# Patient Record
Sex: Female | Born: 1935 | Race: White | Hispanic: No | Smoking: Former smoker
Health system: Southern US, Community
[De-identification: ages and names within clinical notes are randomized; demographics above are authoritative.]

## PROBLEM LIST (undated history)

## (undated) DIAGNOSIS — H9191 Unspecified hearing loss, right ear: Secondary | ICD-10-CM

## (undated) DIAGNOSIS — M5030 Other cervical disc degeneration, unspecified cervical region: Secondary | ICD-10-CM

## (undated) DIAGNOSIS — R112 Nausea with vomiting, unspecified: Secondary | ICD-10-CM

## (undated) DIAGNOSIS — Z8719 Personal history of other diseases of the digestive system: Secondary | ICD-10-CM

## (undated) DIAGNOSIS — C801 Malignant (primary) neoplasm, unspecified: Secondary | ICD-10-CM

## (undated) DIAGNOSIS — E119 Type 2 diabetes mellitus without complications: Secondary | ICD-10-CM

## (undated) DIAGNOSIS — R42 Dizziness and giddiness: Secondary | ICD-10-CM

## (undated) DIAGNOSIS — F419 Anxiety disorder, unspecified: Secondary | ICD-10-CM

## (undated) DIAGNOSIS — K219 Gastro-esophageal reflux disease without esophagitis: Secondary | ICD-10-CM

## (undated) DIAGNOSIS — J449 Chronic obstructive pulmonary disease, unspecified: Secondary | ICD-10-CM

## (undated) DIAGNOSIS — M1990 Unspecified osteoarthritis, unspecified site: Secondary | ICD-10-CM

## (undated) DIAGNOSIS — Z9889 Other specified postprocedural states: Secondary | ICD-10-CM

## (undated) DIAGNOSIS — F329 Major depressive disorder, single episode, unspecified: Secondary | ICD-10-CM

## (undated) DIAGNOSIS — Z972 Presence of dental prosthetic device (complete) (partial): Secondary | ICD-10-CM

## (undated) DIAGNOSIS — F32A Depression, unspecified: Secondary | ICD-10-CM

## (undated) DIAGNOSIS — I10 Essential (primary) hypertension: Secondary | ICD-10-CM

## (undated) DIAGNOSIS — G709 Myoneural disorder, unspecified: Secondary | ICD-10-CM

## (undated) DIAGNOSIS — I509 Heart failure, unspecified: Secondary | ICD-10-CM

## (undated) DIAGNOSIS — E7800 Pure hypercholesterolemia, unspecified: Secondary | ICD-10-CM

## (undated) DIAGNOSIS — IMO0001 Reserved for inherently not codable concepts without codable children: Secondary | ICD-10-CM

## (undated) HISTORY — PX: SHOULDER SURGERY: SHX246

## (undated) HISTORY — PX: BACK SURGERY: SHX140

## (undated) HISTORY — PX: CHOLECYSTECTOMY: SHX55

## (undated) HISTORY — PX: APPENDECTOMY: SHX54

## (undated) HISTORY — PX: JOINT REPLACEMENT: SHX530

---

## 1961-06-14 HISTORY — PX: ABDOMINAL HYSTERECTOMY: SHX81

## 2000-01-21 ENCOUNTER — Encounter: Payer: Self-pay | Admitting: Neurosurgery

## 2000-01-26 ENCOUNTER — Inpatient Hospital Stay (HOSPITAL_COMMUNITY): Admission: RE | Admit: 2000-01-26 | Discharge: 2000-01-27 | Payer: Self-pay | Admitting: Neurosurgery

## 2000-01-26 ENCOUNTER — Encounter: Payer: Self-pay | Admitting: Neurosurgery

## 2000-03-21 ENCOUNTER — Encounter: Payer: Self-pay | Admitting: Neurosurgery

## 2000-03-21 ENCOUNTER — Encounter: Admission: RE | Admit: 2000-03-21 | Discharge: 2000-03-21 | Payer: Self-pay | Admitting: Neurosurgery

## 2000-07-05 ENCOUNTER — Inpatient Hospital Stay (HOSPITAL_COMMUNITY): Admission: RE | Admit: 2000-07-05 | Discharge: 2000-07-07 | Payer: Self-pay | Admitting: Neurosurgery

## 2000-07-05 ENCOUNTER — Encounter: Payer: Self-pay | Admitting: Neurosurgery

## 2000-08-08 ENCOUNTER — Encounter: Admission: RE | Admit: 2000-08-08 | Discharge: 2000-08-08 | Payer: Self-pay | Admitting: Neurosurgery

## 2000-08-08 ENCOUNTER — Encounter: Payer: Self-pay | Admitting: Neurosurgery

## 2001-02-08 ENCOUNTER — Other Ambulatory Visit: Admission: RE | Admit: 2001-02-08 | Discharge: 2001-02-08 | Payer: Self-pay | Admitting: Family Medicine

## 2004-07-09 ENCOUNTER — Ambulatory Visit: Payer: Self-pay | Admitting: Unknown Physician Specialty

## 2004-08-05 ENCOUNTER — Ambulatory Visit: Payer: Self-pay | Admitting: Orthopedic Surgery

## 2004-10-09 ENCOUNTER — Ambulatory Visit: Payer: Self-pay | Admitting: Unknown Physician Specialty

## 2004-12-28 ENCOUNTER — Ambulatory Visit: Payer: Self-pay | Admitting: Gastroenterology

## 2005-01-01 ENCOUNTER — Ambulatory Visit: Payer: Self-pay | Admitting: Gastroenterology

## 2005-02-11 ENCOUNTER — Ambulatory Visit: Payer: Self-pay | Admitting: Orthopedic Surgery

## 2005-02-11 ENCOUNTER — Other Ambulatory Visit: Payer: Self-pay

## 2005-02-17 ENCOUNTER — Ambulatory Visit: Payer: Self-pay | Admitting: Orthopedic Surgery

## 2005-06-02 ENCOUNTER — Ambulatory Visit: Payer: Self-pay | Admitting: Family Medicine

## 2005-07-24 ENCOUNTER — Emergency Department: Payer: Self-pay | Admitting: Emergency Medicine

## 2005-07-24 ENCOUNTER — Other Ambulatory Visit: Payer: Self-pay

## 2006-02-08 ENCOUNTER — Ambulatory Visit: Payer: Self-pay | Admitting: Gastroenterology

## 2006-03-04 ENCOUNTER — Ambulatory Visit: Payer: Self-pay | Admitting: Surgery

## 2006-03-11 ENCOUNTER — Inpatient Hospital Stay: Payer: Self-pay | Admitting: Surgery

## 2006-06-03 ENCOUNTER — Ambulatory Visit: Payer: Self-pay | Admitting: Unknown Physician Specialty

## 2006-09-12 ENCOUNTER — Ambulatory Visit: Payer: Self-pay | Admitting: Orthopedic Surgery

## 2006-09-30 ENCOUNTER — Ambulatory Visit: Payer: Self-pay | Admitting: Gastroenterology

## 2006-10-04 ENCOUNTER — Emergency Department: Payer: Self-pay | Admitting: General Practice

## 2006-10-24 ENCOUNTER — Ambulatory Visit: Payer: Self-pay | Admitting: Orthopedic Surgery

## 2006-10-24 ENCOUNTER — Other Ambulatory Visit: Payer: Self-pay

## 2006-11-04 ENCOUNTER — Ambulatory Visit (HOSPITAL_COMMUNITY): Admission: RE | Admit: 2006-11-04 | Discharge: 2006-11-04 | Payer: Self-pay | Admitting: *Deleted

## 2006-11-09 ENCOUNTER — Inpatient Hospital Stay: Payer: Self-pay | Admitting: Orthopedic Surgery

## 2006-11-30 ENCOUNTER — Ambulatory Visit: Payer: Self-pay | Admitting: Orthopedic Surgery

## 2007-06-20 ENCOUNTER — Ambulatory Visit: Payer: Self-pay | Admitting: Family Medicine

## 2008-06-20 ENCOUNTER — Ambulatory Visit: Payer: Self-pay | Admitting: Otolaryngology

## 2009-05-21 ENCOUNTER — Inpatient Hospital Stay (HOSPITAL_COMMUNITY): Admission: RE | Admit: 2009-05-21 | Discharge: 2009-05-24 | Payer: Self-pay | Admitting: Orthopedic Surgery

## 2009-09-03 ENCOUNTER — Encounter: Admission: RE | Admit: 2009-09-03 | Discharge: 2009-09-03 | Payer: Self-pay | Admitting: Orthopedic Surgery

## 2010-03-31 ENCOUNTER — Ambulatory Visit: Payer: Self-pay

## 2010-04-24 ENCOUNTER — Ambulatory Visit: Payer: Self-pay | Admitting: Unknown Physician Specialty

## 2010-05-08 ENCOUNTER — Ambulatory Visit: Payer: Self-pay | Admitting: Specialist

## 2010-05-18 ENCOUNTER — Ambulatory Visit: Payer: Self-pay | Admitting: Specialist

## 2010-05-20 ENCOUNTER — Ambulatory Visit: Payer: Self-pay | Admitting: Specialist

## 2010-07-03 ENCOUNTER — Emergency Department: Payer: Self-pay | Admitting: Emergency Medicine

## 2010-09-15 LAB — BASIC METABOLIC PANEL
BUN: 9 mg/dL (ref 6–23)
Calcium: 8 mg/dL — ABNORMAL LOW (ref 8.4–10.5)
Calcium: 8.4 mg/dL (ref 8.4–10.5)
Calcium: 8.6 mg/dL (ref 8.4–10.5)
Chloride: 101 mEq/L (ref 96–112)
Creatinine, Ser: 0.78 mg/dL (ref 0.4–1.2)
Creatinine, Ser: 0.89 mg/dL (ref 0.4–1.2)
GFR calc Af Amer: >60 mL/min (ref >60)
GFR calc Af Amer: >60 mL/min (ref >60)
GFR calc non Af Amer: >60 mL/min (ref >60)
GFR calc non Af Amer: >60 mL/min (ref >60)
GFR calc non Af Amer: >60 mL/min (ref >60)
Glucose, Bld: 135 mg/dL — ABNORMAL HIGH (ref 70–99)
Potassium: 3.9 mEq/L (ref 3.5–5.1)
Sodium: 137 mEq/L (ref 135–145)
Sodium: 138 mEq/L (ref 135–145)

## 2010-09-15 LAB — CBC
HCT: 32.3 % — ABNORMAL LOW (ref 36.0–46.0)
HCT: 43.3 % (ref 36.0–46.0)
Hemoglobin: 10.6 g/dL — ABNORMAL LOW (ref 12.0–15.0)
Hemoglobin: 10.7 g/dL — ABNORMAL LOW (ref 12.0–15.0)
Hemoglobin: 13.8 g/dL (ref 12.0–15.0)
Platelets: 146 10*3/uL — ABNORMAL LOW (ref 150–400)
Platelets: 169 10*3/uL (ref 150–400)
Platelets: 178 10*3/uL (ref 150–400)
RBC: 3.69 MIL/uL — ABNORMAL LOW (ref 3.87–5.11)
RDW: 14.2 % (ref 11.5–15.5)
WBC: 10.8 10*3/uL — ABNORMAL HIGH (ref 4.0–10.5)
WBC: 12.3 10*3/uL — ABNORMAL HIGH (ref 4.0–10.5)
WBC: 9.1 10*3/uL (ref 4.0–10.5)

## 2010-09-15 LAB — PROTIME-INR
INR: 1.11 (ref 0.00–1.49)
INR: 1.21 (ref 0.00–1.49)
INR: 1.45 (ref 0.00–1.49)
INR: 2.01 — ABNORMAL HIGH (ref 0.00–1.49)
Prothrombin Time: 14.2 seconds (ref 11.6–15.2)
Prothrombin Time: 15.2 seconds (ref 11.6–15.2)
Prothrombin Time: 17.5 seconds — ABNORMAL HIGH (ref 11.6–15.2)

## 2010-09-15 LAB — COMPREHENSIVE METABOLIC PANEL
Alkaline Phosphatase: 91 U/L (ref 39–117)
BUN: 15 mg/dL (ref 6–23)
Chloride: 100 mEq/L (ref 96–112)
Glucose, Bld: 103 mg/dL — ABNORMAL HIGH (ref 70–99)
Potassium: 4.7 mEq/L (ref 3.5–5.1)
Total Bilirubin: 0.8 mg/dL (ref 0.3–1.2)

## 2010-09-15 LAB — TYPE AND SCREEN

## 2010-09-15 LAB — URINALYSIS, ROUTINE W REFLEX MICROSCOPIC
Glucose, UA: NEGATIVE mg/dL
Hgb urine dipstick: NEGATIVE
Specific Gravity, Urine: 1.014 (ref 1.005–1.030)
Urobilinogen, UA: 0.2 mg/dL (ref 0.0–1.0)

## 2010-09-15 LAB — URINE MICROSCOPIC-ADD ON

## 2010-10-27 NOTE — Cardiovascular Report (Signed)
[name], [name]               ACCOUNT NO.:  [id number]   MEDICAL RECORD NO.:  [id number]          PATIENT TYPE:  OIB   LOCATION:  2857                         FACILITY:  [facility]   PHYSICIAN:  Darlin Priestly, MD  DATE OF BIRTH:  1933-01-30   DATE OF PROCEDURE:  11/04/2006  DATE OF DISCHARGE:                            CARDIAC CATHETERIZATION   PROCEDURE:  1. Left heart catheterization.  2. Coronary arteriogram.  3. Left ventriculogram.  4. Abdominal aortogram.   COMPLICATIONS:  None.   INDICATIONS:  [name] is a 77 year old female patient of Dr. Sandford Craze and Dr. Kennith Center, with a history of left leg pain, now in need of  left hip replacement.  She did undergo a Cardiolite scan for pre-  operative clearance, which suggested mild anterior ischemia.  She is now  referred for cardiac catheterization to rule out CAD, prior to  proceeding with her hip surgery.   DESCRIPTION OF OPERATION:  After gaining informed written consent, the  patient was brought to the cardiac cath lab.  Right groin was shaved,  prepped and draped in sterile fashion.  ECG monitoring was established.  Using modified Seldinger technique, a #6-French arterial sheath  __________  femoral artery.  A 6-French diagnostic catheters were  performed, diagnostic angiography __________ .   The left main is a large vessel with no significant disease.   The LAD is a large vessel that courses __________  to one diagonal  branch.  The LAD is a tortuous vessel.  There is mild 20% ostial  narrowing.  The remainder of the LAD has no significant disease.   The first diagonal is a small to medium-size vessel with no significant  disease.   The left circumflex is a medium-size vessel which is also a tortuous  vessel that gives off two obtuse marginal branches.  The AV circumflex  has no disease.   First and second OMs are small to medium-size vessels with no  significant  disease.    The right coronary is a  large vessel, was dominant __________  PDA, as  well as posterolateral branch.  There was no significant disease in the  RCA, PD or posterolateral branch.   Left ventriculogram reveals a normal EF at 70%.   Abdominal aortogram reveals no significant renal artery stenosis.   HEMODYNAMIC RESULTS:  Systemic arterial pressure 193/94, LV systemic  pressure 197/18, LVEDP of 28.   CONCLUSION:  1. No significant CAD.  2. Normal LV systolic function.  3. No evidence of renal artery stenosis.  4. Systemic hypertension.  5. Elevated LVEDP.      Darlin Priestly, MD  Electronically Signed     RHM/MEDQ  D:  11/04/2006  T:  11/04/2006  Job:  [id number]   cc:   Karsten Fells

## 2010-10-30 NOTE — Op Note (Signed)
[place]. [facility]  Patient:    [name], [name]                        MRN: [id number] Proc. Date: 07/05/00 Attending:  Clydene Fake, M.D.                           Operative Report  PREOPERATIVE DIAGNOSES: 1. Cervical stenosis. 2. Myelopathy. 3. Left C6 and C7 radiculopathy.  POSTOPERATIVE DIAGNOSIS: 1. Cervical stenosis. 2. Myelopathy. 3. Left C6 and C7 radiculopathy.  OPERATION: 1. Inferior C3 through superior C7 decompressive laminectomy (four levels). 2. C5-C6 and C6-C7 left foraminotomies. 3. Microdissection with microscope. 4. Posterior fusion C4 through C7 (three levels). 5. Segmented posterior instrumentation C4 through C7 (lateral mass screws and    rods). 6. Autograft same incision.  SURGEON:  Clydene Fake, M.D.  ASSISTANT:  Izell [place]ing[place]. Elesa Hacker, M.D.  ANESTHESIA:  General endotracheal anesthesia.  ESTIMATED BLOOD LOSS:  250 cc.  Blood given: None.  DRAINS:  None.  COMPLICATIONS:  None.  REASON FOR PROCEDURE:  The patient is five months status post two-level ACF for myelopathy and cord compression at 4-5 and 5-6, who, a few months after surgery had recurrent left arm pain and was stable in her myelopathy.  MRI was done showing continued cord compression C4 through C6 to 7 with spondylosis at 6-7 and foraminal narrowing still at left 5-6 and at left 6-7.  The patient is brought in for decompression fusion posteriorly.  DESCRIPTION OF PROCEDURE:  The patient is brought to the operating room. General anesthesia was induced.  The patient was placed in a [place] head rest and placed in a prone position  with all pressure points padded.  The patient was prepped and draped in a sterile fashion.  The site of the incision was injected with 10 cc of 1% lidocaine with epinephrine. Incision was then made in the midline of the cervical spine.  Incision was taken from the bottom of C2 through the bottom of C7.  Incision was taken down to  the fascia with care to stay in the midline.  Hemostasis was obtained with Bovie cauterization.  The fascia was then incised, and subperiosteal dissection was done over the C3, 4, 5, 6, and 7 disk space and lamina, and then dissection taken to the facets through these same levels bilaterally. Self-retaining retractor system then placed.  Fluoroscopic images were used to confirm levels.  Microscope was brought onto the field at this point for microdissection foraminotomies.  High-speed drill was used to drill the lamina and the facets of 5-6 and 6-7; 1 and 2 mm Kerrison punches were used to then complete this small laminotomy and medial facetectomy, performing a foraminotomy over the left C6 and left C7 roots.  The vascular membrane over the roots was coagulated with bipolar cauterization and then cut with microscissors, and then we were easily able to pass a nerve hook out the foramen with the nerve root at both levels after this decompression.  After this, attention was then taken to full lamina, and laminectomy was performed with the bottom of C3 removed, all of C4, 5, 6, and the top of C7 removed with Leksell rongeurs and Kerrison punches.  We carefully went down the lateral edges on both sides, decompressing the thecal sac.  Hemostasis obtained with Gelfoam and thrombin in the bone edges and epidural space.  This was then removed.  After the decompression, the microscope was removed from the field.  The facets at 4-5, 5-6, and 6-7 were then prepared by drilling out the facets, removing the cartilage, and using curets to do this also.  Then, the holes for the lateral mass screws were then made by first using an awl to get through the cortex, and then using a drill and drill guide and drilling into the lateral mass at C4, 5, 6, and 7 on the right side and C4, 6, and 7 on the left side.  Due to the foraminotomy, there was not going to be good purchase for a C5 screw on the left.  After  drilling each of the holes, each of the holes were tapped, and then the segmented systems was used and lateral mass screws placed; 14 mm screws were placed to C4 and 12 mm screws placed in the rest.  After the screws were placed, a 60 mm rod was used on both sides.  A slight bend was placed in the left one.  The rods were placed into some screw heads, and nuts were placed over the screws, tightening the rod to the screws.  The tightener was then used again in torque to tighten down the nuts on the rod system.  Prior to placement of the rods in the screw heads, autograft bone was removed from the laminectomy.  It was cleaned and chopped into very small pieces and then packed into each of the facets bilaterally at 4-5, 5-6, and 6-7.  After the rods were placed and nuts tightened, the rest of the bone was placed over the lateral masses lateral to the rods.  Retractors then removed, and hemostasis obtained with bipolar cauterization.  Gelfoam was placed on the dural edges.  The paraspinous muscles and fascia were closed with 0 Vicryl interrupted suture.  The subcutaneous tissue was closed with 0, 2-0 and 3-0 Vicryl interrupted suture, and the skin closed with Steri-Strips.  Dressing was placed.  The patient was placed back into a supine position, had the head band removed, had a soft cervical collar placed, and then was awakened from anesthesia and transferred to the recovery room. DD:  07/05/00 TD:  07/05/00 Job: [id number]

## 2010-10-30 NOTE — Discharge Summary (Signed)
[place]. [facility]  Patient:    [name], [name]                        MRN: [id number] Adm. Date:  07/05/00 Disc. Date: 07/07/00 Attending:  Clydene Fake, M.D.                           Discharge Summary  ADMISSION DIAGNOSIS:  Cervical stenosis causing myelopathy and left C6-7 radiculopathy.  DISCHARGE DIAGNOSIS:  Cervical stenosis causing myelopathy and left C6-7 radiculopathy.  PROCEDURES:  C3-7 decompressive laminectomy with left C5-6 and C6-7 foraminotomies, microdissection with microscope, posterior fusion of C4-7, segmental posterior instrumentation (lateral ______ screws and rods) C4-7, autograft from same incision.  REASON FOR ADMISSION:  The patient is a 77 year old woman who five months ago underwent LACF for cord compression.  She had done well with stable myelopathy and improved radicular symptoms until a couple of months ago when she had left arm pain and came back.  Work-up showed good fusion anteriorly and good anterior decompression of the C4-5 and C5-6 levels, but still cord compression there along with spondylosis at C6-7 and left-sided foraminal narrowing at C5-6 and C6-7.  The patient was brought in for decompression and fusion surgery.  HOSPITAL COURSE:  The patient was admitted on the day of surgery and underwent the procedure named above without complications.  Postoperatively the patient was transferred to the recovery room and then to the floor.  There she was wearing her cervical collar and had some drainage on the dressing.  The incisions otherwise looked good.  She has been up ambulating, but the first day every time she was up, she would get nauseous and actually vomiting.  The morphine PCA was discontinued and these symptoms resolved.  She was ambulating and starting to each better by the second postoperative day.  She did notice minimal pain when lying down.  She had some increased pain if she was ambulating.  There  was also some increased spasm which also caused some achiness into her arms, but not the same type of pain she had down her left arm.  The preoperative back pain has resolved and the numbness she had down her left arm is mildly improved.  DISPOSITION:  The patient will be discharged home in stable condition on July 07, 2000.  DISCHARGE MEDICATIONS:  Same as prehospitalization plus Darvocet-N 100 one to two p.o. q.4-6h. (51 given) and Valium 5 mg p.o. q.6h. p.r.n. spasms.  She also has some Flexeril at home that she can take on a p.r.n. basis.  No NSAIDs for three weeks.  DIET:  As tolerated.  ACTIVITY:  C-collar on at all times.  WOUND CARE:  Keep the incision dry for five days and then may shower.  FOLLOW-UP:  Two weeks in my office. DD:  07/07/00 TD:  07/07/00 Job: [id number]

## 2010-10-30 NOTE — H&P (Signed)
[place]. [facility]  Patient:    [name], [name]                        MRN: [id number] Adm. Date:  01/26/00 Attending:  Clydene Fake, M.D.                         History and Physical  CHIEF COMPLAINT:  Left arm numbness.  HISTORY OF PRESENT ILLNESS:  The patient is a 77 year old woman who for about two months had pain radiating down her left arm toward her middle fingers that has been worsening despite chiropractic care.  She says there is some pain in the interscapular area.  She can bring the numbness on with extension of her neck and bending of her head to the left side.  There are no right-sided symptoms.  She has noticed over the last few months that movement of her hands is getting slower and sloppier, trouble putting jewelry on, but no real change in handwriting, no problems with gait.  MRI of the cervical spine done showing cervical cord stenosis at 4-5 with spondylitic change causing most of the problems there, and at 5-6 some mild stenosis but also left-sided disk herniation with foraminal narrowing.  The patient is being admitted for two-level anterior cervical diskectomy and fusion.  PAST MEDICAL HISTORY:  Significant for increased cholesterol but not on medications for this.  PAST SURGICAL HISTORY:  Hysterectomy.  MEDICATIONS:  Glucosamine, Tylenol.  She has also been taking Voltaren p.r.n.  ALLERGIES:  No known drug allergies.  SOCIAL HISTORY:  She is widowed, does office work.  Quit smoking six months ago.  Does not drink alcohol.  REVIEW OF SYSTEMS:  Negative.  FAMILY HISTORY:  Noncontributory.  PHYSICAL EXAMINATION:  GENERAL:  The patient is alert and oriented x 3, very pleasant.  VITAL SIGNS:  Weight 258.  Height 5 feet 4 inches.  Blood pressure 140/85, pulse 64.  HEENT:  Unremarkable.  LUNGS:  Clear.  HEART:  Regular rate and rhythm.  ABDOMEN:  Soft, nontender.  EXTREMITIES:  Intact, no edema.  NECK AND  NEUROLOGIC EXAM:  The neck extends well in all directions.  There is some tenderness in the left side with some paraspinous muscle spasms on the left.  Positive Spurlings to the left, negative on the right.  Negative Lhermittes.  No brachial plexus tenderness.  There is no pronator drift. Sensation shows some decreased sensation in the left C6 distribution to light touch and pinprick.  Deep tendon reflexes are increased to 3+/4 in the upper extremities, though they are 2-2+/4 in the lower extremities.  No clonus in the lower extremities.  Rapid alternating movements are slow and sloppy.  Gait is fairly normal, but tandem gait showing to about three steps.  Motor strength is intact in all motor groups.  ASSESSMENT:  Patient with myelopathic and radicular symptoms.  Will be admitted for 4-5 and 5-6 anterior cervical diskectomy and fusion. DD:  01/26/00 TD:  01/26/00 Job: [id number]

## 2010-10-30 NOTE — Op Note (Signed)
[place]. [facility]  Patient:    [name]                      MRN: [id number] Proc. Date: 01/26/00 Adm. Date:  [id number] Attending:  Colon Branch                           Operative Report  PREOPERATIVE DIAGNOSIS:  Spondylosis and herniated nucleus pulposus causing myelopathy and radiculopathy at C4-C5 and C5-C6.  POSTOPERATIVE DIAGNOSIS:  Spondylosis and herniated nucleus pulposus causing myelopathy and radiculopathy at C4-C5 and C5-C6.  OPERATION:  Anterior cervical diskectomy and fusion C4-C5 and C5-C6 with allograft anterior cervical plate and tong traction.  SURGEON:  Clydene Fake, M.D.  ASSISTANT:  Izell Grimsley. Elesa Hacker, M.D.  ANESTHESIA: General endotracheal tube anesthesia.  ESTIMATED BLOOD LOSS:  Minimal.  BLOOD GIVEN:  None.  DRAINS:  None.  COMPLICATIONS:  None.  INDICATIONS:  The patient is a 77 year old woman who has had a few month history of neck pain with pain going down her left arm to her thumb and middle fingers with some slow and sloppy movements with her upper extremity.  The MRI showed cord stenosis at L4-L5 due to spondylitic change and disk herniation and left foraminal narrowing at C5-C6.  The patient was brought in for surgery.  DESCRIPTION OF PROCEDURE:  The patient was brought into the operating room and general anesthesia was induced.  The patient had Gardner-Wells placed for traction during the case.  She was then prepped and draped in the sterile fashion.  The site of the incision was injected with 9 cc of 1% lidocaine with epinephrine.  The incision was then made down the left side of the neck from the anterior border of the sternocleidomastoid muscle to the midline. Incision was taken down to the platysma and hemostasis obtained with Bovie cautery. The platysma was incised with the Bovie and then blunt dissection taken down to the anterior cervical fascia to the cervical spine.  A self-retaining ______  was placed in the disk space, and x-ray obtained showing this was the C4-C5 interspace.  The space was then incised with a 15 blade and anterior diskectomy performed with pituitary rongeur.  The longus coli muscle was then reflected laterally on each side after mobilizing with the Bovie over the C4-C5 and C5-C6 interspaces.  The self-retaining retractor system was then placed.  Disk space at C4-C5 and C5-C6 was then incised again with 15 blade and diskectomy performed with pituitary rongeur.  A drill guide was placed over the C4-C5 interspace and a 10 mm Cloward drill was used to drill down through the interspace and end-plate leaving a thinner posterior cortex.  This was then repeated at the C5-C6 level.  At this point, a microscope was brought into the field for microdissection.  The 1 and 2 mm Kerrison punches were used along with curets to finish the diskectomy an remove the thin rim of posterior cortex of the posterior longitudinal ligament and decompress the cord to perform bilateral foraminotomies all under microscopic dissection techniques.  First this was done at the C4-C5 and then the C5-C6 levels.  After this the spinal cord and foramen at both levels were decompressed.  The depth of the vertebral body was measured with the depth gauge and two 12 mm bone dowels were then cut to be a few mm shorter in size. With traction and the Gardner-Wells tongs, the  bone plug was tapped into the C5-C6 space and then this was repeated for the C4-C5 interspace.  Nerve hook was used to fill behind the bone plug making sure there between the dura and the bone plug.  The microscope was removed from the field at this point.  The length from C4 through C6 was measured.  The anterior cervical plate was placed over the cervical spine with two screws placed in the C4 and two in the C6.  X-ray was obtained showing good position of plates, screws and grafts.  The wound was irrigated with antibiotic  solution.  The retractor system was removed. Gelfoam was used for irrigation and then irrigated out.  The platysma was then closed with 3-0 Vicryl interrupted suture.  The subcutaneous tissue was closed with the same and the skin was then closed with Steri-Strips.  Dressing was placed and a soft cervical collar was placed.  The patients head gear had been removed. She was awakened from anesthesia and taken to the recovery room in stable condition. DD:  01/26/00 TD:  01/27/00 Job: [id number]

## 2010-10-30 NOTE — Discharge Summary (Signed)
. [facility]  Patient:    [name], [name]                      MRN: [id number] Adm. Date:  [id number] Disc. Date: [id number] Attending:  Colon Branch                           Discharge Summary  DIAGNOSIS:  Spondylosis and herniated nucleus pulposus at C4-5 and 5-6 with myelopathy and radiculopathy.  PROCEDURE:  Anterior cervical discectomy and fusion C4-5 and 5-6 with Allograft anterior cervical plate and tong traction.  REASON FOR ADMISSION:  The patient is a 77 year old woman who has had a two month history of pain radiating down her left arm toward her middle finger. It has been worsening.  MRI showed cord stenosed and spondylitic change and disc herniation at 5-6 on the left side.  The patient has been worsening in symptoms and no improvement with nonsurgical management.  She is admitted for surgery.  HOSPITAL COURSE:  The patient was admitted on the day of the procedure and underwent the above procedure without complications.  Postoperatively the patient was transferred to the recovery room and onto the floor.  There, she had left arm pain and a brief episode of some numbness into her fingers that resolved but she had significant nausea and emesis throughout the night and into the early morning.  The patient was observed throughout the morning and early afternoon and improved.  She started ambulating, getting around pretty good, able to eat and keep that down. She has had minimal to no pain.  She is not taking any pain medications.  Her incision was clean, dry and intact.  She is wearing a cervical collar.  DISPOSITION:  The patient will be discharged to home in stable condition.  DISCHARGE MEDICATIONS:  These are the same has prehospitalization except no NSAIDs for three weeks and Darvocet-N 100 22 p.o. q.4-6h. p.r.n. 41 given.  DIET:  As tolerated.  WOUND CARE:  Keep incision dry for five days.  Wear C-collar at all times except when  showering.  FOLLOW-UP:  She will follow up in three weeks in my office. DD:  01/27/00 TD:  01/28/00 Job: [id number]

## 2010-10-30 NOTE — H&P (Signed)
[place]. [facility]  Patient:    [name], [name]                        MRN: [id number] Adm. Date:  07/05/00 Attending:  Clydene Fake, M.D.                         History and Physical  CHIEF COMPLAINT:  Left arm pain and numbness.  HISTORY:  The patient is a 77 year old woman who has had a C4-5 and C5-6 ACF done in August 2001, for left arm numbness.  She did great and had much decrease in pain and numbness until December, when she started having some recurrence of neck pain on the left side, with pain over the scapula and into the left arm again.  Steroid dosepak improved some of these symptoms, but a repeat MRI was done, showing a good fusion at C4-5 and C5-6, with no anterior compression, but there was still some canal stenosis with pressure on the spinal cord, due to the congenital narrow canal and the spondylosis at those levels.  At C6-7 there is a disk bulge, but no herniation, along with the canal stenosis, causing some compression.  There probably is also some foraminal stenosis on the left at C5-6 and C6-7, which may be causing her symptoms.  The patient will be brought in for a posterior decompression and fusion.  PAST MEDICAL HISTORY:  Significant for increased cholesterol.  PAST SURGICAL HISTORY: 1. The two-level ACF as mentioned above. 2. Hysterectomy in the past.  CURRENT MEDICATIONS: 1. Glucosamine. 2. Tylenol p.r.n. 3. Voltaren p.r.n. 4. Amitriptyline 50 mg q.h.s. 5. Flexeril p.r.n.  ALLERGIES:  No known drug allergies.  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  She does not drink alcohol.  REVIEW OF SYSTEMS:  Otherwise negative.  FAMILY HISTORY:  Noncontributory.  PHYSICAL EXAMINATION:  GENERAL:  The patient is pleasant, in some mild distress.  HEENT:  Unremarkable.  LUNGS:  Clear.  HEART:  A regular rhythm.  ABDOMEN:  Soft, nontender.  EXTREMITIES:  Intact.   No edema.  NECK/NEUROLOGIC:  Some decreased range of motion, secondary to neck pain.  She has a positive Spurlings to the left, negative to the right.  Some paraspinous muscle spasms noted.  Her _______ movements are slow and sloppy bilaterally. Sensation decreased in the left C7 distribution.  There is no pronator drift.  No brachial plexus tenderness.  Motor strength is intact in all muscle groups, including the left triceps and wrist extensors.  ASSESSMENT/PLAN:  A patient with cervical stenosis and foraminal narrowing.  Will admit for a posterior decompression and fusion of the cervical spine. DD:  07/05/00 TD:  07/05/00 Job: [id number]

## 2010-11-13 ENCOUNTER — Ambulatory Visit: Payer: Self-pay | Admitting: Family Medicine

## 2011-01-06 ENCOUNTER — Other Ambulatory Visit: Payer: Self-pay | Admitting: Neurosurgery

## 2011-01-06 DIAGNOSIS — IMO0002 Reserved for concepts with insufficient information to code with codable children: Secondary | ICD-10-CM

## 2011-01-06 DIAGNOSIS — M545 Low back pain: Secondary | ICD-10-CM

## 2011-01-09 ENCOUNTER — Ambulatory Visit
Admission: RE | Admit: 2011-01-09 | Discharge: 2011-01-09 | Disposition: A | Discharge status: Home / Self Care | Payer: Medicare Other | Source: Ambulatory Visit | Attending: Neurosurgery | Admitting: Neurosurgery

## 2011-01-09 DIAGNOSIS — M545 Low back pain, unspecified: Secondary | ICD-10-CM

## 2011-01-09 DIAGNOSIS — IMO0002 Reserved for concepts with insufficient information to code with codable children: Secondary | ICD-10-CM

## 2011-01-31 IMAGING — CR DG HIP (WITH OR WITHOUT PELVIS) 2-3V*L*
3 series · 3 of 3 positions shown · non-contrast
Comparison: None

CLINICAL DATA: Left hip pain.  History of total left hip
arthroplasty.

LEFT HIP - COMPLETE 2+ VIEW

[t pelvis a.p.]
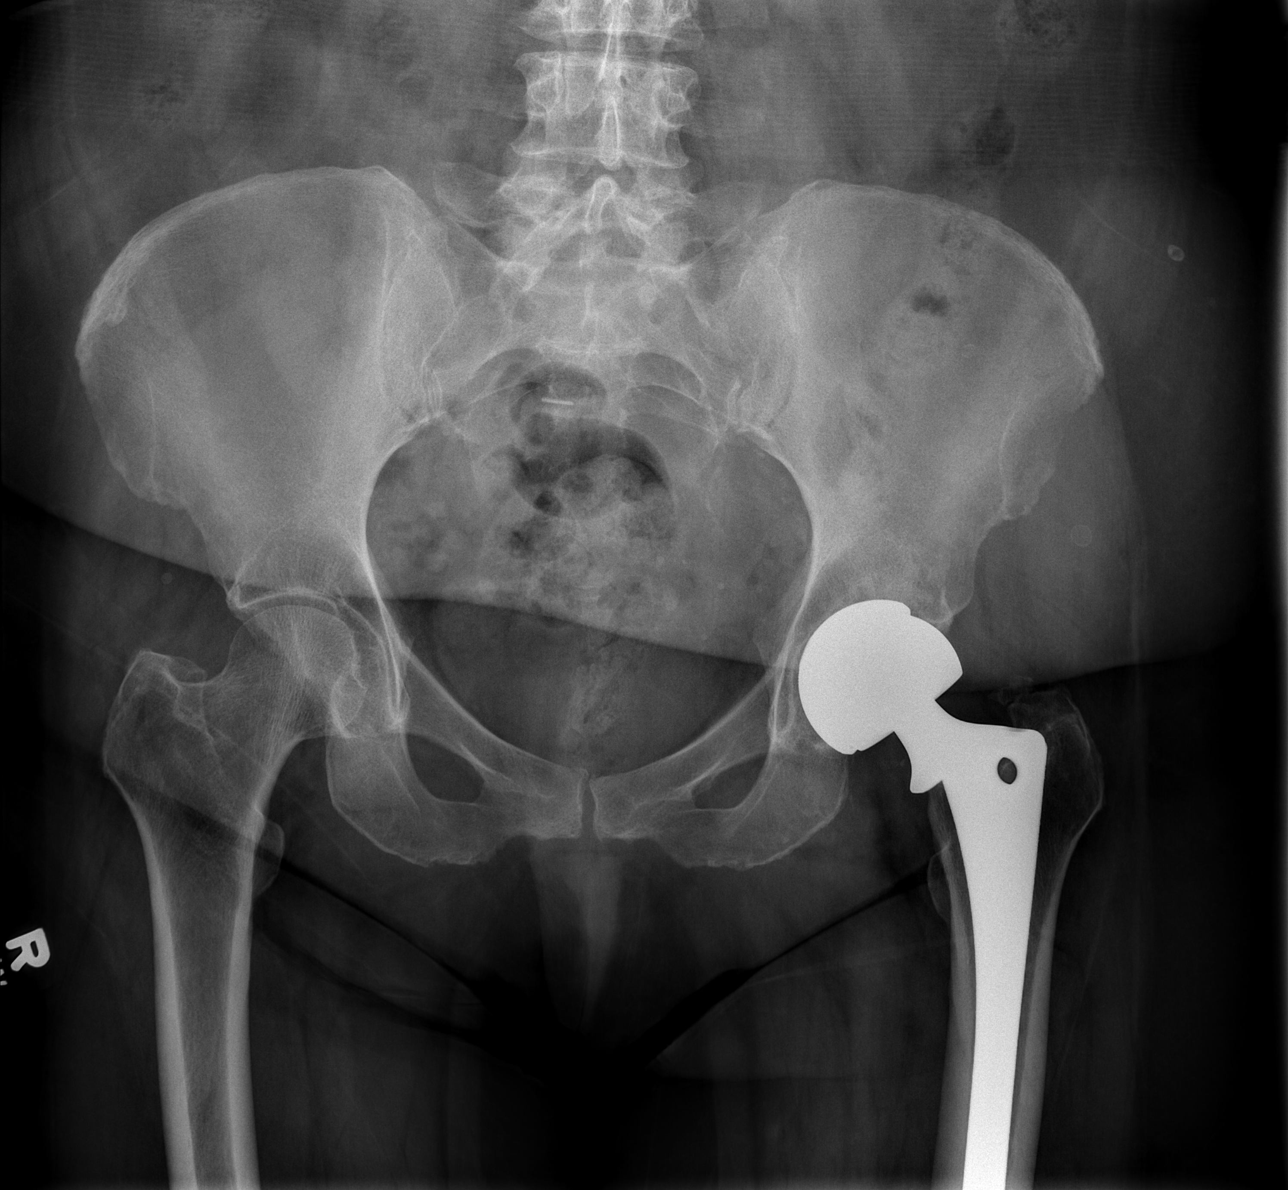

[t hip ap left]
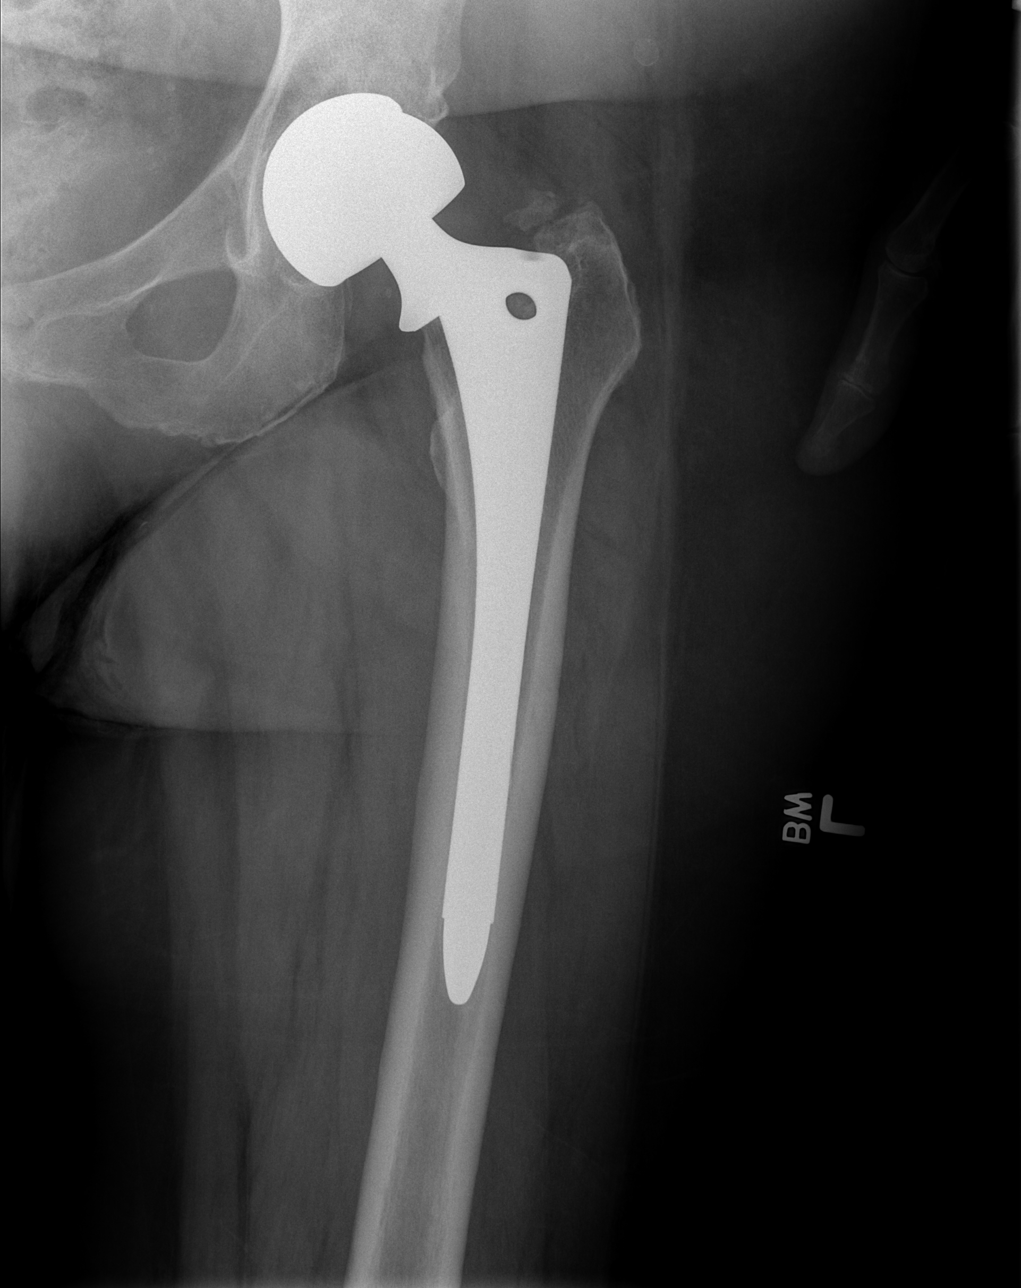

[t hip frog leg left]
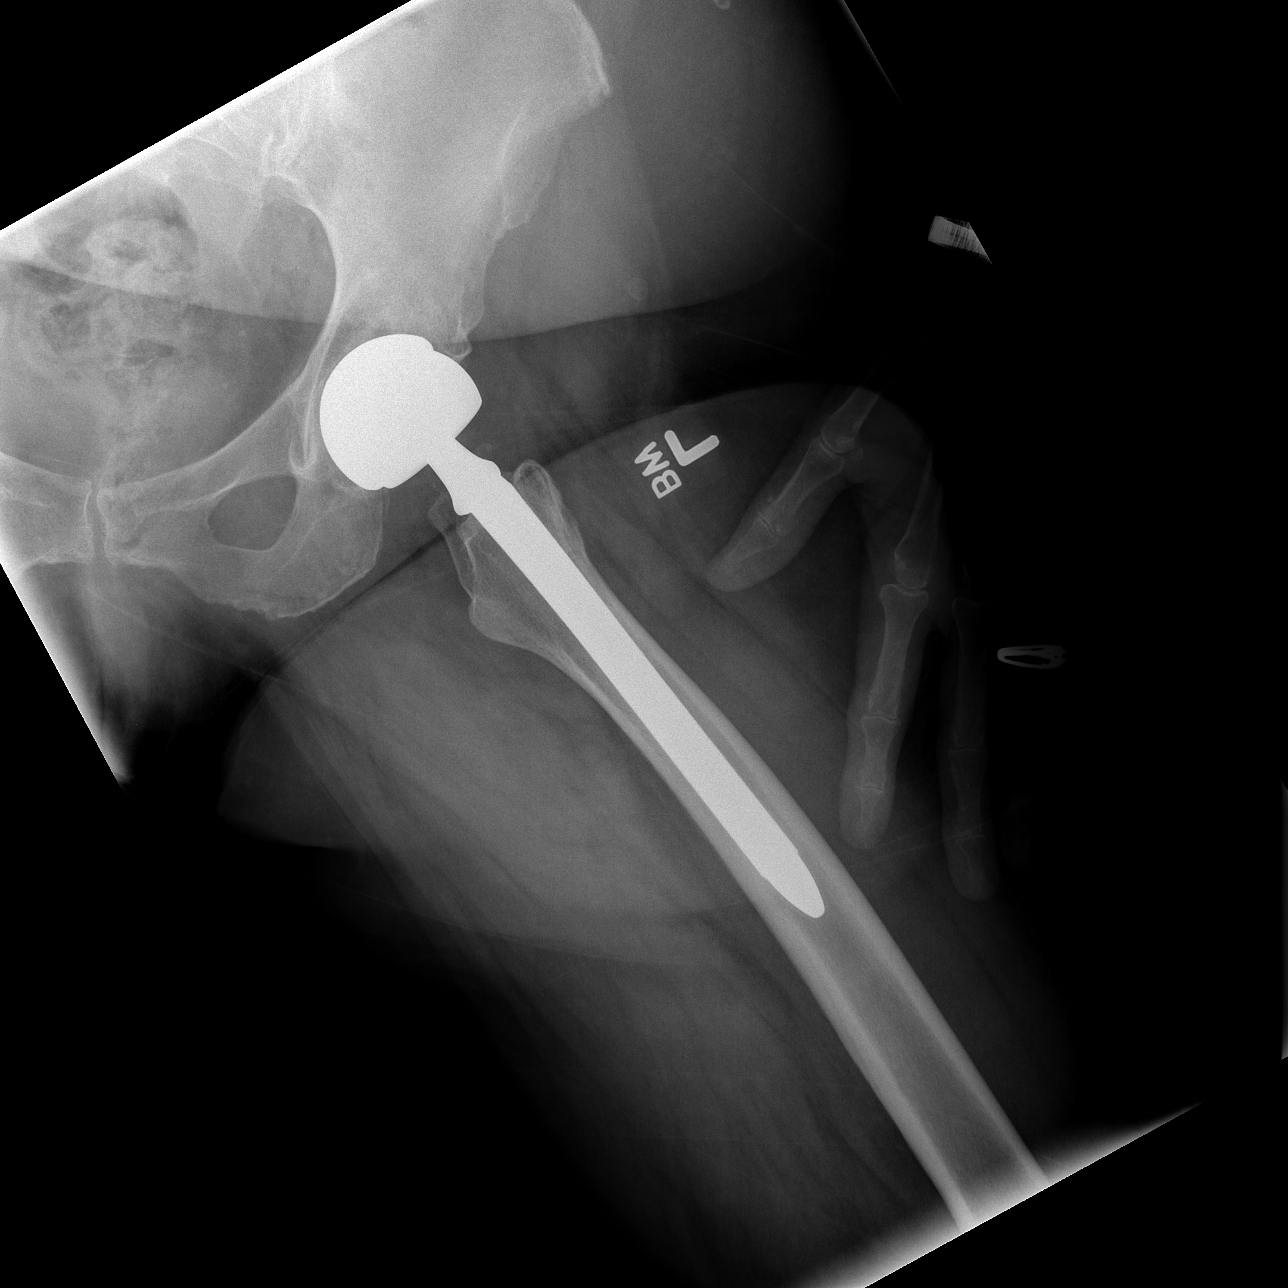

[3 of 3 positions shown; findings below may reference images not displayed]

FINDINGS: There are well seated appearing components of a total
left hip arthroplasty.  No plain film findings to suggest
loosening.  The pubic symphysis and SI joints are intact.  There
are minimal degenerative changes involving the right hip.
IMPRESSION: No complicating features associated with the left hip prosthesis
are demonstrated by plain films.

## 2011-02-08 ENCOUNTER — Ambulatory Visit: Payer: Self-pay | Admitting: Family Medicine

## 2011-02-12 ENCOUNTER — Ambulatory Visit: Payer: Self-pay | Admitting: Family Medicine

## 2011-03-10 ENCOUNTER — Ambulatory Visit: Payer: Self-pay | Admitting: Cardiology

## 2011-07-06 ENCOUNTER — Inpatient Hospital Stay: Payer: Self-pay | Admitting: Student

## 2011-07-06 LAB — CBC WITH DIFFERENTIAL/PLATELET
Basophil #: 0.1 10*3/uL (ref 0.0–0.1)
Basophil %: 0.5 %
Eosinophil #: 0.6 10*3/uL (ref 0.0–0.7)
HCT: 38.2 % (ref 35.0–47.0)
HGB: 12.5 g/dL (ref 12.0–16.0)
MCH: 28.4 pg (ref 26.0–34.0)
MCHC: 32.8 g/dL (ref 32.0–36.0)
Monocyte #: 0.7 10*3/uL (ref 0.0–0.7)
Neutrophil #: 8.7 10*3/uL — ABNORMAL HIGH (ref 1.4–6.5)
Neutrophil %: 73 %
RDW: 15.4 % — ABNORMAL HIGH (ref 11.5–14.5)

## 2011-07-06 LAB — URINALYSIS, COMPLETE
Bilirubin,UR: NEGATIVE
Ketone: NEGATIVE
Ph: 6 (ref 4.5–8.0)
Protein: NEGATIVE
RBC,UR: 1 /HPF (ref 0–5)
Specific Gravity: 1.009 (ref 1.003–1.030)
Squamous Epithelial: NONE SEEN
WBC UR: 14 /HPF (ref 0–5)

## 2011-07-06 LAB — COMPREHENSIVE METABOLIC PANEL
Albumin: 3.8 g/dL (ref 3.4–5.0)
Alkaline Phosphatase: 92 U/L (ref 50–136)
BUN: 31 mg/dL — ABNORMAL HIGH (ref 7–18)
Creatinine: 1.71 mg/dL — ABNORMAL HIGH (ref 0.60–1.30)
Glucose: 106 mg/dL — ABNORMAL HIGH (ref 65–99)
SGOT(AST): 21 U/L (ref 15–37)
SGPT (ALT): 31 U/L
Total Protein: 7.8 g/dL (ref 6.4–8.2)

## 2011-07-06 LAB — PROTIME-INR: INR: 1.1

## 2011-07-06 LAB — TROPONIN I
Troponin-I: <0.02 ng/mL
Troponin-I: <0.02 ng/mL

## 2011-07-07 LAB — CBC WITH DIFFERENTIAL/PLATELET
Eosinophil %: 4.8 %
HGB: 11.1 g/dL — ABNORMAL LOW (ref 12.0–16.0)
Lymphocyte #: 2.3 10*3/uL (ref 1.0–3.6)
Lymphocyte %: 23.7 %
Monocyte %: 7.3 %
Neutrophil #: 6.1 10*3/uL (ref 1.4–6.5)
Platelet: 205 10*3/uL (ref 150–440)
RDW: 14.9 % — ABNORMAL HIGH (ref 11.5–14.5)
WBC: 9.7 10*3/uL (ref 3.6–11.0)

## 2011-07-07 LAB — COMPREHENSIVE METABOLIC PANEL
Alkaline Phosphatase: 83 U/L (ref 50–136)
Bilirubin,Total: 0.4 mg/dL (ref 0.2–1.0)
Chloride: 107 mmol/L (ref 98–107)
Co2: 25 mmol/L (ref 21–32)
Creatinine: 1.52 mg/dL — ABNORMAL HIGH (ref 0.60–1.30)
EGFR (African American): 43 — ABNORMAL LOW
EGFR (Non-African Amer.): 35 — ABNORMAL LOW
Osmolality: 289 (ref 275–301)
Sodium: 142 mmol/L (ref 136–145)
Total Protein: 6.5 g/dL (ref 6.4–8.2)

## 2011-07-07 LAB — CK-MB: CK-MB: <0.5 ng/mL — ABNORMAL LOW (ref 0.5–3.6)

## 2011-07-08 LAB — BASIC METABOLIC PANEL
Calcium, Total: 8.9 mg/dL (ref 8.5–10.1)
Chloride: 109 mmol/L — ABNORMAL HIGH (ref 98–107)
Co2: 25 mmol/L (ref 21–32)
EGFR (African American): 46 — ABNORMAL LOW
EGFR (Non-African Amer.): 38 — ABNORMAL LOW
Osmolality: 294 (ref 275–301)
Potassium: 4.6 mmol/L (ref 3.5–5.1)
Sodium: 145 mmol/L (ref 136–145)

## 2011-08-11 ENCOUNTER — Ambulatory Visit: Payer: Self-pay | Admitting: Nephrology

## 2011-08-27 ENCOUNTER — Ambulatory Visit: Payer: Self-pay | Admitting: Family Medicine

## 2011-09-14 ENCOUNTER — Other Ambulatory Visit: Payer: Self-pay | Admitting: Radiology

## 2011-09-14 LAB — CREATININE, SERUM
Creatinine: 1.62 mg/dL — ABNORMAL HIGH (ref 0.60–1.30)
EGFR (African American): 40 — ABNORMAL LOW

## 2011-09-15 ENCOUNTER — Ambulatory Visit: Payer: Self-pay | Admitting: Specialist

## 2012-02-23 ENCOUNTER — Ambulatory Visit: Payer: Self-pay | Admitting: Gastroenterology

## 2013-01-08 ENCOUNTER — Ambulatory Visit: Payer: Self-pay | Admitting: Specialist

## 2013-01-25 ENCOUNTER — Ambulatory Visit: Payer: Self-pay | Admitting: Specialist

## 2013-01-25 LAB — CBC
HGB: 12.6 g/dL (ref 12.0–16.0)
MCH: 27 pg (ref 26.0–34.0)
MCHC: 32.6 g/dL (ref 32.0–36.0)
MCV: 83 fL (ref 80–100)
Platelet: 197 10*3/uL (ref 150–440)
RBC: 4.67 10*6/uL (ref 3.80–5.20)

## 2013-01-25 LAB — BASIC METABOLIC PANEL
BUN: 42 mg/dL — ABNORMAL HIGH (ref 7–18)
Calcium, Total: 9.2 mg/dL (ref 8.5–10.1)
Co2: 28 mmol/L (ref 21–32)
Creatinine: 1.48 mg/dL — ABNORMAL HIGH (ref 0.60–1.30)
EGFR (Non-African Amer.): 34 — ABNORMAL LOW
Potassium: 5 mmol/L (ref 3.5–5.1)
Sodium: 137 mmol/L (ref 136–145)

## 2013-01-25 LAB — PROTIME-INR: Prothrombin Time: 13.5 secs (ref 11.5–14.7)

## 2013-02-02 ENCOUNTER — Ambulatory Visit: Payer: Self-pay | Admitting: Specialist

## 2013-03-15 ENCOUNTER — Other Ambulatory Visit: Payer: Self-pay | Admitting: *Deleted

## 2013-03-15 NOTE — Telephone Encounter (Signed)
Refill oki

## 2013-03-15 NOTE — Telephone Encounter (Signed)
RECEIVED FAX RX  REFILL REQUEST

## 2013-03-16 MED ORDER — GABAPENTIN 400 MG PO CAPS: 400.0000 mg | ORAL_CAPSULE | Freq: Every day | ORAL | Status: AC

## 2014-03-26 ENCOUNTER — Ambulatory Visit: Payer: Self-pay | Admitting: Specialist

## 2014-03-29 ENCOUNTER — Ambulatory Visit: Payer: Self-pay | Admitting: Specialist

## 2014-04-16 ENCOUNTER — Emergency Department: Payer: Self-pay | Admitting: Emergency Medicine

## 2014-04-16 LAB — CBC WITH DIFFERENTIAL/PLATELET
BASOS PCT: 1.8 %
Basophil #: 0.2 10*3/uL — ABNORMAL HIGH (ref 0.0–0.1)
EOS PCT: 2.7 %
Eosinophil #: 0.2 10*3/uL (ref 0.0–0.7)
HCT: 43 % (ref 35.0–47.0)
HGB: 13.6 g/dL (ref 12.0–16.0)
Lymphocyte #: 1.4 10*3/uL (ref 1.0–3.6)
Lymphocyte %: 15.5 %
MCH: 27.1 pg (ref 26.0–34.0)
MCHC: 31.7 g/dL — ABNORMAL LOW (ref 32.0–36.0)
MCV: 85 fL (ref 80–100)
Monocyte #: 0.8 x10 3/mm (ref 0.2–0.9)
Monocyte %: 9.1 %
NEUTROS PCT: 70.9 %
Neutrophil #: 6.3 10*3/uL (ref 1.4–6.5)
Platelet: 187 10*3/uL (ref 150–440)
RBC: 5.04 10*6/uL (ref 3.80–5.20)
RDW: 17.1 % — ABNORMAL HIGH (ref 11.5–14.5)
WBC: 8.9 10*3/uL (ref 3.6–11.0)

## 2014-04-16 LAB — BASIC METABOLIC PANEL
ANION GAP: 8 (ref 7–16)
BUN: 20 mg/dL — AB (ref 7–18)
CALCIUM: 8.6 mg/dL (ref 8.5–10.1)
Chloride: 104 mmol/L (ref 98–107)
Co2: 31 mmol/L (ref 21–32)
Creatinine: 1.04 mg/dL (ref 0.60–1.30)
EGFR (African American): >60
EGFR (Non-African Amer.): 54 — ABNORMAL LOW
GLUCOSE: 112 mg/dL — AB (ref 65–99)
Osmolality: 288 (ref 275–301)
Potassium: 4.1 mmol/L (ref 3.5–5.1)
SODIUM: 143 mmol/L (ref 136–145)

## 2014-06-23 ENCOUNTER — Emergency Department: Payer: Self-pay | Admitting: Internal Medicine

## 2014-10-04 NOTE — Op Note (Signed)
PATIENT NAME:  [name], [name] MR#:  [id number] DATE OF BIRTH:  [birth year]  DATE OF PROCEDURE:  02/02/2013  PREOPERATIVE DIAGNOSIS: Full-thickness rotator cuff tear, right shoulder.   POSTOPERATIVE DIAGNOSIS: Full-thickness rotator cuff tear, right shoulder.   PROCEDURE: Open repair rotator cuff tear, right shoulder.   SURGEON: Christophe Louis, M.D.   ANESTHESIA: Interscalene block and general.   COMPLICATIONS: None.   ESTIMATED BLOOD LOSS: 50 mL.   DESCRIPTION OF PROCEDURE: Ancef 1 gram was given intravenously prior to the procedure. General anesthesia is induced. Prior to this, interscalene block anesthesia had been induced. The patient was secured in the supine position with a bump underneath the right shoulder. The right shoulder and arm are thoroughly prepped with alcohol and ChloraPrep and draped in standard sterile fashion. The previous anterosuperior skin incision is infiltrated with 0.5% Marcaine with epinephrine. Incision is made in the line of the previous incision and then dissection carried down to the deltoid muscle, which is split. The rotator cuff is revealed and there is seen to be a large complete tear of the cuff, but the tendon can be back brought back down into appropriate position. Anterior acromioplasty had been previously performed. The rongeur was used to remove somewhat more of the anterior acromion, and the acromion TPS rasp was used to remove more of the undersurface of the acromion. The rotator cuff is fully revealed and the excess bursa is excised. Repair is performed with a baseball type suture using #2 Tycron, which is secured back into the greater tuberosity. Range of motion demonstrates excellent fixation and complete closure of the tear. The wound is thoroughly irrigated multiple times. Muscle is closed with 0 Vicryl. Subcutaneous tissue is closed with 3-0 Vicryl and the skin is closed with the skin stapler. A soft bulky dressing is applied. The patient is  returned to the recovery room in satisfactory condition, having tolerated the procedure quite well.    ____________________________ Lucas Mallow, MD ces:jm D: 02/03/2013 13:05:25 ET T: 02/03/2013 15:40:34 ET JOB#: [id number]  cc: Lucas Mallow, MD, <Dictator> Lucas Mallow MD ELECTRONICALLY SIGNED 02/07/2013 7:42

## 2014-10-06 NOTE — H&P (Signed)
PATIENT NAME:  [name], [name] MR#:  [id number] DATE OF BIRTH:  May 04, 1933  DATE OF ADMISSION:  07/06/2011  PRIMARY CARE PHYSICIAN:  Dr. Venia Minks ER PHYSICIAN:  Dr. Mariea Clonts  CHIEF COMPLAINT:  Right-sided chest pain and right flank pain.   HISTORY OF PRESENT ILLNESS: The patient is a 81 year old female with history of hypertension, hyperlipidemia, kidney stones, history of degenerative joint disease, history of pulmonary hypertension, and gastroesophageal reflux disease who came in because of right-sided chest pain that started 2 to 3 days ago, mainly right-sided chest pain, not associated with trouble breathing. She did have some nausea. The patient had right-sided chest pain on and off.  No cough. No fever. She felt chills. It is not radiating in type. The patient also complains of flank pain on the right side, more when she takes a deep breath.  The patient mentioned that her blood pressure was high, around systolic 952W and also the daughter mentioned that it was running between 160 and 200s.  The right flank pain is associated with more pain when she takes a deep breath. Not associated with dysuria or hematuria. She has some nausea and did have vomiting two days ago.  The patient said she was feeling lightheaded.   PAST MEDICAL HISTORY:  1. Hypertension.  2. Hyperlipidemia.  3. Gastroesophageal reflux disease.  4. History of kidney stones.  5. History of joint pain status post right joint steroid injection a week ago.   ALLERGIES: Codeine.   SOCIAL HISTORY: Previous smoker, quit ten years ago. No alcohol. No drugs. Lives with her son.   PAST SURGICAL HISTORY:  1. Left hip replacement times two.  2. Gallbladder surgery.  3. Right shoulder surgery.  4. History of hysterectomy. 5. History of cardiac catheterization. 6. History of cervical cancer. 7. Arthritis.  8. History of neck fusion.   MEDICATIONS:  1. Simvastatin 20 mg p.o. daily.  2. Simethicone 1 tablet as needed. 3. Nexium 40  mg p.o. b.i.d.  4. Metoprolol ER 30 mg daily. 5. HCTZ with triamterene 50/75 mg once a day.  6. Alprazolam 0.5 mg as needed at bedtime.  7. Acetaminophen with hydrocodone 5/325 every six hours as needed.    REVIEW OF SYSTEMS: CONSTITUTIONAL: Feels some fatigue. EYES: No blurred vision. No glaucoma. ENT: No tinnitus. No hearing loss. No epistaxis. No difficulty swallowing. RESPIRATORY:  No cough. History of pulmonary hypertension. Sees Dr. Raul Del. CARDIOVASCULAR: Right-sided chest pain for two to three days. No orthopnea or PND. No pedal edema. GI: The patient has some nausea but no vomiting. No loss of appetite. No abdominal pain. History of gastroesophageal reflux disease present.  GU: No dysuria or hematuria. The patient has a history of kidney stones and right now complains of right flank pain. ENDOCRINE: No polyuria or nocturia. No thyroid problems. HEMATOLOGIC: No anemia or easy bruising. INTEGUMENT: No skin rashes. MUSCULOSKELETAL: Multiple joint pains. The patient recently had right shoulder pain and received a steroid injection a week ago and is on Percocet. NEUROLOGIC: No numbness or weakness. PSYCH: No anxiety or insomnia.   PHYSICAL EXAMINATION:  VITAL SIGNS: Pulse 68, respirations 18, blood pressure 127/59, saturation 97% on room air. The patient is alert, awake, oriented, answering questions appropriately.   HEENT: Head atraumatic, normocephalic. Pupils are equally reacting to light. Extraocular movements are intact. No scleral icterus. No conjunctivitis.  ENT: No tympanic membrane congestion. No difficulty hearing. The patient has no turbinate hypertrophy. No oropharyngeal erythema. Dentition is good.   NECK: No thyroid enlargement.  No JVD. No lymphadenopathy. No carotid bruit.   LUNGS: Bilaterally clear to auscultation. No wheeze. No rales.   CARDIOVASCULAR: S1, S2 regular. No murmurs. PMI nondisplaced. Chest nontender. Good femoral pulses. No extremity edema.   ABDOMEN: Soft, mild  right flank tenderness present. Bowel sounds are present. No organomegaly. The patient has no hernias.   MUSCULOSKELETAL: Five out of five strength upper and lower extremities. Gait is steady.   SKIN: No skin rashes.   LYMPH: No lymphadenopathy in the cervical or axillary region.   NEUROLOGIC: Cranial nerves II through XII intact. Power 5/5 in upper and lower extremities. Sensations are intact. DTRs 2+ bilaterally.   PSYCH: Oriented to time, place, and person. Cooperative.   LABORATORY DATA: EKG showed normal sinus at 73 beats per minute. No ST-T changes. CK total 33. CPK-MB less than 0.5.   Electrolytes: Sodium 139, potassium 5, chloride 104, bicarbonate 23, BUN 31, creatinine 1.71, glucose 106. Liver functions are within normal limits. INR is 1.1. Chest x-ray:  Shallow inspiration without evidence of acute cardiopulmonary abnormality. WBC is pending.   ASSESSMENT AND PLAN:  1. The patient is a 81 year old female patient with history of hypertension, hyperlipidemia, and gastroesophageal reflux disease who came in with chest pain. It looks like gastroesophageal reflux disease. Troponin is negative. EKG is normal. We will cycle cardiac enzymes and also monitor on telemetry. Continue aspirin, beta blockers, also PPIs and nitroglycerin. The patient complained of high blood pressure at home but here the blood pressure is normal so we will watch her blood pressure and adjust the medications accordingly.  2. The patient complains of right flank pain. She has a history of kidney stones, right now has some acute renal failure.  We will renal ultrasound to see if she has any stones and also if needed we will obtain CT of the abdomen.  3. Acute renal failure secondary to dehydration. Hold hydrochlorothiazide/triamterene. Renal functions tomorrow. IV fluids will be given. 4. Gastroesophageal reflux disease.  Continue PPI.  5. Hyperlipidemia. Continue simvastatin. 6. The patient has a history of pulmonary  hypertension, sees Dr. Raul Del.  She does not have any active wheezing or respiratory distress or hypoxia at this time.  Discussed the plan with the patient's daughter.  TIME SPENT ON HISTORY AND PHYSICAL: 60 minutes.    ____________________________ Epifanio Lesches, MD sk:bjt D: 07/06/2011 14:17:42 ET T: 07/06/2011 14:58:28 ET JOB#: [id number]  cc: Epifanio Lesches, MD, <Dictator> Jerrell Belfast, MD Epifanio Lesches MD ELECTRONICALLY SIGNED 08/02/2011 16:17

## 2014-10-06 NOTE — Discharge Summary (Signed)
PATIENT NAME:  [name], [name] MR#:  [id number] DATE OF BIRTH:  02-26-1933  DATE OF ADMISSION:  07/06/2011 DATE OF DISCHARGE:  07/08/1011  CHIEF COMPLAINT: Right-sided chest pain, right flank pain.   DISCHARGE DIAGNOSES:  1. Right-sided chest pain, likely GI related.  2. Acute renal failure. 3. Hypertensive urgency. 4. Gastroesophageal reflux disease.   5. Right kidney nodule.   DISCHARGE MEDICATIONS:  1. Simvastatin 20 mg daily.  2. Enablex 15 mg extended-release 1 tab daily.  3. Metoprolol XL 50 mg daily.  4. Amlodipine 5 mg daily.  5. Alprazolam 0.5 mg daily.  6. Nexium 40 mg 2 times a day. 7. Simethicone 1 tab as needed for gas.  8. Acetaminophen/hydrocodone 325/5 mg one tab every four hours as needed for pain.    DIET: Low sodium.   ACTIVITY: As tolerated.   FOLLOW-UP: Please follow-up with your PCP and check kidney function test within one week. Please check ultrasound or CAT scan of kidneys in 3 to 6 months for nodule follow-up.   HISTORY OF PRESENT ILLNESS: For full history and physical, please see the dictation on 07/06/2011 by Dr. Vianne Bulls. Briefly, this is a 81 year old Caucasian female with history of hypertension, hyperlipidemia, and kidney stones who presented with right-sided chest pain for 2 or 3 days not associated with breathing. She also had some nausea. On arrival, she did mention that her pressures have been running relatively high from 580 to 998'P systolic. The patient was admitted to the hospitalist service for further evaluation and ruling out MI.   SIGNIFICANT LABS: Initial creatinine was 1.71, on discharge 1.43. Initial BUN 31, initial sodium 139, BUN on discharge 27. LFTs on arrival within normal limits. Troponins were negative x3. Initial WBC 11.9, on discharge 9.7. Urinalysis no nitrites, 1+ leukocyte esterase, no bacteria, 14 WBCs. X-ray of the chest, portable, showing shallow inspiration but no acute cardiopulmonary disease. Ultrasound of kidneys,  bilateral, showing 0.7 x 0.6 x 0.7 cm echogenic nodule within the peripheral right kidney, otherwise unremarkable.   HOSPITAL COURSE: The patient was admitted to the hospitalist services on the telemetry unit. Cyclic cardiac enzymes were obtained which were all negative. The patient's symptoms resolved the following day. Her chest pain was atypical and likely GI related. She does have history of gastroesophageal reflux disease and her PPI was continued. The patient also did have acute renal failure on arrival and her hydrochlorothiazide/triamterene was held. Pressures remained relatively stable. Ultrasound of the kidneys did not show any hydronephrosis, however, a right-sided echogenic nodule was found and she can do an outpatient CAT scan or ultrasound in 3 to 6 months. That has not been done yet. She was started on gentle IV fluids and responded. She also had some nausea and vomiting which had resolved in the hospital and she did not require any significant intervention for them. She is to follow-up with her PCP and check a BMP within one week as well as ultrasound or CAT scan of the kidneys to evaluate and monitor the echogenic nodule found in the kidney. At this point as pressures have been relatively stable, we will go ahead and discharge. Given that she had hypertensive urgency on arrival per her, we would add amlodipine as you are holding hydrochlorothiazide/triamterene. Amlodipine will be a 5 mg dose and it can be uptitrated if needed by the PCP.   DISPOSITION: Home.   CODE STATUS: FULL CODE.   TOTAL TIME SPENT: 35 minutes.   ____________________________ Vivien Presto, MD sa:drc D: 07/08/2011 13:12:17  ET T: 07/08/2011 13:51:44 ET JOB#: [id number]  cc: Vivien Presto, MD, <Dictator> Jerrell Belfast, MD [facility] MD ELECTRONICALLY SIGNED 07/13/2011 18:41

## 2014-11-13 ENCOUNTER — Other Ambulatory Visit: Payer: Self-pay

## 2014-11-13 ENCOUNTER — Encounter: Payer: Self-pay | Admitting: Emergency Medicine

## 2014-11-13 ENCOUNTER — Emergency Department
Admission: EM | Admit: 2014-11-13 | Discharge: 2014-11-13 | Disposition: A | Discharge status: Home / Self Care | Payer: Medicare Other | Attending: Emergency Medicine | Admitting: Emergency Medicine

## 2014-11-13 DIAGNOSIS — I10 Essential (primary) hypertension: Secondary | ICD-10-CM | POA: Diagnosis not present

## 2014-11-13 DIAGNOSIS — Z79899 Other long term (current) drug therapy: Secondary | ICD-10-CM | POA: Diagnosis not present

## 2014-11-13 DIAGNOSIS — Z87891 Personal history of nicotine dependence: Secondary | ICD-10-CM | POA: Diagnosis not present

## 2014-11-13 DIAGNOSIS — Z0000 Encounter for general adult medical examination without abnormal findings: Secondary | ICD-10-CM | POA: Insufficient documentation

## 2014-11-13 DIAGNOSIS — E119 Type 2 diabetes mellitus without complications: Secondary | ICD-10-CM | POA: Diagnosis not present

## 2014-11-13 DIAGNOSIS — Z7982 Long term (current) use of aspirin: Secondary | ICD-10-CM | POA: Diagnosis not present

## 2014-11-13 DIAGNOSIS — M6281 Muscle weakness (generalized): Secondary | ICD-10-CM | POA: Diagnosis present

## 2014-11-13 HISTORY — DX: Gastro-esophageal reflux disease without esophagitis: K21.9

## 2014-11-13 HISTORY — DX: Pure hypercholesterolemia, unspecified: E78.00

## 2014-11-13 HISTORY — DX: Essential (primary) hypertension: I10

## 2014-11-13 LAB — CBC
HCT: 42 % (ref 35.0–47.0)
Hemoglobin: 13.3 g/dL (ref 12.0–16.0)
MCH: 26.5 pg (ref 26.0–34.0)
MCHC: 31.7 g/dL — ABNORMAL LOW (ref 32.0–36.0)
MCV: 83.5 fL (ref 80.0–100.0)
PLATELETS: 205 10*3/uL (ref 150–440)
RBC: 5.02 MIL/uL (ref 3.80–5.20)
RDW: 16.8 % — AB (ref 11.5–14.5)
WBC: 11.1 10*3/uL — AB (ref 3.6–11.0)

## 2014-11-13 LAB — BASIC METABOLIC PANEL
Anion gap: 7 (ref 5–15)
BUN: 21 mg/dL — ABNORMAL HIGH (ref 6–20)
CO2: 26 mmol/L (ref 22–32)
Calcium: 9.3 mg/dL (ref 8.9–10.3)
Chloride: 106 mmol/L (ref 101–111)
Creatinine, Ser: 1 mg/dL (ref 0.44–1.00)
GFR calc Af Amer: >60 mL/min (ref >60)
GFR calc non Af Amer: 52 mL/min — ABNORMAL LOW (ref >60)
Glucose, Bld: 94 mg/dL (ref 65–99)
POTASSIUM: 4.5 mmol/L (ref 3.5–5.1)
Sodium: 139 mmol/L (ref 135–145)

## 2014-11-13 NOTE — Discharge Instructions (Signed)
As we discussed, your glipizide is waiting to be picked up at [facility].  It was prescribed by Dr. Edilia Bo.  Please follow up with her at the next available opportunity.  Return to the emergency department if he develop new or worsening symptoms that concern you.   Diabetes and Standards of Medical Care Diabetes is complicated. You may find that your diabetes team includes a dietitian, nurse, diabetes educator, eye doctor, and more. To help everyone know what is going on and to help you get the care you deserve, the following schedule of care was developed to help keep you on track. Below are the tests, exams, vaccines, medicines, education, and plans you will need. HbA1c test This test shows how well you have controlled your glucose over the past 2-3 months. It is used to see if your diabetes management plan needs to be adjusted.   It is performed at least 2 times a year if you are meeting treatment goals.  It is performed 4 times a year if therapy has changed or if you are not meeting treatment goals. Blood pressure test  This test is performed at every routine medical visit. The goal is less than 140/90 mm Hg for most people, but 130/80 mm Hg in some cases. Ask your health care provider about your goal. Dental exam  Follow up with the dentist regularly. Eye exam  If you are diagnosed with type 1 diabetes as a child, get an exam upon reaching the age of 11 years or older and have had diabetes for 3-5 years. Yearly eye exams are recommended after that initial eye exam.  If you are diagnosed with type 1 diabetes as an adult, get an exam within 5 years of diagnosis and then yearly.  If you are diagnosed with type 2 diabetes, get an exam as soon as possible after the diagnosis and then yearly. Foot care exam  Visual foot exams are performed at every routine medical visit. The exams check for cuts, injuries, or other problems with the feet.  A comprehensive foot exam should be done yearly. This  includes visual inspection as well as assessing foot pulses and testing for loss of sensation.  Check your feet nightly for cuts, injuries, or other problems with your feet. Tell your health care provider if anything is not healing. Kidney function test (urine microalbumin)  This test is performed once a year.  Type 1 diabetes: The first test is performed 5 years after diagnosis.  Type 2 diabetes: The first test is performed at the time of diagnosis.  A serum creatinine and estimated glomerular filtration rate (eGFR) test is done once a year to assess the level of chronic kidney disease (CKD), if present. Lipid profile (cholesterol, HDL, LDL, triglycerides)  Performed every 5 years for most people.  The goal for LDL is less than 100 mg/dL. If you are at high risk, the goal is less than 70 mg/dL.  The goal for HDL is 40 mg/dL-50 mg/dL for men and 50 mg/dL-60 mg/dL for women. An HDL cholesterol of 60 mg/dL or higher gives some protection against heart disease.  The goal for triglycerides is less than 150 mg/dL. Influenza vaccine, pneumococcal vaccine, and hepatitis B vaccine  The influenza vaccine is recommended yearly.  It is recommended that people with diabetes who are over 42 years old get the pneumonia vaccine. In some cases, two separate shots may be given. Ask your health care provider if your pneumonia vaccination is up to date.  The  hepatitis B vaccine is also recommended for adults with diabetes. Diabetes self-management education  Education is recommended at diagnosis and ongoing as needed. Treatment plan  Your treatment plan is reviewed at every medical visit. Document Released: 03/28/2009 Document Revised: 10/15/2013 Document Reviewed: 10/31/2012 [facility] Patient Information 2015 [place], [place]. This information is not intended to replace advice given to you by your health care provider. Make sure you discuss any questions you have with your health care provider.

## 2014-11-13 NOTE — ED Provider Notes (Signed)
[facility] Emergency Department Provider Note  ____________________________________________  Time seen: Approximately 5:46 PM  I have reviewed the triage vital signs and the nursing notes.   HISTORY  Chief Complaint Weakness    HPI [name] is a 81 y.o. female with a history of diabetes and hypertension who presents with "just not feeling well "for several weeks.  She has felt a little bit of generalized weakness.  Her symptoms are mild and she has no specific symptoms or complaints.  She denies chest pain, shortness of breath, abdominal pain, nausea, vomiting, dysuria.  Upon further discussion with the patient and her daughter, it turns out that she has been out of her glipizide for several weeks, and that is the main reason that she came in today, because she will not see her new doctor for another week.   Past Medical History  Diagnosis Date  . Hypertension   . GERD (gastroesophageal reflux disease)   . High cholesterol     There are no active problems to display for this patient.   Past Surgical History  Procedure Laterality Date  . Abdominal hysterectomy    . Cholecystectomy    . Appendectomy    . Back surgery    . Joint replacement      hip  . Shoulder surgery      Current Outpatient Rx  Name  Route  Sig  Dispense  Refill  . aspirin EC 81 MG tablet   Oral   Take 81 mg by mouth daily.         Marland Kitchen esomeprazole (NEXIUM) 20 MG capsule   Oral   Take 20 mg by mouth daily at 12 noon.         . furosemide (LASIX) 20 MG tablet   Oral   Take 20 mg by mouth daily.         Marland Kitchen gabapentin (NEURONTIN) 400 MG capsule   Oral   Take 1 capsule (400 mg total) by mouth at bedtime.   30 capsule   3   . glipiZIDE-metformin (METAGLIP) 2.5-500 MG per tablet   Oral   Take 1 tablet by mouth daily.         Marland Kitchen glucose blood test strip   Other   1 each by Other route as needed for other. Use as instructed         . metoprolol  (LOPRESSOR) 50 MG tablet   Oral   Take 50 mg by mouth daily.         . simvastatin (ZOCOR) 20 MG tablet   Oral   Take 20 mg by mouth daily.           Allergies Codeine  No family history on file.  Social History History  Substance Use Topics  . Smoking status: Former Smoker -- 20 years    Types: Cigarettes  . Smokeless tobacco: Not on file  . Alcohol Use: No    Review of Systems Constitutional: No fever/chills.  Some generalized weakness. Eyes: No visual changes. ENT: No sore throat. Cardiovascular: Denies chest pain. Respiratory: Denies shortness of breath. Gastrointestinal: No abdominal pain.  No nausea, no vomiting.  No diarrhea.  No constipation. Genitourinary: Negative for dysuria. Musculoskeletal: Negative for back pain. Skin: Negative for rash. Neurological: Negative for headaches, focal weakness or numbness.  10-point ROS otherwise negative.  ____________________________________________   PHYSICAL EXAM:  VITAL SIGNS: ED Triage Vitals  Enc Vitals Group     BP 11/13/14 1330  132/77 mmHg     Pulse Rate 11/13/14 1330 81     Resp 11/13/14 1330 18     Temp 11/13/14 1330 98.3 F (36.8 C)     Temp Source 11/13/14 1330 Oral     SpO2 11/13/14 1330 96 %     Weight 11/13/14 1330 195 lb (88.451 kg)     Height 11/13/14 1330 5\' 3"  (1.6 m)     Head Cir --      Peak Flow --      Pain Score --      Pain Loc --      Pain Edu? --      Excl. in [place]? --     Constitutional: Alert and oriented. Well appearing and in no acute distress. Eyes: Conjunctivae are normal. PERRL. EOMI. Head: Atraumatic. Nose: No congestion/rhinnorhea. Mouth/Throat: Mucous membranes are moist.  Oropharynx non-erythematous. Neck: No stridor.   Cardiovascular: Normal rate, regular rhythm. Grossly normal heart sounds.  Good peripheral circulation. Respiratory: Normal respiratory effort.  No retractions. Lungs CTAB. Gastrointestinal: Soft and nontender. No distention. No abdominal bruits.  No CVA tenderness. Musculoskeletal: No lower extremity tenderness nor edema.  No joint effusions. Neurologic:  Normal speech and language. No gross focal neurologic deficits are appreciated. Speech is normal. No gait instability. Skin:  Skin is warm, dry and intact. No rash noted. Psychiatric: Mood and affect are normal. Speech and behavior are normal.  ____________________________________________   LABS (all labs ordered are listed, but only abnormal results are displayed)  Labs Reviewed  CBC - Abnormal; Notable for the following:    WBC 11.1 (*)    MCHC 31.7 (*)    RDW 16.8 (*)    All other components within normal limits  BASIC METABOLIC PANEL - Abnormal; Notable for the following:    BUN 21 (*)    GFR calc non Af Amer 52 (*)    All other components within normal limits  URINALYSIS COMPLETEWITH MICROSCOPIC ([facility] ONLY)   ____________________________________________  EKG  ED ECG REPORT I, Tiari Andringa, the attending physician, personally viewed and interpreted this ECG.  Date: 11/13/2014 EKG Time: 13:46 Rate: 80 Rhythm: normal sinus rhythm QRS Axis: normal Intervals: normal ST/T Wave abnormalities: normal Conduction Disutrbances: none Narrative Interpretation: unremarkable  ____________________________________________  RADIOLOGY  Not indicated  ____________________________________________   INITIAL IMPRESSION / ASSESSMENT AND PLAN / ED COURSE  Pertinent labs & imaging results that were available during my care of the patient were reviewed by me and considered in my medical decision making (see chart for details).  The patient is well-appearing, hemodynamically stable, afebrile, and has no acute complaints.  She is most interested in getting her prescription refilled.  I called her pharmacy, and it turns out that the prescription is awaiting her arrival.  I discussed this with the patient and her daughter who said that is fine and they are going to pick up the  prescription now.  They will follow up with her regular doctor at the next available appointment.  I have no concern for an acute/emergent medical condition at this time.  ____________________________________________  FINAL CLINICAL IMPRESSION(S) / ED DIAGNOSES  Final diagnoses:  Medication refill  Normal physical exam      NEW MEDICATIONS STARTED DURING THIS VISIT:  New Prescriptions   No medications on file     Hinda Kehr, MD 11/13/14 2013

## 2014-11-13 NOTE — ED Notes (Signed)
Pt states she has been weak since she woke up this am, denies any pain, is unable to see her new Dr until the 16th and has been off her diabetes medication for a few days.

## 2015-02-03 ENCOUNTER — Emergency Department: Payer: Medicare Other

## 2015-02-03 ENCOUNTER — Other Ambulatory Visit: Payer: Self-pay

## 2015-02-03 ENCOUNTER — Emergency Department
Admission: EM | Admit: 2015-02-03 | Discharge: 2015-02-03 | Disposition: A | Discharge status: Home / Self Care | Payer: Medicare Other | Attending: Emergency Medicine | Admitting: Emergency Medicine

## 2015-02-03 ENCOUNTER — Encounter: Payer: Self-pay | Admitting: Emergency Medicine

## 2015-02-03 DIAGNOSIS — R1010 Upper abdominal pain, unspecified: Secondary | ICD-10-CM

## 2015-02-03 DIAGNOSIS — R1011 Right upper quadrant pain: Secondary | ICD-10-CM | POA: Insufficient documentation

## 2015-02-03 DIAGNOSIS — E119 Type 2 diabetes mellitus without complications: Secondary | ICD-10-CM | POA: Diagnosis not present

## 2015-02-03 DIAGNOSIS — Z79899 Other long term (current) drug therapy: Secondary | ICD-10-CM | POA: Insufficient documentation

## 2015-02-03 DIAGNOSIS — Z7982 Long term (current) use of aspirin: Secondary | ICD-10-CM | POA: Diagnosis not present

## 2015-02-03 DIAGNOSIS — I10 Essential (primary) hypertension: Secondary | ICD-10-CM | POA: Insufficient documentation

## 2015-02-03 DIAGNOSIS — R079 Chest pain, unspecified: Secondary | ICD-10-CM | POA: Insufficient documentation

## 2015-02-03 DIAGNOSIS — Z87891 Personal history of nicotine dependence: Secondary | ICD-10-CM | POA: Diagnosis not present

## 2015-02-03 DIAGNOSIS — R0602 Shortness of breath: Secondary | ICD-10-CM | POA: Diagnosis present

## 2015-02-03 HISTORY — DX: Type 2 diabetes mellitus without complications: E11.9

## 2015-02-03 LAB — BASIC METABOLIC PANEL
Anion gap: 10 (ref 5–15)
BUN: 21 mg/dL — AB (ref 6–20)
CO2: 25 mmol/L (ref 22–32)
Calcium: 9.3 mg/dL (ref 8.9–10.3)
Chloride: 104 mmol/L (ref 101–111)
Creatinine, Ser: 1.36 mg/dL — ABNORMAL HIGH (ref 0.44–1.00)
GFR calc non Af Amer: 36 mL/min — ABNORMAL LOW (ref >60)
GFR, EST AFRICAN AMERICAN: 41 mL/min — AB (ref >60)
Glucose, Bld: 171 mg/dL — ABNORMAL HIGH (ref 65–99)
POTASSIUM: 4.2 mmol/L (ref 3.5–5.1)
SODIUM: 139 mmol/L (ref 135–145)

## 2015-02-03 LAB — LIPASE, BLOOD: Lipase: 18 U/L — ABNORMAL LOW (ref 22–51)

## 2015-02-03 LAB — CBC
HCT: 46.1 % (ref 35.0–47.0)
Hemoglobin: 14.6 g/dL (ref 12.0–16.0)
MCH: 26.3 pg (ref 26.0–34.0)
MCHC: 31.7 g/dL — ABNORMAL LOW (ref 32.0–36.0)
MCV: 82.9 fL (ref 80.0–100.0)
Platelets: 222 10*3/uL (ref 150–440)
RBC: 5.56 MIL/uL — ABNORMAL HIGH (ref 3.80–5.20)
RDW: 16.2 % — ABNORMAL HIGH (ref 11.5–14.5)
WBC: 13.9 10*3/uL — ABNORMAL HIGH (ref 3.6–11.0)

## 2015-02-03 LAB — HEPATIC FUNCTION PANEL
ALT: 18 U/L (ref 14–54)
AST: 31 U/L (ref 15–41)
Albumin: 3.8 g/dL (ref 3.5–5.0)
Alkaline Phosphatase: 101 U/L (ref 38–126)
Bilirubin, Direct: <0.1 mg/dL — ABNORMAL LOW (ref 0.1–0.5)
Total Bilirubin: 0.1 mg/dL — ABNORMAL LOW (ref 0.3–1.2)
Total Protein: 7 g/dL (ref 6.5–8.1)

## 2015-02-03 LAB — TROPONIN I: Troponin I: <0.03 ng/mL (ref <0.031)

## 2015-02-03 MED ORDER — TRAMADOL HCL 50 MG PO TABS
50.0000 mg | ORAL_TABLET | Freq: Once | ORAL | Status: AC
  Administered 2015-02-03: 50 mg via ORAL
  Filled 2015-02-03: qty 1

## 2015-02-03 MED ORDER — IOHEXOL 240 MG/ML SOLN
25.0000 mL | Freq: Once | INTRAMUSCULAR | Status: DC | PRN
  Administered 2015-02-03: 25 mL via INTRAVENOUS
  Filled 2015-02-03: qty 50

## 2015-02-03 MED ORDER — TRAMADOL HCL 50 MG PO TABS: 50.0000 mg | ORAL_TABLET | Freq: Four times a day (QID) | ORAL | Status: DC | PRN

## 2015-02-03 MED ORDER — IOHEXOL 300 MG/ML  SOLN
75.0000 mL | Freq: Once | INTRAMUSCULAR | Status: AC | PRN
  Administered 2015-02-03: 75 mL via INTRAVENOUS

## 2015-02-03 NOTE — Discharge Instructions (Signed)
Please seek medical attention for any high fevers, chest pain, shortness of breath, change in behavior, persistent vomiting, bloody stool or any other new or concerning symptoms.  Pleurisy Pleurisy is an inflammation and swelling of the lining of the lungs (pleura). Because of this inflammation, it hurts to breathe. It can be aggravated by coughing, laughing, or deep breathing. Pleurisy is often caused by an underlying infection or disease.  HOME CARE INSTRUCTIONS  Monitor your pleurisy for any changes. The following actions may help to alleviate any discomfort you are experiencing:  Medicine may help with pain. Only take over-the-counter or prescription medicines for pain, discomfort, or fever as directed by your health care provider.  Only take antibiotic medicine as directed. Make sure to finish it even if you start to feel better. SEEK MEDICAL CARE IF:   Your pain is not controlled with medicine or is increasing.  You have an increase in pus-like (purulent) secretions brought up with coughing. SEEK IMMEDIATE MEDICAL CARE IF:   You have blue or dark lips, fingernails, or toenails.  You are coughing up blood.  You have increased difficulty breathing.  You have continuing pain unrelieved by medicine or pain lasting more than 1 week.  You have pain that radiates into your neck, arms, or jaw.  You develop increased shortness of breath or wheezing.  You develop a fever, rash, vomiting, fainting, or other serious symptoms. MAKE SURE YOU:  Understand these instructions.   Will watch your condition.   Will get help right away if you are not doing well or get worse.  Document Released: 05/31/2005 Document Revised: 01/31/2013 Document Reviewed: 11/12/2012 [facility] Patient Information 2015 [place], [place]. This information is not intended to replace advice given to you by your health care provider. Make sure you discuss any questions you have with your health care provider.

## 2015-02-03 NOTE — ED Notes (Signed)
Pt states she woke up with sob, right shoulder and rib pain, states it hurts worse when she takes a deep breath in.

## 2015-02-03 NOTE — ED Provider Notes (Signed)
[facility] Emergency Department Provider Note  ____________________________________________  Time seen: 1630  I have reviewed the triage vital signs and the nursing notes.   HISTORY  Chief Complaint Shortness of Breath   History limited by: Not Limited   HPI [name] is a 81 y.o. female who presents to the emergency department today with right lower chest/right upper abdominal pain. She states that this pain startedthis morning. She states that she first noticed it upon awakening. She states the pain is worse when she tries taking a deep breath. She denies any shortness of breath. She denies any cough. She states she has had similar pain in the past and has been diagnosed with pleurisy. She denies any recent fevers.     Past Medical History  Diagnosis Date  . Hypertension   . GERD (gastroesophageal reflux disease)   . High cholesterol   . Diabetes mellitus without complication     There are no active problems to display for this patient.   Past Surgical History  Procedure Laterality Date  . Abdominal hysterectomy    . Cholecystectomy    . Appendectomy    . Back surgery    . Joint replacement      hip  . Shoulder surgery      Current Outpatient Rx  Name  Route  Sig  Dispense  Refill  . aspirin EC 81 MG tablet   Oral   Take 81 mg by mouth daily.         Marland Kitchen esomeprazole (NEXIUM) 20 MG capsule   Oral   Take 20 mg by mouth daily at 12 noon.         . furosemide (LASIX) 20 MG tablet   Oral   Take 20 mg by mouth daily.         Marland Kitchen gabapentin (NEURONTIN) 400 MG capsule   Oral   Take 1 capsule (400 mg total) by mouth at bedtime.   30 capsule   3   . glipiZIDE (GLUCOTROL XL) 2.5 MG 24 hr tablet   Oral   Take 2.5 mg by mouth daily with breakfast.         . glucose blood test strip   Other   1 each by Other route as needed for other. Use as instructed         . metoprolol (LOPRESSOR) 50 MG tablet   Oral   Take 50 mg  by mouth daily.         . simvastatin (ZOCOR) 20 MG tablet   Oral   Take 20 mg by mouth daily.           Allergies Codeine  No family history on file.  Social History Social History  Substance Use Topics  . Smoking status: Former Smoker -- 20 years    Types: Cigarettes  . Smokeless tobacco: None  . Alcohol Use: No    Review of Systems  Constitutional: Negative for fever. Cardiovascular: Right lower chest pain Respiratory: Negative for shortness of breath. Gastrointestinal: Negative for abdominal pain, vomiting and diarrhea. Musculoskeletal: Negative for back pain. Skin: Negative for rash. Neurological: Negative for headaches, focal weakness or numbness.  10-point ROS otherwise negative.  ____________________________________________   PHYSICAL EXAM:  VITAL SIGNS: ED Triage Vitals  Enc Vitals Group     BP --      Pulse Rate 02/03/15 1528 98     Resp 02/03/15 1528 20     Temp 02/03/15 1528 98.2 F (  36.8 C)     Temp Source 02/03/15 1528 Oral     SpO2 02/03/15 1528 94 %     Weight 02/03/15 1520 195 lb (88.451 kg)     Height 02/03/15 1520 5\' 3"  (1.6 m)     Head Cir --      Peak Flow --      Pain Score 02/03/15 1521 8   Constitutional: Alert and oriented. Well appearing and in no distress. Eyes: Conjunctivae are normal. PERRL. Normal extraocular movements. ENT   Head: Normocephalic and atraumatic.   Nose: No congestion/rhinnorhea.   Mouth/Throat: Mucous membranes are moist.   Neck: No stridor. Hematological/Lymphatic/Immunilogical: No cervical lymphadenopathy. Cardiovascular: Normal rate, regular rhythm.  No murmurs, rubs, or gallops. Respiratory: Normal respiratory effort without tachypnea nor retractions. Breath sounds are clear and equal bilaterally. No wheezes/rales/rhonchi. Gastrointestinal: Soft. Tender to palpation in the right upper abdomen.  Genitourinary: Deferred Musculoskeletal: Normal range of motion in all extremities. No joint  effusions.  No lower extremity tenderness nor edema. Neurologic:  Normal speech and language. No gross focal neurologic deficits are appreciated. Speech is normal.  Skin:  Skin is warm, dry and intact. No rash noted. Psychiatric: Mood and affect are normal. Speech and behavior are normal. Patient exhibits appropriate insight and judgment.  ____________________________________________    LABS (pertinent positives/negatives)  Labs Reviewed  BASIC METABOLIC PANEL - Abnormal; Notable for the following:    Glucose, Bld 171 (*)    BUN 21 (*)    Creatinine, Ser 1.36 (*)    GFR calc non Af Amer 36 (*)    GFR calc Af Amer 41 (*)    All other components within normal limits  CBC - Abnormal; Notable for the following:    WBC 13.9 (*)    RBC 5.56 (*)    MCHC 31.7 (*)    RDW 16.2 (*)    All other components within normal limits  HEPATIC FUNCTION PANEL - Abnormal; Notable for the following:    Total Bilirubin 0.1 (*)    Bilirubin, Direct <0.1 (*)    All other components within normal limits  LIPASE, BLOOD - Abnormal; Notable for the following:    Lipase 18 (*)    All other components within normal limits  TROPONIN I     ____________________________________________   EKG  I, Nance Pear, attending physician, personally viewed and interpreted this EKG  EKG Time: 1534 Rate: 94 Rhythm: NSR Axis: normal Intervals: qtc 452 QRS: narrow ST changes: No st elevation   ____________________________________________    RADIOLOGY  CXR  IMPRESSION: Worsening chronic interstitial lung disease.  ____________________________________________   PROCEDURES  Procedure(s) performed: None  Critical Care performed: No  ____________________________________________   INITIAL IMPRESSION / ASSESSMENT AND PLAN / ED COURSE  Pertinent labs & imaging results that were available during my care of the patient were reviewed by me and considered in my medical decision making (see chart for  details).  Patient presented to the emergency department today because of concerns for right lower chest/right upper abdominal pain. On exam patient was tender in the right upper abdomen. A CT scan was performed which did not show any acute findings. The patient did not complain of any shortness of breath to me nor were her vital signs concerning for any tachycardia or hypoxia. This point have very low suspicion for pulmonary embolism. I did however discuss return precautions with patient and family. She does have a history of pleurisy and I will give tramadol for that.  ____________________________________________   FINAL CLINICAL IMPRESSION(S) / ED DIAGNOSES  Final diagnoses:  Upper abdominal pain     Nance Pear, MD 02/03/15 2000

## 2015-02-03 NOTE — ED Notes (Signed)
Pt reports right chest pain under breast starting this morning, pt reports a prior hx of pleursy and states" it feels similar"

## 2015-07-07 ENCOUNTER — Emergency Department
Admission: EM | Admit: 2015-07-07 | Discharge: 2015-07-07 | Disposition: A | Discharge status: Home / Self Care | Payer: Medicare Other | Attending: Emergency Medicine | Admitting: Emergency Medicine

## 2015-07-07 ENCOUNTER — Encounter: Payer: Self-pay | Admitting: Emergency Medicine

## 2015-07-07 ENCOUNTER — Emergency Department: Payer: Medicare Other

## 2015-07-07 DIAGNOSIS — R1013 Epigastric pain: Secondary | ICD-10-CM | POA: Diagnosis not present

## 2015-07-07 DIAGNOSIS — Z7982 Long term (current) use of aspirin: Secondary | ICD-10-CM | POA: Insufficient documentation

## 2015-07-07 DIAGNOSIS — Z87891 Personal history of nicotine dependence: Secondary | ICD-10-CM | POA: Diagnosis not present

## 2015-07-07 DIAGNOSIS — R112 Nausea with vomiting, unspecified: Secondary | ICD-10-CM | POA: Diagnosis present

## 2015-07-07 DIAGNOSIS — I10 Essential (primary) hypertension: Secondary | ICD-10-CM | POA: Diagnosis not present

## 2015-07-07 DIAGNOSIS — Z79899 Other long term (current) drug therapy: Secondary | ICD-10-CM | POA: Insufficient documentation

## 2015-07-07 DIAGNOSIS — E860 Dehydration: Secondary | ICD-10-CM | POA: Insufficient documentation

## 2015-07-07 DIAGNOSIS — E119 Type 2 diabetes mellitus without complications: Secondary | ICD-10-CM | POA: Diagnosis not present

## 2015-07-07 DIAGNOSIS — Z8719 Personal history of other diseases of the digestive system: Secondary | ICD-10-CM | POA: Diagnosis not present

## 2015-07-07 LAB — COMPREHENSIVE METABOLIC PANEL
ALT: 26 U/L (ref 14–54)
AST: 36 U/L (ref 15–41)
Albumin: 3.9 g/dL (ref 3.5–5.0)
Alkaline Phosphatase: 87 U/L (ref 38–126)
Anion gap: 11 (ref 5–15)
BILIRUBIN TOTAL: 0.8 mg/dL (ref 0.3–1.2)
BUN: 20 mg/dL (ref 6–20)
CALCIUM: 9.4 mg/dL (ref 8.9–10.3)
CO2: 25 mmol/L (ref 22–32)
CREATININE: 1.3 mg/dL — AB (ref 0.44–1.00)
Chloride: 105 mmol/L (ref 101–111)
GFR calc Af Amer: 44 mL/min — ABNORMAL LOW (ref >60)
GFR, EST NON AFRICAN AMERICAN: 38 mL/min — AB (ref >60)
Glucose, Bld: 165 mg/dL — ABNORMAL HIGH (ref 65–99)
Potassium: 4.4 mmol/L (ref 3.5–5.1)
Sodium: 141 mmol/L (ref 135–145)
TOTAL PROTEIN: 6.8 g/dL (ref 6.5–8.1)

## 2015-07-07 LAB — CBC WITH DIFFERENTIAL/PLATELET
BASOS PCT: 1 %
Basophils Absolute: 0.1 10*3/uL (ref 0–0.1)
EOS ABS: 0.3 10*3/uL (ref 0–0.7)
EOS PCT: 3 %
HCT: 45.2 % (ref 35.0–47.0)
HEMOGLOBIN: 14.5 g/dL (ref 12.0–16.0)
LYMPHS ABS: 1.5 10*3/uL (ref 1.0–3.6)
Lymphocytes Relative: 15 %
MCH: 26.4 pg (ref 26.0–34.0)
MCHC: 32 g/dL (ref 32.0–36.0)
MCV: 82.5 fL (ref 80.0–100.0)
MONOS PCT: 5 %
Monocytes Absolute: 0.5 10*3/uL (ref 0.2–0.9)
NEUTROS PCT: 76 %
Neutro Abs: 7.5 10*3/uL — ABNORMAL HIGH (ref 1.4–6.5)
PLATELETS: 198 10*3/uL (ref 150–440)
RBC: 5.48 MIL/uL — AB (ref 3.80–5.20)
RDW: 16.2 % — ABNORMAL HIGH (ref 11.5–14.5)
WBC: 10 10*3/uL (ref 3.6–11.0)

## 2015-07-07 LAB — TROPONIN I: TROPONIN I: 0.03 ng/mL (ref <0.031)

## 2015-07-07 LAB — LIPASE, BLOOD: Lipase: 21 U/L (ref 11–51)

## 2015-07-07 MED ORDER — IOHEXOL 240 MG/ML SOLN
25.0000 mL | INTRAMUSCULAR | Status: AC
  Administered 2015-07-07: 50 mL via ORAL

## 2015-07-07 MED ORDER — PANTOPRAZOLE SODIUM 40 MG IV SOLR
40.0000 mg | Freq: Once | INTRAVENOUS | Status: AC
  Administered 2015-07-07: 40 mg via INTRAVENOUS
  Filled 2015-07-07: qty 40

## 2015-07-07 MED ORDER — METOCLOPRAMIDE HCL 5 MG/ML IJ SOLN
10.0000 mg | Freq: Once | INTRAMUSCULAR | Status: AC
  Administered 2015-07-07: 10 mg via INTRAVENOUS
  Filled 2015-07-07: qty 2

## 2015-07-07 MED ORDER — SODIUM CHLORIDE 0.9 % IV BOLUS (SEPSIS)
1000.0000 mL | Freq: Once | INTRAVENOUS | Status: AC
  Administered 2015-07-07: 1000 mL via INTRAVENOUS

## 2015-07-07 MED ORDER — ONDANSETRON 8 MG PO TBDP
8.0000 mg | ORAL_TABLET | Freq: Three times a day (TID) | ORAL | Status: DC | PRN
Start: 2015-07-07 — End: 2017-11-30

## 2015-07-07 NOTE — ED Notes (Signed)
Brought in via ems with nausea/vomiting this am   Recently dx'd with bladder infection

## 2015-07-07 NOTE — ED Provider Notes (Signed)
[facility] Emergency Department Provider Note  ____________________________________________  Time seen: 9:50 AM  I have reviewed the triage vital signs and the nursing notes.   HISTORY  Chief Complaint Nausea    HPI [name] is a 82 y.o. female is brought to the ED for nausea and vomiting today. She recently had a bladder infection. She also has an history of esophageal stricture and is planned follow-up with GI about 2 weeks for repeat dilation. Her family members at the bedside also indicate that the patient does not eat or drink regularly and frequently gets dehydrated and also is prone to not chewing her food adequately and therefore choking on it. She's been prescribed PPIs in the past but does not take it regularly.  No abdominal pain. Denies chest pain or shortness of breath. No back pain   Past Medical History  Diagnosis Date  . Hypertension   . GERD (gastroesophageal reflux disease)   . High cholesterol   . Diabetes mellitus without complication ([place])      There are no active problems to display for this patient.    Past Surgical History  Procedure Laterality Date  . Abdominal hysterectomy    . Cholecystectomy    . Appendectomy    . Back surgery    . Joint replacement      hip  . Shoulder surgery       Current Outpatient Rx  Name  Route  Sig  Dispense  Refill  . aspirin EC 81 MG tablet   Oral   Take 81 mg by mouth daily.         Marland Kitchen esomeprazole (NEXIUM) 20 MG capsule   Oral   Take 20 mg by mouth daily at 12 noon.         . furosemide (LASIX) 20 MG tablet   Oral   Take 20 mg by mouth daily.         Marland Kitchen gabapentin (NEURONTIN) 400 MG capsule   Oral   Take 1 capsule (400 mg total) by mouth at bedtime.   30 capsule   3   . glipiZIDE (GLUCOTROL XL) 2.5 MG 24 hr tablet   Oral   Take 2.5 mg by mouth daily with breakfast.         . glucose blood test strip   Other   1 each by Other route as needed for other.  Use as instructed         . metoprolol (LOPRESSOR) 50 MG tablet   Oral   Take 50 mg by mouth daily.         . ondansetron (ZOFRAN ODT) 8 MG disintegrating tablet   Oral   Take 1 tablet (8 mg total) by mouth every 8 (eight) hours as needed for nausea or vomiting.   20 tablet   0   . simvastatin (ZOCOR) 20 MG tablet   Oral   Take 20 mg by mouth daily.         . traMADol (ULTRAM) 50 MG tablet   Oral   Take 1 tablet (50 mg total) by mouth every 6 (six) hours as needed.   20 tablet   0      Allergies Codeine   No family history on file.  Social History Social History  Substance Use Topics  . Smoking status: Former Smoker -- 20 years    Types: Cigarettes  . Smokeless tobacco: None  . Alcohol Use: No    Review of  Systems  Constitutional:   No fever or chills. No weight changes Eyes:   No blurry vision or double vision.  ENT:   No sore throat. Cardiovascular:   No chest pain. Respiratory:   No dyspnea or cough. Gastrointestinal:   Negative for abdominal pain, positive vomiting.  No BRBPR or melena. Genitourinary:   Negative for dysuria, urinary retention, bloody urine, or difficulty urinating. Musculoskeletal:   Negative for back pain. No joint swelling or pain. Skin:   Negative for rash. Neurological:   Negative for headaches, focal weakness or numbness. Psychiatric:  No anxiety or depression.   Endocrine:  No hot/cold intolerance, changes in energy, or sleep difficulty.  10-point ROS otherwise negative.  ____________________________________________   PHYSICAL EXAM:  VITAL SIGNS: ED Triage Vitals  Enc Vitals Group     BP 07/07/15 1007 89/72 mmHg     Pulse Rate 07/07/15 1007 69     Resp 07/07/15 1007 22     Temp 07/07/15 1007 96 F (35.6 C)     Temp Source 07/07/15 1007 Oral     SpO2 07/07/15 1007 94 %     Weight 07/07/15 1007 187 lb (84.823 kg)     Height 07/07/15 1007 5\' 3"  (1.6 m)     Head Cir --      Peak Flow --      Pain Score --       Pain Loc --      Pain Edu? --      Excl. in [place]? --     Vital signs reviewed, nursing assessments reviewed.   Constitutional:   Alert and oriented. Well appearing and in no distress. Eyes:   No scleral icterus. No conjunctival pallor. PERRL. EOMI ENT   Head:   Normocephalic and atraumatic.   Nose:   No congestion/rhinnorhea. No septal hematoma   Mouth/Throat:   Dry mucous membranes, no pharyngeal erythema. No peritonsillar mass. No uvula shift.   Neck:   No stridor. No SubQ emphysema. No meningismus. Hematological/Lymphatic/Immunilogical:   No cervical lymphadenopathy. Cardiovascular:   RRR. Normal and symmetric distal pulses are present in all extremities. No murmurs, rubs, or gallops. Respiratory:   Normal respiratory effort without tachypnea nor retractions. Breath sounds are clear and equal bilaterally. No wheezes/rales/rhonchi. Gastrointestinal:   Soft with mild epigastric tenderness. No distention. There is no CVA tenderness.  No rebound, rigidity, or guarding. Genitourinary:   deferred Musculoskeletal:   Nontender with normal range of motion in all extremities. No joint effusions.  No lower extremity tenderness.  No edema. Neurologic:   Normal speech and language.  CN 2-10 normal. Motor grossly intact. No pronator drift.  Normal gait. No gross focal neurologic deficits are appreciated.  Skin:    Skin is warm, dry and intact. No rash noted.  No petechiae, purpura, or bullae. Psychiatric:   Mood and affect are normal. Speech and behavior are normal. Patient exhibits appropriate insight and judgment.  ____________________________________________    LABS (pertinent positives/negatives) (all labs ordered are listed, but only abnormal results are displayed) Labs Reviewed  COMPREHENSIVE METABOLIC PANEL - Abnormal; Notable for the following:    Glucose, Bld 165 (*)    Creatinine, Ser 1.30 (*)    GFR calc non Af Amer 38 (*)    GFR calc Af Amer 44 (*)    All other  components within normal limits  CBC WITH DIFFERENTIAL/PLATELET - Abnormal; Notable for the following:    RBC 5.48 (*)    RDW 16.2 (*)  Neutro Abs 7.5 (*)    All other components within normal limits  CULTURE, BLOOD (ROUTINE X 2)  CULTURE, BLOOD (ROUTINE X 2)  LIPASE, BLOOD  TROPONIN I  URINALYSIS COMPLETEWITH MICROSCOPIC ([facility] ONLY)   ____________________________________________   EKG  Interpreted by me  Date: 07/07/2015  Rate: 71  Rhythm: normal sinus rhythm  QRS Axis: normal  Intervals: normal  ST/T Wave abnormalities: normal  Conduction Disutrbances: none  Narrative Interpretation: unremarkable      ____________________________________________    RADIOLOGY  Chest x-ray unremarkable CT abdomen and pelvis unremarkable except for some evidence of impaired esophageal motility  ____________________________________________   PROCEDURES   ____________________________________________   INITIAL IMPRESSION / ASSESSMENT AND PLAN / ED COURSE  Pertinent labs & imaging results that were available during my care of the patient were reviewed by me and considered in my medical decision making (see chart for details).  Patient resents with nausea and vomiting and malaise. Overall not in distress and fairly well-appearing. We'll check labs including troponin. Given IV fluids and antiemetics for symptom relief. If there is no clear diagnosis by lab workup up her seat with CT scan of the abdomen as well due to the location of pain and age.  ----------------------------------------- 2:18 PM on 07/07/2015 -----------------------------------------  Complete workup including chest x-ray and CT abdomen unremarkable. It is consistent with likely esophageal stricture worsening and her history of not eating or drinking very regularly and not taking her PPI. After IV fluids she is feeling much better resting comfortably. Abdomen is nontender. We'll discharge home with nausea  medicine. Continue follow-up with primary care as well as GI appointment in 2 weeks.     ____________________________________________   FINAL CLINICAL IMPRESSION(S) / ED DIAGNOSES  Final diagnoses:  Mild dehydration  Non-intractable vomiting with nausea, vomiting of unspecified type  History of esophageal stricture      Carrie Mew, MD 07/07/15 1419

## 2015-07-07 NOTE — Discharge Instructions (Signed)
Dehydration, Adult Dehydration is a condition in which you do not have enough fluid or water in your body. It happens when you take in less fluid than you lose. Vital organs such as the kidneys, brain, and heart cannot function without a proper amount of fluids. Any loss of fluids from the body can cause dehydration.  Dehydration can range from mild to severe. This condition should be treated right away to help prevent it from becoming severe. CAUSES  This condition may be caused by:  Vomiting.  Diarrhea.  Excessive sweating, such as when exercising in hot or humid weather.  Not drinking enough fluid during strenuous exercise or during an illness.  Excessive urine output.  Fever.  Certain medicines. RISK FACTORS This condition is more likely to develop in:  People who are taking certain medicines that cause the body to lose excess fluid (diuretics).   People who have a chronic illness, such as diabetes, that may increase urination.  Older adults.   People who live at high altitudes.   People who participate in endurance sports.  SYMPTOMS  Mild Dehydration  Thirst.  Dry lips.  Slightly dry mouth.  Dry, warm skin. Moderate Dehydration  Very dry mouth.   Muscle cramps.   Dark urine and decreased urine production.   Decreased tear production.   Headache.   Light-headedness, especially when you stand up from a sitting position.  Severe Dehydration  Changes in skin.   Cold and clammy skin.   Skin does not spring back quickly when lightly pinched and released.   Changes in body fluids.   Extreme thirst.   No tears.   Not able to sweat when body temperature is high, such as in hot weather.   Minimal urine production.   Changes in vital signs.   Rapid, weak pulse (more than 100 beats per minute when you are sitting still).   Rapid breathing.   Low blood pressure.   Other changes.   Sunken eyes.   Cold hands and feet.    Confusion.  Lethargy and difficulty being awakened.  Fainting (syncope).   Short-term weight loss.   Unconsciousness. DIAGNOSIS  This condition may be diagnosed based on your symptoms. You may also have tests to determine how severe your dehydration is. These tests may include:   Urine tests.   Blood tests.  TREATMENT  Treatment for this condition depends on the severity. Mild or moderate dehydration can often be treated at home. Treatment should be started right away. Do not wait until dehydration becomes severe. Severe dehydration needs to be treated at the hospital. Treatment for Mild Dehydration  Drinking plenty of water to replace the fluid you have lost.   Replacing minerals in your blood (electrolytes) that you may have lost.  Treatment for Moderate Dehydration  Consuming oral rehydration solution (ORS). Treatment for Severe Dehydration  Receiving fluid through an IV tube.   Receiving electrolyte solution through a feeding tube that is passed through your nose and into your stomach (nasogastric tube or NG tube).  Correcting any abnormalities in electrolytes. HOME CARE INSTRUCTIONS   Drink enough fluid to keep your urine clear or pale yellow.   Drink water or fluid slowly by taking small sips. You can also try sucking on ice cubes.  Have food or beverages that contain electrolytes. Examples include bananas and sports drinks.  Take over-the-counter and prescription medicines only as told by your health care provider.   Prepare ORS according to the manufacturer's instructions. Take sips   of ORS every 5 minutes until your urine returns to normal.  If you have vomiting or diarrhea, continue to try to drink water, ORS, or both.   If you have diarrhea, avoid:   Beverages that contain caffeine.   Fruit juice.   Milk.   Carbonated soft drinks.  Do not take salt tablets. This can lead to the condition of having too much sodium in your body  (hypernatremia).  SEEK MEDICAL CARE IF:  You cannot eat or drink without vomiting.  You have had moderate diarrhea during a period of more than 24 hours.  You have a fever. SEEK IMMEDIATE MEDICAL CARE IF:   You have extreme thirst.  You have severe diarrhea.  You have not urinated in 6-8 hours, or you have urinated only a small amount of very dark urine.  You have shriveled skin.  You are dizzy, confused, or both.   This information is not intended to replace advice given to you by your health care provider. Make sure you discuss any questions you have with your health care provider.   Document Released: 05/31/2005 Document Revised: 02/19/2015 Document Reviewed: 10/16/2014 Elsevier Interactive Patient Education 2016 Elsevier Inc.    Nausea and Vomiting Nausea is a sick feeling that often comes before throwing up (vomiting). Vomiting is a reflex where stomach contents come out of your mouth. Vomiting can cause severe loss of body fluids (dehydration). Children and elderly adults can become dehydrated quickly, especially if they also have diarrhea. Nausea and vomiting are symptoms of a condition or disease. It is important to find the cause of your symptoms. CAUSES   Direct irritation of the stomach lining. This irritation can result from increased acid production (gastroesophageal reflux disease), infection, food poisoning, taking certain medicines (such as nonsteroidal anti-inflammatory drugs), alcohol use, or tobacco use.  Signals from the brain.These signals could be caused by a headache, heat exposure, an inner ear disturbance, increased pressure in the brain from injury, infection, a tumor, or a concussion, pain, emotional stimulus, or metabolic problems.  An obstruction in the gastrointestinal tract (bowel obstruction).  Illnesses such as diabetes, hepatitis, gallbladder problems, appendicitis, kidney problems, cancer, sepsis, atypical symptoms of a heart attack, or  eating disorders.  Medical treatments such as chemotherapy and radiation.  Receiving medicine that makes you sleep (general anesthetic) during surgery. DIAGNOSIS Your caregiver may ask for tests to be done if the problems do not improve after a few days. Tests may also be done if symptoms are severe or if the reason for the nausea and vomiting is not clear. Tests may include:  Urine tests.  Blood tests.  Stool tests.  Cultures (to look for evidence of infection).  X-rays or other imaging studies. Test results can help your caregiver make decisions about treatment or the need for additional tests. TREATMENT You need to stay well hydrated. Drink frequently but in small amounts.You may wish to drink water, sports drinks, clear broth, or eat frozen ice pops or gelatin dessert to help stay hydrated.When you eat, eating slowly may help prevent nausea.There are also some antinausea medicines that may help prevent nausea. HOME CARE INSTRUCTIONS   Take all medicine as directed by your caregiver.  If you do not have an appetite, do not force yourself to eat. However, you must continue to drink fluids.  If you have an appetite, eat a normal diet unless your caregiver tells you differently.  Eat a variety of complex carbohydrates (rice, wheat, potatoes, bread), lean meats, yogurt, fruits,   and vegetables.  Avoid high-fat foods because they are more difficult to digest.  Drink enough water and fluids to keep your urine clear or pale yellow.  If you are dehydrated, ask your caregiver for specific rehydration instructions. Signs of dehydration may include:  Severe thirst.  Dry lips and mouth.  Dizziness.  Dark urine.  Decreasing urine frequency and amount.  Confusion.  Rapid breathing or pulse. SEEK IMMEDIATE MEDICAL CARE IF:   You have blood or brown flecks (like coffee grounds) in your vomit.  You have black or bloody stools.  You have a severe headache or stiff  neck.  You are confused.  You have severe abdominal pain.  You have chest pain or trouble breathing.  You do not urinate at least once every 8 hours.  You develop cold or clammy skin.  You continue to vomit for longer than 24 to 48 hours.  You have a fever. MAKE SURE YOU:   Understand these instructions.  Will watch your condition.  Will get help right away if you are not doing well or get worse.   This information is not intended to replace advice given to you by your health care provider. Make sure you discuss any questions you have with your health care provider.   Document Released: 05/31/2005 Document Revised: 08/23/2011 Document Reviewed: 10/28/2010 Elsevier Interactive Patient Education 2016 Elsevier Inc.  

## 2015-07-12 LAB — CULTURE, BLOOD (ROUTINE X 2)
CULTURE: NO GROWTH
Culture: NO GROWTH

## 2015-07-17 ENCOUNTER — Encounter: Payer: Self-pay | Admitting: *Deleted

## 2015-07-18 ENCOUNTER — Ambulatory Visit: Payer: Medicare Other | Admitting: Anesthesiology

## 2015-07-18 ENCOUNTER — Encounter
Admission: RE | Disposition: A | Discharge status: Home / Self Care | Payer: Self-pay | Source: Ambulatory Visit | Attending: Gastroenterology

## 2015-07-18 ENCOUNTER — Ambulatory Visit
Admission: RE | Admit: 2015-07-18 | Discharge: 2015-07-18 | Disposition: A | Discharge status: Home / Self Care | Payer: Medicare Other | Source: Ambulatory Visit | Attending: Gastroenterology | Admitting: Gastroenterology

## 2015-07-18 ENCOUNTER — Encounter: Payer: Self-pay | Admitting: *Deleted

## 2015-07-18 DIAGNOSIS — Z825 Family history of asthma and other chronic lower respiratory diseases: Secondary | ICD-10-CM | POA: Insufficient documentation

## 2015-07-18 DIAGNOSIS — Z79899 Other long term (current) drug therapy: Secondary | ICD-10-CM | POA: Insufficient documentation

## 2015-07-18 DIAGNOSIS — Z809 Family history of malignant neoplasm, unspecified: Secondary | ICD-10-CM | POA: Insufficient documentation

## 2015-07-18 DIAGNOSIS — K449 Diaphragmatic hernia without obstruction or gangrene: Secondary | ICD-10-CM | POA: Diagnosis not present

## 2015-07-18 DIAGNOSIS — E785 Hyperlipidemia, unspecified: Secondary | ICD-10-CM | POA: Diagnosis not present

## 2015-07-18 DIAGNOSIS — J309 Allergic rhinitis, unspecified: Secondary | ICD-10-CM | POA: Insufficient documentation

## 2015-07-18 DIAGNOSIS — Z87891 Personal history of nicotine dependence: Secondary | ICD-10-CM | POA: Diagnosis not present

## 2015-07-18 DIAGNOSIS — Z86010 Personal history of colonic polyps: Secondary | ICD-10-CM | POA: Diagnosis not present

## 2015-07-18 DIAGNOSIS — Z90710 Acquired absence of both cervix and uterus: Secondary | ICD-10-CM | POA: Insufficient documentation

## 2015-07-18 DIAGNOSIS — Z8249 Family history of ischemic heart disease and other diseases of the circulatory system: Secondary | ICD-10-CM | POA: Insufficient documentation

## 2015-07-18 DIAGNOSIS — I509 Heart failure, unspecified: Secondary | ICD-10-CM | POA: Insufficient documentation

## 2015-07-18 DIAGNOSIS — E119 Type 2 diabetes mellitus without complications: Secondary | ICD-10-CM | POA: Diagnosis not present

## 2015-07-18 DIAGNOSIS — K222 Esophageal obstruction: Secondary | ICD-10-CM | POA: Insufficient documentation

## 2015-07-18 DIAGNOSIS — Z96642 Presence of left artificial hip joint: Secondary | ICD-10-CM | POA: Insufficient documentation

## 2015-07-18 DIAGNOSIS — I272 Other secondary pulmonary hypertension: Secondary | ICD-10-CM | POA: Insufficient documentation

## 2015-07-18 DIAGNOSIS — K210 Gastro-esophageal reflux disease with esophagitis: Secondary | ICD-10-CM | POA: Diagnosis not present

## 2015-07-18 DIAGNOSIS — Z7951 Long term (current) use of inhaled steroids: Secondary | ICD-10-CM | POA: Diagnosis not present

## 2015-07-18 DIAGNOSIS — Z9049 Acquired absence of other specified parts of digestive tract: Secondary | ICD-10-CM | POA: Diagnosis not present

## 2015-07-18 DIAGNOSIS — Z8541 Personal history of malignant neoplasm of cervix uteri: Secondary | ICD-10-CM | POA: Diagnosis not present

## 2015-07-18 DIAGNOSIS — F419 Anxiety disorder, unspecified: Secondary | ICD-10-CM | POA: Diagnosis not present

## 2015-07-18 DIAGNOSIS — R1310 Dysphagia, unspecified: Secondary | ICD-10-CM | POA: Insufficient documentation

## 2015-07-18 HISTORY — PX: ESOPHAGOGASTRODUODENOSCOPY: SHX5428

## 2015-07-18 HISTORY — DX: Heart failure, unspecified: I50.9

## 2015-07-18 HISTORY — DX: Unspecified osteoarthritis, unspecified site: M19.90

## 2015-07-18 HISTORY — DX: Anxiety disorder, unspecified: F41.9

## 2015-07-18 HISTORY — DX: Malignant (primary) neoplasm, unspecified: C80.1

## 2015-07-18 LAB — GLUCOSE, CAPILLARY: Glucose-Capillary: 108 mg/dL — ABNORMAL HIGH (ref 65–99)

## 2015-07-18 SURGERY — EGD (ESOPHAGOGASTRODUODENOSCOPY)
Anesthesia: General

## 2015-07-18 MED ORDER — MIDAZOLAM HCL 2 MG/2ML IJ SOLN
INTRAMUSCULAR | Status: AC | PRN
  Administered 2015-07-18 – 2016-04-07 (×3): 1 mg via INTRAVENOUS

## 2015-07-18 MED ORDER — SODIUM CHLORIDE 0.9 % IV SOLN
INTRAVENOUS | Status: DC
  Administered 2015-07-18: 08:00:00 via INTRAVENOUS

## 2015-07-18 MED ORDER — PROPOFOL 500 MG/50ML IV EMUL
INTRAVENOUS | Status: AC | PRN
  Administered 2015-07-18: 120 ug/kg/min via INTRAVENOUS

## 2015-07-18 MED ORDER — PROPOFOL 10 MG/ML IV BOLUS
INTRAVENOUS | Status: AC | PRN
  Administered 2015-07-18: 20 mg via INTRAVENOUS

## 2015-07-18 MED ORDER — SODIUM CHLORIDE 0.9 % IV SOLN: INTRAVENOUS | Status: DC

## 2015-07-18 MED ORDER — FENTANYL CITRATE (PF) 100 MCG/2ML IJ SOLN
INTRAMUSCULAR | Status: AC | PRN
Start: 2015-07-18 — End: ?
  Administered 2015-07-18 – 2016-04-07 (×2): 50 ug via INTRAVENOUS

## 2015-07-18 NOTE — H&P (Signed)
  Date of Initial H&P: 06/20/2015  History reviewed, patient examined, no change in status, stable for surgery.

## 2015-07-18 NOTE — Op Note (Signed)
[facility] Gastroenterology Patient Name: [name] Procedure Date: 07/18/2015 8:38 AM MRN: [id number] Account #: [id number] Date of Birth: 09-14-1932 Admit Type: Outpatient Age: 82 Room: [facility] ENDO ROOM 4 Gender: Female Note Status: Finalized Procedure:         Upper GI endoscopy Indications:       Dysphagia, Possible hx of achalasia and Heller's myotomy? Providers:         Lupita Dawn. Candace Cruise, MD Referring MD:      Ocie Cornfield. Ouida Sills, MD (Referring MD) Medicines:         Monitored Anesthesia Care Complications:     No immediate complications. Procedure:         Pre-Anesthesia Assessment:                    - Prior to the procedure, a History and Physical was                     performed, and patient medications, allergies and                     sensitivities were reviewed. The patient's tolerance of                     previous anesthesia was reviewed.                    - The risks and benefits of the procedure and the sedation                     options and risks were discussed with the patient. All                     questions were answered and informed consent was obtained.                    - After reviewing the risks and benefits, the patient was                     deemed in satisfactory condition to undergo the procedure.                    After obtaining informed consent, the endoscope was passed                     under direct vision. Throughout the procedure, the                     patient's blood pressure, pulse, and oxygen saturations                     were monitored continuously. The Olympus GIF-160 endoscope                     (S#. [id number]) was introduced through the mouth, and                     advanced to the second part of duodenum. The upper GI                     endoscopy was accomplished without difficulty. The patient                     tolerated the procedure well. Findings:      One mild benign-appearing, intrinsic stenosis  was found at the       gastroesophageal junction. The scope was withdrawn. Dilation was       performed with a Maloney dilator with mild resistance at 15 Fr.      LA Grade A (one or more mucosal breaks less than 5 mm, not extending       between tops of 2 mucosal folds) esophagitis was found at the       gastroesophageal junction. Biopsies were taken with a cold forceps for       histology.      The exam was otherwise without abnormality.      The entire examined stomach was normal.      The examined duodenum was normal. Impression:        - Benign-appearing esophageal stenosis. Dilated.                    - LA Grade A reflux esophagitis. Biopsied.                    - The examination was otherwise normal.                    - Normal stomach.                    - Normal examined duodenum. Recommendation:    - Discharge patient to home.                    - Observe patient's clinical course.                    - Await pathology results.                    - Continue present medications.                    - The findings and recommendations were discussed with the                     patient. Procedure Code(s): --- Professional ---                    [phone], Esophagogastroduodenoscopy, flexible, transoral;                     with biopsy, single or multiple                    43450, Dilation of esophagus, by unguided sound or bougie,                     single or multiple passes Diagnosis Code(s): --- Professional ---                    K22.2, Esophageal obstruction                    K21.0, Gastro-esophageal reflux disease with esophagitis                    R13.10, Dysphagia, unspecified CPT copyright 2014 American Medical Association. All rights reserved. The codes documented in this report are preliminary and upon coder review may  be revised to meet current compliance requirements. Hulen Luster, MD 07/18/2015 8:51:00 AM This report has been signed electronically. Number of Addenda:  0 Note Initiated On: 07/18/2015 8:38 AM  [facility]

## 2015-07-18 NOTE — Anesthesia Preprocedure Evaluation (Signed)
Anesthesia Evaluation  Patient identified by MRN, date of birth, ID band Patient awake    Reviewed: Allergy & Precautions, NPO status , Patient's Chart, lab work & pertinent test results  Airway Mallampati: II       Dental  (+) Edentulous Upper, Edentulous Lower   Pulmonary former smoker,    breath sounds clear to auscultation       Cardiovascular hypertension, Pt. on home beta blockers +CHF   Rhythm:Regular     Neuro/Psych    GI/Hepatic GERD  ,  Endo/Other  diabetes, Well Controlled, Type 2, Oral Hypoglycemic Agents  Renal/GU      Musculoskeletal   Abdominal Normal abdominal exam  (+)   Peds  Hematology   Anesthesia Other Findings   Reproductive/Obstetrics                             Anesthesia Physical Anesthesia Plan  ASA: II  Anesthesia Plan: General   Post-op Pain Management:    Induction: Intravenous  Airway Management Planned: Natural Airway and Nasal Cannula  Additional Equipment:   Intra-op Plan:   Post-operative Plan:   Informed Consent: I have reviewed the patients History and Physical, chart, labs and discussed the procedure including the risks, benefits and alternatives for the proposed anesthesia with the patient or authorized representative who has indicated his/her understanding and acceptance.     Plan Discussed with: CRNA  Anesthesia Plan Comments:         Anesthesia Quick Evaluation

## 2015-07-18 NOTE — Transfer of Care (Signed)
Immediate Anesthesia Transfer of Care Note  Patient: [name]  Procedure(s) Performed: Procedure(s): ESOPHAGOGASTRODUODENOSCOPY (EGD) (N/A)  Patient Location: PACU  Anesthesia Type:General  Level of Consciousness: awake and alert   Airway & Oxygen Therapy: Patient Spontanous Breathing, Patient connected to nasal cannula oxygen and Patient connected to face mask oxygen  Post-op Assessment: Report given to RN  Post vital signs: Reviewed and stable  Last Vitals:  Filed Vitals:   07/18/15 0748  BP: 130/73  Pulse: 77  Temp: 36 C  Resp: 16    Complications: No apparent anesthesia complications

## 2015-07-21 LAB — SURGICAL PATHOLOGY

## 2015-07-23 NOTE — Anesthesia Postprocedure Evaluation (Signed)
Anesthesia Post Note  Patient: [name]  Procedure(s) Performed: Procedure(s) (LRB): ESOPHAGOGASTRODUODENOSCOPY (EGD) (N/A)  Patient location during evaluation: PACU Anesthesia Type: General Level of consciousness: awake Pain management: satisfactory to patient Vital Signs Assessment: post-procedure vital signs reviewed and stable Respiratory status: spontaneous breathing Cardiovascular status: stable Anesthetic complications: no    Last Vitals:  Filed Vitals:   07/18/15 0913 07/18/15 0923  BP: 135/71 161/92  Pulse: 69 71  Temp:    Resp: 17 12    Last Pain: There were no vitals filed for this visit.               VAN STAVEREN,Merdith Boyd

## 2015-10-17 ENCOUNTER — Other Ambulatory Visit: Payer: Self-pay | Admitting: Internal Medicine

## 2015-10-17 ENCOUNTER — Ambulatory Visit
Admission: RE | Admit: 2015-10-17 | Discharge: 2015-10-17 | Disposition: A | Discharge status: Home / Self Care | Payer: Medicare Other | Source: Ambulatory Visit | Attending: Internal Medicine | Admitting: Internal Medicine

## 2015-10-17 DIAGNOSIS — R51 Headache: Secondary | ICD-10-CM | POA: Insufficient documentation

## 2015-10-17 DIAGNOSIS — R270 Ataxia, unspecified: Secondary | ICD-10-CM | POA: Diagnosis not present

## 2015-10-17 DIAGNOSIS — R519 Headache, unspecified: Secondary | ICD-10-CM

## 2015-10-17 DIAGNOSIS — G319 Degenerative disease of nervous system, unspecified: Secondary | ICD-10-CM | POA: Insufficient documentation

## 2016-01-16 NOTE — Discharge Instructions (Signed)

## 2016-01-19 ENCOUNTER — Encounter: Payer: Self-pay | Admitting: Anesthesiology

## 2016-01-19 ENCOUNTER — Encounter: Payer: Self-pay | Admitting: *Deleted

## 2016-02-11 ENCOUNTER — Ambulatory Visit: Admission: RE | Admit: 2016-02-11 | Payer: Medicare Other | Source: Ambulatory Visit | Admitting: Ophthalmology

## 2016-02-11 HISTORY — DX: Dizziness and giddiness: R42

## 2016-02-11 HISTORY — DX: Presence of dental prosthetic device (complete) (partial): Z97.2

## 2016-02-11 HISTORY — DX: Reserved for inherently not codable concepts without codable children: IMO0001

## 2016-02-11 SURGERY — PHACOEMULSIFICATION, CATARACT, WITH IOL INSERTION
Anesthesia: Topical | Laterality: Right

## 2016-03-05 ENCOUNTER — Encounter: Payer: Self-pay | Admitting: *Deleted

## 2016-03-08 NOTE — Discharge Instructions (Signed)

## 2016-03-10 ENCOUNTER — Ambulatory Visit: Payer: Medicare Other | Admitting: Anesthesiology

## 2016-03-10 ENCOUNTER — Encounter
Admission: RE | Disposition: A | Discharge status: Home / Self Care | Payer: Self-pay | Source: Ambulatory Visit | Attending: Ophthalmology

## 2016-03-10 ENCOUNTER — Encounter: Payer: Self-pay | Admitting: *Deleted

## 2016-03-10 ENCOUNTER — Ambulatory Visit
Admission: RE | Admit: 2016-03-10 | Discharge: 2016-03-10 | Disposition: A | Discharge status: Home / Self Care | Payer: Medicare Other | Source: Ambulatory Visit | Attending: Ophthalmology | Admitting: Ophthalmology

## 2016-03-10 DIAGNOSIS — Z9981 Dependence on supplemental oxygen: Secondary | ICD-10-CM | POA: Insufficient documentation

## 2016-03-10 DIAGNOSIS — H2511 Age-related nuclear cataract, right eye: Secondary | ICD-10-CM | POA: Diagnosis present

## 2016-03-10 DIAGNOSIS — Z96649 Presence of unspecified artificial hip joint: Secondary | ICD-10-CM | POA: Insufficient documentation

## 2016-03-10 DIAGNOSIS — Z87891 Personal history of nicotine dependence: Secondary | ICD-10-CM | POA: Insufficient documentation

## 2016-03-10 DIAGNOSIS — E1140 Type 2 diabetes mellitus with diabetic neuropathy, unspecified: Secondary | ICD-10-CM | POA: Diagnosis not present

## 2016-03-10 DIAGNOSIS — H9191 Unspecified hearing loss, right ear: Secondary | ICD-10-CM | POA: Insufficient documentation

## 2016-03-10 DIAGNOSIS — F329 Major depressive disorder, single episode, unspecified: Secondary | ICD-10-CM | POA: Insufficient documentation

## 2016-03-10 DIAGNOSIS — Z9049 Acquired absence of other specified parts of digestive tract: Secondary | ICD-10-CM | POA: Diagnosis not present

## 2016-03-10 DIAGNOSIS — M7989 Other specified soft tissue disorders: Secondary | ICD-10-CM | POA: Insufficient documentation

## 2016-03-10 DIAGNOSIS — I509 Heart failure, unspecified: Secondary | ICD-10-CM | POA: Insufficient documentation

## 2016-03-10 DIAGNOSIS — E7800 Pure hypercholesterolemia, unspecified: Secondary | ICD-10-CM | POA: Diagnosis not present

## 2016-03-10 DIAGNOSIS — J449 Chronic obstructive pulmonary disease, unspecified: Secondary | ICD-10-CM | POA: Diagnosis not present

## 2016-03-10 DIAGNOSIS — K449 Diaphragmatic hernia without obstruction or gangrene: Secondary | ICD-10-CM | POA: Insufficient documentation

## 2016-03-10 DIAGNOSIS — K219 Gastro-esophageal reflux disease without esophagitis: Secondary | ICD-10-CM | POA: Insufficient documentation

## 2016-03-10 DIAGNOSIS — Z90710 Acquired absence of both cervix and uterus: Secondary | ICD-10-CM | POA: Insufficient documentation

## 2016-03-10 DIAGNOSIS — H5703 Miosis: Secondary | ICD-10-CM | POA: Diagnosis not present

## 2016-03-10 DIAGNOSIS — E1136 Type 2 diabetes mellitus with diabetic cataract: Secondary | ICD-10-CM | POA: Insufficient documentation

## 2016-03-10 DIAGNOSIS — Z885 Allergy status to narcotic agent status: Secondary | ICD-10-CM | POA: Diagnosis not present

## 2016-03-10 DIAGNOSIS — I110 Hypertensive heart disease with heart failure: Secondary | ICD-10-CM | POA: Diagnosis not present

## 2016-03-10 HISTORY — PX: CATARACT EXTRACTION W/PHACO: SHX586

## 2016-03-10 HISTORY — DX: Other specified postprocedural states: Z98.890

## 2016-03-10 HISTORY — DX: Nausea with vomiting, unspecified: R11.2

## 2016-03-10 LAB — GLUCOSE, CAPILLARY
GLUCOSE-CAPILLARY: 111 mg/dL — AB (ref 65–99)
Glucose-Capillary: 113 mg/dL — ABNORMAL HIGH (ref 65–99)

## 2016-03-10 SURGERY — PHACOEMULSIFICATION, CATARACT, WITH IOL INSERTION
Anesthesia: Monitor Anesthesia Care | Laterality: Right | Wound class: Clean

## 2016-03-10 MED ORDER — MOXIFLOXACIN HCL 0.5 % OP SOLN
1.0000 [drp] | OPHTHALMIC | Status: DC | PRN
  Administered 2016-03-10 (×3): 1 [drp] via OPHTHALMIC

## 2016-03-10 MED ORDER — LACTATED RINGERS IV SOLN: INTRAVENOUS | Status: DC

## 2016-03-10 MED ORDER — ONDANSETRON HCL 4 MG/2ML IJ SOLN
4.0000 mg | Freq: Once | INTRAMUSCULAR | Status: AC
  Administered 2016-03-10: 4 mg via INTRAVENOUS

## 2016-03-10 MED ORDER — FENTANYL CITRATE (PF) 100 MCG/2ML IJ SOLN
INTRAMUSCULAR | Status: DC | PRN
  Administered 2016-03-10: 50 ug via INTRAVENOUS

## 2016-03-10 MED ORDER — TIMOLOL MALEATE 0.5 % OP SOLN
OPHTHALMIC | Status: DC | PRN
  Administered 2016-03-10: 1 [drp] via OPHTHALMIC

## 2016-03-10 MED ORDER — BRIMONIDINE TARTRATE 0.2 % OP SOLN
OPHTHALMIC | Status: DC | PRN
  Administered 2016-03-10: 1 [drp] via OPHTHALMIC

## 2016-03-10 MED ORDER — BALANCED SALT IO SOLN
INTRAOCULAR | Status: DC | PRN
  Administered 2016-03-10: 1 mL via OPHTHALMIC

## 2016-03-10 MED ORDER — EPINEPHRINE HCL 1 MG/ML IJ SOLN
INTRAOCULAR | Status: DC | PRN
  Administered 2016-03-10: 50 mL via OPHTHALMIC

## 2016-03-10 MED ORDER — NA HYALUR & NA CHOND-NA HYALUR 0.4-0.35 ML IO KIT
PACK | INTRAOCULAR | Status: DC | PRN
  Administered 2016-03-10: 1 mL via INTRAOCULAR

## 2016-03-10 MED ORDER — ARMC OPHTHALMIC DILATING DROPS
1.0000 "application " | OPHTHALMIC | Status: DC | PRN
  Administered 2016-03-10 (×3): 1 via OPHTHALMIC

## 2016-03-10 MED ORDER — MIDAZOLAM HCL 2 MG/2ML IJ SOLN
INTRAMUSCULAR | Status: DC | PRN
  Administered 2016-03-10 (×2): 1 mg via INTRAVENOUS

## 2016-03-10 MED ORDER — CEFUROXIME OPHTHALMIC INJECTION 1 MG/0.1 ML
INJECTION | OPHTHALMIC | Status: DC | PRN
  Administered 2016-03-10: 0.1 mL via OPHTHALMIC

## 2016-03-10 SURGICAL SUPPLY — 25 items
CANNULA ANT/CHMB 27GA (MISCELLANEOUS) ×3 IMPLANT
CARTRIDGE ABBOTT (MISCELLANEOUS) IMPLANT
GLOVE SURG LX 7.5 STRW (GLOVE) ×2
GLOVE SURG LX STRL 7.5 STRW (GLOVE) ×1 IMPLANT
GLOVE SURG TRIUMPH 8.0 PF LTX (GLOVE) ×3 IMPLANT
GOWN STRL REUS W/ TWL LRG LVL3 (GOWN DISPOSABLE) ×2 IMPLANT
GOWN STRL REUS W/TWL LRG LVL3 (GOWN DISPOSABLE) ×4
LENS IOL TECNIS ITEC 23.5 (Intraocular Lens) ×3 IMPLANT
MARKER SKIN DUAL TIP RULER LAB (MISCELLANEOUS) ×3 IMPLANT
NDL RETROBULBAR .5 NSTRL (NEEDLE) IMPLANT
NEEDLE FILTER BLUNT 18X 1/2SAF (NEEDLE) ×2
NEEDLE FILTER BLUNT 18X1 1/2 (NEEDLE) ×1 IMPLANT
PACK CATARACT BRASINGTON (MISCELLANEOUS) ×3 IMPLANT
PACK EYE AFTER SURG (MISCELLANEOUS) ×3 IMPLANT
PACK OPTHALMIC (MISCELLANEOUS) ×3 IMPLANT
RING MALYGIN 7.0 (MISCELLANEOUS) ×3 IMPLANT
SUT ETHILON 10-0 CS-B-6CS-B-6 (SUTURE)
SUT VICRYL  9 0 (SUTURE)
SUT VICRYL 9 0 (SUTURE) IMPLANT
SUTURE EHLN 10-0 CS-B-6CS-B-6 (SUTURE) IMPLANT
SYR 3ML LL SCALE MARK (SYRINGE) ×3 IMPLANT
SYR 5ML LL (SYRINGE) ×3 IMPLANT
SYR TB 1ML LUER SLIP (SYRINGE) ×3 IMPLANT
WATER STERILE IRR 250ML POUR (IV SOLUTION) ×3 IMPLANT
WIPE NON LINTING 3.25X3.25 (MISCELLANEOUS) ×3 IMPLANT

## 2016-03-10 NOTE — Anesthesia Preprocedure Evaluation (Signed)
Anesthesia Evaluation  Patient identified by MRN, date of birth, ID band Patient awake    Reviewed: Allergy & Precautions, H&P , NPO status , Patient's Chart, lab work & pertinent test results  History of Anesthesia Complications Negative for: history of anesthetic complications  Airway Mallampati: II  TM Distance: >3 FB Neck ROM: full    Dental no notable dental hx.    Pulmonary shortness of breath, former smoker,    Pulmonary exam normal        Cardiovascular hypertension, On Medications +CHF  Normal cardiovascular exam     Neuro/Psych negative neurological ROS     GI/Hepatic Neg liver ROS, Medicated,  Endo/Other  diabetes, Well Controlled, Type 2  Renal/GU negative Renal ROS  negative genitourinary   Musculoskeletal   Abdominal   Peds  Hematology negative hematology ROS (+)   Anesthesia Other Findings   Reproductive/Obstetrics                             Anesthesia Physical Anesthesia Plan  ASA: III  Anesthesia Plan: MAC   Post-op Pain Management:    Induction:   Airway Management Planned:   Additional Equipment:   Intra-op Plan:   Post-operative Plan:   Informed Consent:   Plan Discussed with:   Anesthesia Plan Comments:         Anesthesia Quick Evaluation

## 2016-03-10 NOTE — H&P (Signed)
The History and Physical notes are on paper, have been signed, and are to be scanned. The patient remains stable and unchanged from the H&P.   Previous H&P reviewed, patient examined, and there are no changes.  [name] 03/10/2016 8:32 AM

## 2016-03-10 NOTE — Anesthesia Postprocedure Evaluation (Signed)
Anesthesia Post Note  Patient: [name]  Procedure(s) Performed: Procedure(s) (LRB): CATARACT EXTRACTION PHACO AND INTRAOCULAR LENS PLACEMENT (IOC) (Right)  Patient location during evaluation: PACU Anesthesia Type: MAC Level of consciousness: awake and alert Pain management: pain level controlled Vital Signs Assessment: post-procedure vital signs reviewed and stable Respiratory status: spontaneous breathing Cardiovascular status: blood pressure returned to baseline Postop Assessment: no headache Anesthetic complications: no    Jaci Standard, III,  Trinh Sanjose D

## 2016-03-10 NOTE — Op Note (Signed)
OPERATIVE NOTE  [name] [id number] 03/10/2016   PREOPERATIVE DIAGNOSIS:    Nuclear Sclerotic Cataract Right eye with miotic pupil.        H25.11  POSTOPERATIVE DIAGNOSIS: Nuclear Sclerotic Cataract Right eye with miotic pupil.          PROCEDURE:  Phacoemusification with posterior chamber intraocular lens placement of the right eye which required pupil stretching with the Malyugin pupil expansion device.  LENS:   Implant Name Type Inv. Item Serial No. Manufacturer Lot No. LRB No. Used  LENS IOL DIOP 23.5 - [id number] Intraocular Lens LENS IOL DIOP 23.5 [id number]   Right 1       ULTRASOUND TIME: 20 % of 0 minutes 50 seconds, CDE 10.0  SURGEON:  Wyonia Hough, MD   ANESTHESIA:  Topical with tetracaine drops and 2% Xylocaine jelly, augmented with 1% preservative-free intracameral lidocaine.   COMPLICATIONS:  None.   DESCRIPTION OF PROCEDURE:  The patient was identified in the holding room and transported to the operating room and placed in the supine position under the operating microscope. Theright eye was identified as the operative eye and it was prepped and draped in the usual sterile ophthalmic fashion.   A 1 millimeter clear-corneal paracentesis was made at the 12:00 position.  0.5 ml of preservative-free 1% lidocaine was injected into the anterior chamber. The anterior chamber was filled with Viscoat viscoelastic.  A 2.4 millimeter keratome was used to make a near-clear corneal incision at the 9:00 position. A Malyugin pupil expander was then placed through the main incision and into the anterior chamber of the eye.  The edge of the iris was secured on the lip of the pupil expander and it was released, thereby expanding the pupil to approximately 8 millimeters for completion of the cataract surgery.  Additional Viscoat was placed in the anterior chamber.  A cystotome and capsulorrhexis forceps were used to make a curvilinear capsulorrhexis.   Balanced salt  solution was used to hydrodissect and hydrodelineate the lens nucleus.   Phacoemulsification was used in stop and chop fashion to remove the lens, nucleus and epinucleus.  The remaining cortex was aspirated using the irrigation aspiration handpiece.  Additional Provisc was placed into the eye to distend the capsular bag for lens placement.  A lens was then injected into the capsular bag.  The pupil expanding ring was removed using a Kuglen hook and insertion device. The remaining viscoelastic was aspirated from the capsular bag and the anterior chamber.  The anterior chamber was filled with balanced salt solution to inflate to a physiologic pressure.  Wounds were hydrated with balanced salt solution.  The anterior chamber was inflated to a physiologic pressure with balanced salt solution.  No wound leaks were noted.Cefuroxime 0.1 ml of a 10mg /ml solution was injected into the anterior chamber for a dose of 1 mg of intracameral antibiotic at the completion of the case. Timolol and Brimonidine drops were applied to the eye.  The patient was taken to the recovery room in stable condition without complications of anesthesia or surgery.  [name] 03/10/2016, 9:00 AM

## 2016-03-10 NOTE — Transfer of Care (Signed)
Immediate Anesthesia Transfer of Care Note  Patient: [name]  Procedure(s) Performed: Procedure(s) with comments: CATARACT EXTRACTION PHACO AND INTRAOCULAR LENS PLACEMENT (IOC) (Right) - DIABETIC - oral meds  Patient Location: PACU  Anesthesia Type: MAC  Level of Consciousness: awake, alert  and patient cooperative  Airway and Oxygen Therapy: Patient Spontanous Breathing and Patient connected to supplemental oxygen  Post-op Assessment: Post-op Vital signs reviewed, Patient's Cardiovascular Status Stable, Respiratory Function Stable, Patent Airway and No signs of Nausea or vomiting  Post-op Vital Signs: Reviewed and stable  Complications: No apparent anesthesia complications

## 2016-03-10 NOTE — Anesthesia Procedure Notes (Signed)
Procedure Name: MAC Performed by: Maribell Demeo Pre-anesthesia Checklist: Patient identified, Emergency Drugs available, Suction available, Timeout performed and Patient being monitored Patient Re-evaluated:Patient Re-evaluated prior to inductionOxygen Delivery Method: Nasal cannula Placement Confirmation: positive ETCO2     

## 2016-03-11 ENCOUNTER — Encounter: Payer: Self-pay | Admitting: Ophthalmology

## 2016-03-30 NOTE — Discharge Instructions (Signed)

## 2016-04-07 ENCOUNTER — Encounter
Admission: RE | Disposition: A | Discharge status: Home / Self Care | Payer: Self-pay | Source: Ambulatory Visit | Attending: Ophthalmology

## 2016-04-07 ENCOUNTER — Ambulatory Visit: Payer: Medicare Other | Admitting: Anesthesiology

## 2016-04-07 ENCOUNTER — Ambulatory Visit
Admission: RE | Admit: 2016-04-07 | Discharge: 2016-04-07 | Disposition: A | Discharge status: Home / Self Care | Payer: Medicare Other | Source: Ambulatory Visit | Attending: Ophthalmology | Admitting: Ophthalmology

## 2016-04-07 DIAGNOSIS — I509 Heart failure, unspecified: Secondary | ICD-10-CM | POA: Insufficient documentation

## 2016-04-07 DIAGNOSIS — H2512 Age-related nuclear cataract, left eye: Secondary | ICD-10-CM | POA: Insufficient documentation

## 2016-04-07 DIAGNOSIS — Z87891 Personal history of nicotine dependence: Secondary | ICD-10-CM | POA: Insufficient documentation

## 2016-04-07 DIAGNOSIS — K219 Gastro-esophageal reflux disease without esophagitis: Secondary | ICD-10-CM | POA: Diagnosis not present

## 2016-04-07 DIAGNOSIS — Z9981 Dependence on supplemental oxygen: Secondary | ICD-10-CM | POA: Insufficient documentation

## 2016-04-07 DIAGNOSIS — J449 Chronic obstructive pulmonary disease, unspecified: Secondary | ICD-10-CM | POA: Diagnosis not present

## 2016-04-07 DIAGNOSIS — I110 Hypertensive heart disease with heart failure: Secondary | ICD-10-CM | POA: Diagnosis not present

## 2016-04-07 HISTORY — DX: Depression, unspecified: F32.A

## 2016-04-07 HISTORY — DX: Personal history of other diseases of the digestive system: Z87.19

## 2016-04-07 HISTORY — DX: Chronic obstructive pulmonary disease, unspecified: J44.9

## 2016-04-07 HISTORY — DX: Other cervical disc degeneration, unspecified cervical region: M50.30

## 2016-04-07 HISTORY — DX: Myoneural disorder, unspecified: G70.9

## 2016-04-07 HISTORY — PX: CATARACT EXTRACTION W/PHACO: SHX586

## 2016-04-07 HISTORY — DX: Major depressive disorder, single episode, unspecified: F32.9

## 2016-04-07 HISTORY — DX: Unspecified hearing loss, right ear: H91.91

## 2016-04-07 LAB — GLUCOSE, CAPILLARY
GLUCOSE-CAPILLARY: 110 mg/dL — AB (ref 65–99)
GLUCOSE-CAPILLARY: 119 mg/dL — AB (ref 65–99)

## 2016-04-07 SURGERY — PHACOEMULSIFICATION, CATARACT, WITH IOL INSERTION
Anesthesia: Monitor Anesthesia Care | Laterality: Left | Wound class: Clean

## 2016-04-07 MED ORDER — LIDOCAINE HCL (PF) 4 % IJ SOLN
INTRAMUSCULAR | Status: DC | PRN
  Administered 2016-04-07: 2 mL via OPHTHALMIC

## 2016-04-07 MED ORDER — EPINEPHRINE PF 1 MG/ML IJ SOLN
INTRAMUSCULAR | Status: DC | PRN
  Administered 2016-04-07: 65 mL via OPHTHALMIC

## 2016-04-07 MED ORDER — LACTATED RINGERS IV SOLN: INTRAVENOUS | Status: DC

## 2016-04-07 MED ORDER — BRIMONIDINE TARTRATE 0.2 % OP SOLN
OPHTHALMIC | Status: DC | PRN
  Administered 2016-04-07: 1 [drp] via OPHTHALMIC

## 2016-04-07 MED ORDER — ERYTHROMYCIN 5 MG/GM OP OINT
TOPICAL_OINTMENT | OPHTHALMIC | Status: DC | PRN
  Administered 2016-04-07: 1 via OPHTHALMIC

## 2016-04-07 MED ORDER — MOXIFLOXACIN HCL 0.5 % OP SOLN
1.0000 [drp] | OPHTHALMIC | Status: DC | PRN
  Administered 2016-04-07 (×3): 1 [drp] via OPHTHALMIC

## 2016-04-07 MED ORDER — ARMC OPHTHALMIC DILATING DROPS
1.0000 "application " | OPHTHALMIC | Status: DC | PRN
  Administered 2016-04-07 (×3): 1 via OPHTHALMIC

## 2016-04-07 MED ORDER — CEFUROXIME OPHTHALMIC INJECTION 1 MG/0.1 ML
INJECTION | OPHTHALMIC | Status: DC | PRN
  Administered 2016-04-07: .3 mg via OPHTHALMIC

## 2016-04-07 MED ORDER — TIMOLOL MALEATE 0.5 % OP SOLN
OPHTHALMIC | Status: DC | PRN
Start: 2016-04-07 — End: 2016-04-07
  Administered 2016-04-07: 1 [drp] via OPHTHALMIC

## 2016-04-07 MED ORDER — NA HYALUR & NA CHOND-NA HYALUR 0.4-0.35 ML IO KIT
PACK | INTRAOCULAR | Status: DC | PRN
  Administered 2016-04-07: 1 mL via INTRAOCULAR

## 2016-04-07 SURGICAL SUPPLY — 25 items
CANNULA ANT/CHMB 27GA (MISCELLANEOUS) ×3 IMPLANT
CARTRIDGE ABBOTT (MISCELLANEOUS) IMPLANT
GLOVE SURG LX 7.5 STRW (GLOVE) ×2
GLOVE SURG LX STRL 7.5 STRW (GLOVE) ×1 IMPLANT
GLOVE SURG TRIUMPH 8.0 PF LTX (GLOVE) ×3 IMPLANT
GOWN STRL REUS W/ TWL LRG LVL3 (GOWN DISPOSABLE) ×2 IMPLANT
GOWN STRL REUS W/TWL LRG LVL3 (GOWN DISPOSABLE) ×4
LENS IOL TECNIS ITEC 24.0 (Intraocular Lens) ×3 IMPLANT
MARKER SKIN DUAL TIP RULER LAB (MISCELLANEOUS) ×3 IMPLANT
NDL RETROBULBAR .5 NSTRL (NEEDLE) IMPLANT
NEEDLE FILTER BLUNT 18X 1/2SAF (NEEDLE) ×2
NEEDLE FILTER BLUNT 18X1 1/2 (NEEDLE) ×1 IMPLANT
PACK CATARACT BRASINGTON (MISCELLANEOUS) ×3 IMPLANT
PACK EYE AFTER SURG (MISCELLANEOUS) ×3 IMPLANT
PACK OPTHALMIC (MISCELLANEOUS) ×3 IMPLANT
RING MALYGIN 7.0 (MISCELLANEOUS) IMPLANT
SUT ETHILON 10-0 CS-B-6CS-B-6 (SUTURE)
SUT VICRYL  9 0 (SUTURE)
SUT VICRYL 9 0 (SUTURE) IMPLANT
SUTURE EHLN 10-0 CS-B-6CS-B-6 (SUTURE) IMPLANT
SYR 3ML LL SCALE MARK (SYRINGE) ×3 IMPLANT
SYR 5ML LL (SYRINGE) ×3 IMPLANT
SYR TB 1ML LUER SLIP (SYRINGE) ×3 IMPLANT
WATER STERILE IRR 250ML POUR (IV SOLUTION) ×3 IMPLANT
WIPE NON LINTING 3.25X3.25 (MISCELLANEOUS) ×3 IMPLANT

## 2016-04-07 NOTE — Anesthesia Preprocedure Evaluation (Signed)
Anesthesia Evaluation  Patient identified by MRN, date of birth, ID band Patient awake    Reviewed: Allergy & Precautions, H&P , NPO status , Patient's Chart, lab work & pertinent test results  Airway Mallampati: II  TM Distance: >3 FB Neck ROM: full    Dental  (+) Upper Dentures, Lower Dentures   Pulmonary shortness of breath, COPD,  oxygen dependent, former smoker,    Pulmonary exam normal        Cardiovascular hypertension, +CHF  Normal cardiovascular exam     Neuro/Psych    GI/Hepatic GERD  ,  Endo/Other  diabetes  Renal/GU      Musculoskeletal   Abdominal   Peds  Hematology   Anesthesia Other Findings   Reproductive/Obstetrics                             Anesthesia Physical Anesthesia Plan  ASA: IV  Anesthesia Plan: MAC   Post-op Pain Management:    Induction:   Airway Management Planned:   Additional Equipment:   Intra-op Plan:   Post-operative Plan:   Informed Consent: I have reviewed the patients History and Physical, chart, labs and discussed the procedure including the risks, benefits and alternatives for the proposed anesthesia with the patient or authorized representative who has indicated his/her understanding and acceptance.     Plan Discussed with:   Anesthesia Plan Comments:         Anesthesia Quick Evaluation

## 2016-04-07 NOTE — Transfer of Care (Signed)
Immediate Anesthesia Transfer of Care Note  Patient: [name]  Procedure(s) Performed: Procedure(s) with comments: CATARACT EXTRACTION PHACO AND INTRAOCULAR LENS PLACEMENT (IOC) (Left) - DIABETIC, oral med [place]  Patient Location: PACU  Anesthesia Type: MAC  Level of Consciousness: awake, alert  and patient cooperative  Airway and Oxygen Therapy: Patient Spontanous Breathing and Patient connected to supplemental oxygen  Post-op Assessment: Post-op Vital signs reviewed, Patient's Cardiovascular Status Stable, Respiratory Function Stable, Patent Airway and No signs of Nausea or vomiting  Post-op Vital Signs: Reviewed and stable  Complications: No apparent anesthesia complications

## 2016-04-07 NOTE — Anesthesia Procedure Notes (Signed)
Procedure Name: MAC Date/Time: 04/07/2016 7:50 AM Performed by: Cameron Ali Pre-anesthesia Checklist: Patient identified, Emergency Drugs available, Suction available, Timeout performed and Patient being monitored Patient Re-evaluated:Patient Re-evaluated prior to inductionOxygen Delivery Method: Nasal cannula Placement Confirmation: positive ETCO2

## 2016-04-07 NOTE — Op Note (Signed)
OPERATIVE NOTE  [name] [id number] 04/07/2016   PREOPERATIVE DIAGNOSIS:  Nuclear sclerotic cataract left eye. H25.12   POSTOPERATIVE DIAGNOSIS:    Nuclear sclerotic cataract left eye.     PROCEDURE:  Phacoemusification with posterior chamber intraocular lens placement of the left eye   LENS:   Implant Name Type Inv. Item Serial No. Manufacturer Lot No. LRB No. Used  LENS IOL DIOP 24.0 - [id number] Intraocular Lens LENS IOL DIOP 24.0 [id number]   Left 1        ULTRASOUND TIME: 14  % of 1 minutes 40 seconds, CDE 14.1  SURGEON:  Wyonia Hough, MD   ANESTHESIA:  Topical with tetracaine drops and 2% Xylocaine jelly, augmented with 1% preservative-free intracameral lidocaine.    COMPLICATIONS:  None.   DESCRIPTION OF PROCEDURE:  The patient was identified in the holding room and transported to the operating room and placed in the supine position under the operating microscope.  The left eye was identified as the operative eye and it was prepped and draped in the usual sterile ophthalmic fashion.   A 1 millimeter clear-corneal paracentesis was made at the 1:30 position.  0.5 ml of preservative-free 1% lidocaine was injected into the anterior chamber.  The anterior chamber was filled with Viscoat viscoelastic.  A 2.4 millimeter keratome was used to make a near-clear corneal incision at the 10:30 position.  .  A curvilinear capsulorrhexis was made with a cystotome and capsulorrhexis forceps.  Balanced salt solution was used to hydrodissect and hydrodelineate the nucleus.   Phacoemulsification was then used in stop and chop fashion to remove the lens nucleus and epinucleus.  The remaining cortex was then removed using the irrigation and aspiration handpiece. Provisc was then placed into the capsular bag to distend it for lens placement.  A lens was then injected into the capsular bag.  The remaining viscoelastic was aspirated.   Wounds were hydrated with balanced salt  solution.  The anterior chamber was inflated to a physiologic pressure with balanced salt solution.  No wound leaks were noted. Cefuroxime 0.1 ml of a 10mg /ml solution was injected into the anterior chamber for a dose of 1 mg of intracameral antibiotic at the completion of the case.   Timolol and Brimonidine drops were applied to the eye.  The patient was taken to the recovery room in stable condition without complications of anesthesia or surgery.  [name] 04/07/2016, 8:08 AM

## 2016-04-07 NOTE — Anesthesia Postprocedure Evaluation (Signed)
Anesthesia Post Note  Patient: [name]  Procedure(s) Performed: Procedure(s) (LRB): CATARACT EXTRACTION PHACO AND INTRAOCULAR LENS PLACEMENT (Orient) (Left)  Patient location during evaluation: PACU Anesthesia Type: MAC Level of consciousness: awake and alert and oriented Pain management: satisfactory to patient Vital Signs Assessment: post-procedure vital signs reviewed and stable Respiratory status: spontaneous breathing, nonlabored ventilation and respiratory function stable Cardiovascular status: blood pressure returned to baseline and stable Postop Assessment: Adequate PO intake and No signs of nausea or vomiting Anesthetic complications: no    Raliegh Ip

## 2016-04-07 NOTE — H&P (Signed)
The History and Physical notes are on paper, have been signed, and are to be scanned. The patient remains stable and unchanged from the H&P.   Previous H&P reviewed, patient examined, and there are no changes.  Jaggar Benko 04/07/2016 7:35 AM

## 2016-04-07 NOTE — Op Note (Signed)
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

## 2016-04-08 ENCOUNTER — Encounter: Payer: Self-pay | Admitting: Ophthalmology

## 2016-04-16 ENCOUNTER — Encounter: Payer: Self-pay | Admitting: Emergency Medicine

## 2016-04-16 ENCOUNTER — Emergency Department
Admission: EM | Admit: 2016-04-16 | Discharge: 2016-04-16 | Disposition: A | Discharge status: Home / Self Care | Payer: Medicare Other | Attending: Emergency Medicine | Admitting: Emergency Medicine

## 2016-04-16 ENCOUNTER — Emergency Department: Payer: Medicare Other

## 2016-04-16 DIAGNOSIS — Z79899 Other long term (current) drug therapy: Secondary | ICD-10-CM | POA: Diagnosis not present

## 2016-04-16 DIAGNOSIS — S83207A Unspecified tear of unspecified meniscus, current injury, left knee, initial encounter: Secondary | ICD-10-CM | POA: Diagnosis not present

## 2016-04-16 DIAGNOSIS — Y999 Unspecified external cause status: Secondary | ICD-10-CM | POA: Insufficient documentation

## 2016-04-16 DIAGNOSIS — W07XXXA Fall from chair, initial encounter: Secondary | ICD-10-CM | POA: Insufficient documentation

## 2016-04-16 DIAGNOSIS — Y9301 Activity, walking, marching and hiking: Secondary | ICD-10-CM | POA: Diagnosis not present

## 2016-04-16 DIAGNOSIS — M25569 Pain in unspecified knee: Secondary | ICD-10-CM

## 2016-04-16 DIAGNOSIS — Z7982 Long term (current) use of aspirin: Secondary | ICD-10-CM | POA: Insufficient documentation

## 2016-04-16 DIAGNOSIS — I509 Heart failure, unspecified: Secondary | ICD-10-CM | POA: Diagnosis not present

## 2016-04-16 DIAGNOSIS — J449 Chronic obstructive pulmonary disease, unspecified: Secondary | ICD-10-CM | POA: Insufficient documentation

## 2016-04-16 DIAGNOSIS — I110 Hypertensive heart disease with heart failure: Secondary | ICD-10-CM | POA: Insufficient documentation

## 2016-04-16 DIAGNOSIS — S83206A Unspecified tear of unspecified meniscus, current injury, right knee, initial encounter: Secondary | ICD-10-CM

## 2016-04-16 DIAGNOSIS — S8992XA Unspecified injury of left lower leg, initial encounter: Secondary | ICD-10-CM | POA: Diagnosis present

## 2016-04-16 DIAGNOSIS — Z87891 Personal history of nicotine dependence: Secondary | ICD-10-CM | POA: Insufficient documentation

## 2016-04-16 DIAGNOSIS — Y929 Unspecified place or not applicable: Secondary | ICD-10-CM | POA: Insufficient documentation

## 2016-04-16 MED ORDER — LORAZEPAM 2 MG/ML IJ SOLN
1.0000 mg | Freq: Once | INTRAMUSCULAR | Status: AC
  Administered 2016-04-16: 1 mg via INTRAVENOUS

## 2016-04-16 MED ORDER — LORAZEPAM 2 MG/ML IJ SOLN
INTRAMUSCULAR | Status: AC
  Administered 2016-04-16: 1 mg via INTRAVENOUS
  Filled 2016-04-16: qty 1

## 2016-04-16 MED ORDER — TRAMADOL HCL 50 MG PO TABS: 50.0000 mg | ORAL_TABLET | Freq: Four times a day (QID) | ORAL | 0 refills | Status: AC | PRN

## 2016-04-16 MED ORDER — TRAMADOL HCL 50 MG PO TABS
50.0000 mg | ORAL_TABLET | Freq: Once | ORAL | Status: AC
  Administered 2016-04-16: 50 mg via ORAL
  Filled 2016-04-16 (×2): qty 1

## 2016-04-16 NOTE — ED Notes (Signed)
Pt taken to car by this nurse and family. Pt was in a wheelchair and was in NAD at time of discharge. Pt able to ambulate with one assist and was able to get self in car with little assistance by this RN.

## 2016-04-16 NOTE — ED Provider Notes (Addendum)
[facility] Emergency Department Provider Note  ____________________________________________   I have reviewed the triage vital signs and the nursing notes.   HISTORY  Chief Complaint Knee Pain    HPI [name] is a 82 y.o. female who presents today complaining of knee pain. She fell 2 days ago. Non-syncopal fall. Did not hit her head. No other injury. She has been having difficulty walking on the left and the pain seems to be increasing. No fever. No numbness no weakness no calf pain no shortness of breath. She has been able to walk with some difficulty. Has no hip pain or ankle pain.  Past Medical History:  Diagnosis Date  . Anxiety   . Arthritis    hands  . Cancer ([place])   . CHF (congestive heart failure) (HCC)    swelling of feet and legs  . COPD (chronic obstructive pulmonary disease) (HCC)    O2 use at night 2 liters  . DDD (degenerative disc disease), cervical    cervical laminectomy x 2  . Deaf, right   . Depression   . Diabetes mellitus without complication ([place])   . GERD (gastroesophageal reflux disease)   . High cholesterol   . History of hiatal hernia   . Hypertension    controlled on meds  . Neuromuscular disorder (HCC)    neuropathy  . PONV (postoperative nausea and vomiting)   . Shortness of breath dyspnea   . Vertigo    last episode approx 1 yr ago  . Wears dentures    full upper and lower    There are no active problems to display for this patient.   Past Surgical History:  Procedure Laterality Date  . ABDOMINAL HYSTERECTOMY  1963  . APPENDECTOMY    . BACK SURGERY     cervical laminectomy x2  . CATARACT EXTRACTION W/PHACO Right 03/10/2016   Procedure: CATARACT EXTRACTION PHACO AND INTRAOCULAR LENS PLACEMENT (IOC);  Surgeon: Leandrew Koyanagi, MD;  Location: [place];  Service: Ophthalmology;  Laterality: Right;  DIABETIC - oral meds  . CATARACT EXTRACTION W/PHACO Left 04/07/2016   Procedure: CATARACT  EXTRACTION PHACO AND INTRAOCULAR LENS PLACEMENT (IOC);  Surgeon: Leandrew Koyanagi, MD;  Location: [place];  Service: Ophthalmology;  Laterality: Left;  DIABETIC, oral med [place]    . ESOPHAGOGASTRODUODENOSCOPY N/A 07/18/2015   Procedure: ESOPHAGOGASTRODUODENOSCOPY (EGD);  Surgeon: Hulen Luster, MD;  Location[facility] ENDOSCOPY;  Service: Gastroenterology;  Laterality: N/A;  . JOINT REPLACEMENT     hip / also revision  . SHOULDER SURGERY      Prior to Admission medications   Medication Sig Start Date End Date Taking? Authorizing Provider  aspirin EC 81 MG tablet Take 81 mg by mouth daily. breakfast    Historical Provider, MD  cyanocobalamin 1000 MCG tablet Take 1,000 mcg by mouth daily. breakfast    Historical Provider, MD  DULoxetine (CYMBALTA) 20 MG capsule Take 20 mg by mouth daily. breakfast    Historical Provider, MD  furosemide (LASIX) 20 MG tablet Take 20 mg by mouth daily. 1 to 2 daily breakfast    Historical Provider, MD  gabapentin (NEURONTIN) 400 MG capsule Take 1 capsule (400 mg total) by mouth at bedtime. Patient not taking: Reported on 04/01/2016 03/15/13   Tamala Fothergill Regal, DPM  glipiZIDE (GLUCOTROL XL) 2.5 MG 24 hr tablet Take 2.5 mg by mouth daily with breakfast.    Historical Provider, MD  glucose blood test strip 1 each by  Other route as needed for other. Use as instructed    Historical Provider, MD  metoprolol (LOPRESSOR) 50 MG tablet Take 25 mg by mouth daily. breakfast    Historical Provider, MD  ondansetron (ZOFRAN ODT) 8 MG disintegrating tablet Take 1 tablet (8 mg total) by mouth every 8 (eight) hours as needed for nausea or vomiting. Patient not taking: Reported on 04/01/2016 07/07/15   Carrie Mew, MD  OXYGEN Inhale 2 L into the lungs as needed (at night and as needed).    Historical Provider, MD  pantoprazole (PROTONIX) 40 MG tablet Take 40 mg by mouth 2 (two) times daily before a meal.    Historical Provider, MD  simvastatin (ZOCOR) 20  MG tablet Take 20 mg by mouth daily. breakfast    Historical Provider, MD  solifenacin (VESICARE) 10 MG tablet Take 10 mg by mouth daily. breakfast    Historical Provider, MD  traMADol (ULTRAM) 50 MG tablet Take 1 tablet (50 mg total) by mouth every 6 (six) hours as needed. 04/16/16 04/16/17  Schuyler Amor, MD    Allergies Ciprofloxacin; Sertraline; and Codeine  History reviewed. No pertinent family history.  Social History Social History  Substance Use Topics  . Smoking status: Former Smoker    Packs/day: 0.25    Years: 20.00    Types: Cigarettes    Quit date: 03/14/2000  . Smokeless tobacco: Never Used  . Alcohol use No    Review of Systems Constitutional: No fever/chills Eyes: No visual changes. ENT: No sore throat. No stiff neck no neck pain Cardiovascular: Denies chest pain. Respiratory: Denies shortness of breath. Gastrointestinal:   no vomiting.  No diarrhea.  No constipation. Genitourinary: Negative for dysuria. Musculoskeletal: Negative lower extremity swelling Skin: Negative for rash. Neurological: Negative for severe headaches, focal weakness or numbness. 10-point ROS otherwise negative.  ____________________________________________   PHYSICAL EXAM:  VITAL SIGNS: ED Triage Vitals  Enc Vitals Group     BP 04/16/16 0916 (!) 150/72     Pulse Rate 04/16/16 0916 86     Resp 04/16/16 0916 20     Temp 04/16/16 0916 98.2 F (36.8 C)     Temp Source 04/16/16 0916 Oral     SpO2 04/16/16 0916 91 %     Weight 04/16/16 0920 180 lb (81.6 kg)     Height 04/16/16 0920 5' 3.5" (1.613 m)     Head Circumference --      Peak Flow --      Pain Score 04/16/16 0920 9     Pain Loc --      Pain Edu? --      Excl. in [place]? --     Constitutional: Alert and oriented. Well appearing and in no acute distress. Eyes: Conjunctivae are normal. PERRL. EOMI. Head: Atraumatic. Nose: No congestion/rhinnorhea. Mouth/Throat: Mucous membranes are moist.  Oropharynx  non-erythematous. Neck: No stridor.   Nontender with no meningismus Cardiovascular: Normal rate, regular rhythm. Grossly normal heart sounds.  Good peripheral circulation. Respiratory: Normal respiratory effort.  No retractions. Lungs CTAB. Abdominal: Soft and nontender. No distention. No guarding no rebound Back:  There is no focal tenderness or step off.  there is no midline tenderness there are no lesions noted. there is no CVA tenderness Musculoskeletal: Tennis palpation in the right knee with a middle effusion noted. Not red or hot to touch. Patient has pain with straight leg raise and with bending of the knee., no upper extremity tenderness.  no DVT signs strong distal pulses  no edema Neurologic:  Normal speech and language. No gross focal neurologic deficits are appreciated.  Skin:  Skin is warm, dry and intact. No rash noted. Psychiatric: Mood and affect are normal. Speech and behavior are normal.  ____________________________________________   LABS (all labs ordered are listed, but only abnormal results are displayed)  Labs Reviewed - No data to display ____________________________________________  EKG  I personally interpreted any EKGs ordered by me or triage  ____________________________________________  RADIOLOGY  I reviewed any imaging ordered by me or triage that were performed during my shift and, if possible, patient and/or family made aware of any abnormal findings. ____________________________________________   PROCEDURES  Procedure(s) performed: None  Procedures  Critical Care performed: None  ____________________________________________   INITIAL IMPRESSION / ASSESSMENT AND PLAN / ED COURSE  Pertinent labs & imaging results that were available during my care of the patient were reviewed by me and considered in my medical decision making (see chart for details).  Patient with acute injury to the knee there is no hip or ankle pain there are strong  pulses soft compartments and no evidence of DVT or joint infection. I discussed with on-call orthopedic surgery, Dr. Mack Guise, the findings of x-ray did request an MRI for outpatient follow-up. Even that we'll be very difficult for patient to get an outpatient MRI, even though it would not change our disposition is going home with a knee immobilizer according to orthopedics, we did obtain appear there is no evidence of a complete tear of the extensor mechanism for quadriceps tendon, fortunately, there is some evidence of meniscal injury. We will send her home with pain medication which she tolerates very well she states knee immobilizer and she is to ambulate using her walker. She does have a walker at home but has not been using it. Orthopedic surgery follow-up given and understood. No other evidence of injury after this fall several days ago. Return precautions understood.  ----------------------------------------- 3:44 PM on 04/16/2016 -----------------------------------------  Walked with no problem  Clinical Course   ____________________________________________   FINAL CLINICAL IMPRESSION(S) / ED DIAGNOSES  Final diagnoses:  Knee pain  Tear of meniscus of right knee as current injury, unspecified meniscus, unspecified tear type, initial encounter      This chart was dictated using voice recognition software.  Despite best efforts to proofread,  errors can occur which can change meaning.      Schuyler Amor, MD 04/16/16 1541    Schuyler Amor, MD 04/16/16 [id number]

## 2016-04-16 NOTE — ED Notes (Signed)
Patient transported to MRI 

## 2016-04-16 NOTE — ED Triage Notes (Signed)
Patient arrived to [facility] ED via [facility] with c/o right knee pain. Patient states that she slid off of her chair onto her knee 3 days ago. Patient has been ambulatory since then, however patient fell again this morning due to "my knee giving out". Patient states that she is unable to ambulate now due to pain and swelling.  Swelling noted to right knee.

## 2016-04-16 NOTE — ED Notes (Signed)
Pt. Walked with knee immobilizer on with Amber Williard device with minimal assistance. Pt states that the knee immobilizer helps with the pain.

## 2016-05-19 ENCOUNTER — Ambulatory Visit
Admission: RE | Admit: 2016-05-19 | Discharge: 2016-05-19 | Disposition: A | Discharge status: Home / Self Care | Payer: Medicare Other | Source: Ambulatory Visit | Attending: Orthopedic Surgery | Admitting: Orthopedic Surgery

## 2016-05-19 ENCOUNTER — Other Ambulatory Visit: Payer: Self-pay | Admitting: Orthopedic Surgery

## 2016-05-19 DIAGNOSIS — M79604 Pain in right leg: Secondary | ICD-10-CM | POA: Diagnosis not present

## 2016-06-23 ENCOUNTER — Other Ambulatory Visit: Payer: Self-pay | Admitting: Physician Assistant

## 2016-06-23 ENCOUNTER — Ambulatory Visit
Admission: RE | Admit: 2016-06-23 | Discharge: 2016-06-23 | Disposition: A | Discharge status: Home / Self Care | Payer: Medicare Other | Source: Ambulatory Visit | Attending: Physician Assistant | Admitting: Physician Assistant

## 2016-06-23 DIAGNOSIS — R600 Localized edema: Secondary | ICD-10-CM

## 2016-08-08 ENCOUNTER — Emergency Department: Payer: Medicare Other

## 2016-08-08 ENCOUNTER — Emergency Department
Admission: EM | Admit: 2016-08-08 | Discharge: 2016-08-08 | Disposition: A | Discharge status: Home / Self Care | Payer: Medicare Other | Attending: Emergency Medicine | Admitting: Emergency Medicine

## 2016-08-08 DIAGNOSIS — I509 Heart failure, unspecified: Secondary | ICD-10-CM | POA: Diagnosis not present

## 2016-08-08 DIAGNOSIS — Z7984 Long term (current) use of oral hypoglycemic drugs: Secondary | ICD-10-CM | POA: Insufficient documentation

## 2016-08-08 DIAGNOSIS — Z79899 Other long term (current) drug therapy: Secondary | ICD-10-CM | POA: Diagnosis not present

## 2016-08-08 DIAGNOSIS — I110 Hypertensive heart disease with heart failure: Secondary | ICD-10-CM | POA: Diagnosis not present

## 2016-08-08 DIAGNOSIS — E119 Type 2 diabetes mellitus without complications: Secondary | ICD-10-CM | POA: Insufficient documentation

## 2016-08-08 DIAGNOSIS — Z7982 Long term (current) use of aspirin: Secondary | ICD-10-CM | POA: Diagnosis not present

## 2016-08-08 DIAGNOSIS — I160 Hypertensive urgency: Secondary | ICD-10-CM

## 2016-08-08 DIAGNOSIS — R51 Headache: Secondary | ICD-10-CM | POA: Diagnosis present

## 2016-08-08 DIAGNOSIS — J449 Chronic obstructive pulmonary disease, unspecified: Secondary | ICD-10-CM | POA: Diagnosis not present

## 2016-08-08 DIAGNOSIS — Z87891 Personal history of nicotine dependence: Secondary | ICD-10-CM | POA: Diagnosis not present

## 2016-08-08 LAB — BASIC METABOLIC PANEL
Anion gap: 9 (ref 5–15)
BUN: 21 mg/dL — AB (ref 6–20)
CHLORIDE: 101 mmol/L (ref 101–111)
CO2: 28 mmol/L (ref 22–32)
CREATININE: 0.97 mg/dL (ref 0.44–1.00)
Calcium: 9.5 mg/dL (ref 8.9–10.3)
GFR calc Af Amer: >60 mL/min (ref >60)
GFR calc non Af Amer: 53 mL/min — ABNORMAL LOW (ref >60)
GLUCOSE: 116 mg/dL — AB (ref 65–99)
POTASSIUM: 4.3 mmol/L (ref 3.5–5.1)
SODIUM: 138 mmol/L (ref 135–145)

## 2016-08-08 LAB — TROPONIN I: Troponin I: <0.03 ng/mL (ref <0.03)

## 2016-08-08 LAB — CBC
HEMATOCRIT: 43.4 % (ref 35.0–47.0)
Hemoglobin: 14 g/dL (ref 12.0–16.0)
MCH: 27 pg (ref 26.0–34.0)
MCHC: 32.3 g/dL (ref 32.0–36.0)
MCV: 83.7 fL (ref 80.0–100.0)
Platelets: 199 10*3/uL (ref 150–440)
RBC: 5.19 MIL/uL (ref 3.80–5.20)
RDW: 15.5 % — AB (ref 11.5–14.5)
WBC: 11 10*3/uL (ref 3.6–11.0)

## 2016-08-08 MED ORDER — TRAMADOL HCL 50 MG PO TABS
50.0000 mg | ORAL_TABLET | ORAL | Status: AC
  Administered 2016-08-08: 50 mg via ORAL
  Filled 2016-08-08: qty 1

## 2016-08-08 MED ORDER — LISINOPRIL 5 MG PO TABS
5.0000 mg | ORAL_TABLET | Freq: Once | ORAL | Status: AC
  Administered 2016-08-08: 5 mg via ORAL
  Filled 2016-08-08: qty 1

## 2016-08-08 MED ORDER — LISINOPRIL 5 MG PO TABS: 5.0000 mg | ORAL_TABLET | Freq: Every day | ORAL | 0 refills | Status: AC

## 2016-08-08 NOTE — ED Notes (Signed)
Pt. Going home wih family.

## 2016-08-08 NOTE — ED Triage Notes (Signed)
Reports having headaches off/on for the past week.  Pain worse today.  Reports blood pressure at home was 223/110.

## 2016-08-08 NOTE — ED Provider Notes (Signed)
[facility] Emergency Department Provider Note   ____________________________________________   First MD Initiated Contact with Patient 08/08/16 1919     (approximate)  I have reviewed the triage vital signs and the nursing notes.   HISTORY  Chief Complaint Headache  HPI [name] is a 83 y.o. female here for evaluation of headache and high blood pressure  Patient reports for the past week she's been having off and on headache which she describes as throbbing and in the front around her forehead. It comes and goes, but she noticed the last few days when she is having a headache her blood pressure is been high. She had a blood pressure of 223/110 on her home monitor today while experiencing a moderate throbbing headache. No nausea or vomiting. No fevers or chills. No neck pain or stiffness. No trouble speaking numbness weakness tingling or other concerns.  She does take metoprolol and has been on the same dose for 20 very long time in: No new medication changes.  No chest pain or shortness of breath.No leg swelling.   Past Medical History:  Diagnosis Date  . Anxiety   . Arthritis    hands  . Cancer ([place])   . CHF (congestive heart failure) (HCC)    swelling of feet and legs  . COPD (chronic obstructive pulmonary disease) (HCC)    O2 use at night 2 liters  . DDD (degenerative disc disease), cervical    cervical laminectomy x 2  . Deaf, right   . Depression   . Diabetes mellitus without complication ([place])   . GERD (gastroesophageal reflux disease)   . High cholesterol   . History of hiatal hernia   . Hypertension    controlled on meds  . Neuromuscular disorder (HCC)    neuropathy  . PONV (postoperative nausea and vomiting)   . Shortness of breath dyspnea   . Vertigo    last episode approx 1 yr ago  . Wears dentures    full upper and lower    There are no active problems to display for this patient.   Past Surgical History:    Procedure Laterality Date  . ABDOMINAL HYSTERECTOMY  1963  . APPENDECTOMY    . BACK SURGERY     cervical laminectomy x2  . CATARACT EXTRACTION W/PHACO Right 03/10/2016   Procedure: CATARACT EXTRACTION PHACO AND INTRAOCULAR LENS PLACEMENT (IOC);  Surgeon: Leandrew Koyanagi, MD;  Location: [facility];  Service: Ophthalmology;  Laterality: Right;  DIABETIC - oral meds  . CATARACT EXTRACTION W/PHACO Left 04/07/2016   Procedure: CATARACT EXTRACTION PHACO AND INTRAOCULAR LENS PLACEMENT (IOC);  Surgeon: Leandrew Koyanagi, MD;  Location: [place];  Service: Ophthalmology;  Laterality: Left;  DIABETIC, oral med [place]    . ESOPHAGOGASTRODUODENOSCOPY N/A 07/18/2015   Procedure: ESOPHAGOGASTRODUODENOSCOPY (EGD);  Surgeon: Hulen Luster, MD;  Location: [facility] ENDOSCOPY;  Service: Gastroenterology;  Laterality: N/A;  . JOINT REPLACEMENT     hip / also revision  . SHOULDER SURGERY      Prior to Admission medications   Medication Sig Start Date End Date Taking? Authorizing Provider  aspirin EC 81 MG tablet Take 81 mg by mouth daily. breakfast    Historical Provider, MD  cyanocobalamin 1000 MCG tablet Take 1,000 mcg by mouth daily. breakfast    Historical Provider, MD  DULoxetine (CYMBALTA) 20 MG capsule Take 20 mg by mouth daily. breakfast    Historical Provider, MD  furosemide (LASIX) 20 MG  tablet Take 20 mg by mouth daily. 1 to 2 daily breakfast    Historical Provider, MD  gabapentin (NEURONTIN) 400 MG capsule Take 1 capsule (400 mg total) by mouth at bedtime. Patient not taking: Reported on 04/01/2016 03/15/13   Tamala Fothergill Regal, DPM  glipiZIDE (GLUCOTROL XL) 2.5 MG 24 hr tablet Take 2.5 mg by mouth daily with breakfast.    Historical Provider, MD  glucose blood test strip 1 each by Other route as needed for other. Use as instructed    Historical Provider, MD  lisinopril (PRINIVIL,ZESTRIL) 5 MG tablet Take 1 tablet (5 mg total) by mouth daily. 08/08/16 08/08/17  Delman Kitten, MD  metoprolol (LOPRESSOR) 50 MG tablet Take 25 mg by mouth daily. breakfast    Historical Provider, MD  ondansetron (ZOFRAN ODT) 8 MG disintegrating tablet Take 1 tablet (8 mg total) by mouth every 8 (eight) hours as needed for nausea or vomiting. Patient not taking: Reported on 04/01/2016 07/07/15   Carrie Mew, MD  OXYGEN Inhale 2 L into the lungs as needed (at night and as needed).    Historical Provider, MD  pantoprazole (PROTONIX) 40 MG tablet Take 40 mg by mouth 2 (two) times daily before a meal.    Historical Provider, MD  simvastatin (ZOCOR) 20 MG tablet Take 20 mg by mouth daily. breakfast    Historical Provider, MD  solifenacin (VESICARE) 10 MG tablet Take 10 mg by mouth daily. breakfast    Historical Provider, MD  traMADol (ULTRAM) 50 MG tablet Take 1 tablet (50 mg total) by mouth every 6 (six) hours as needed. 04/16/16 04/16/17  Schuyler Amor, MD    Allergies Ciprofloxacin; Sertraline; and Codeine  No family history on file.  Social History Social History  Substance Use Topics  . Smoking status: Former Smoker    Packs/day: 0.25    Years: 20.00    Types: Cigarettes    Quit date: 03/14/2000  . Smokeless tobacco: Never Used  . Alcohol use No    Review of Systems Constitutional: No fever/chills Eyes: No visual changes. ENT: No sore throat. Cardiovascular: Denies chest pain. Respiratory: Denies shortness of breath. Gastrointestinal: No abdominal pain.  No nausea, no vomiting.  Genitourinary: Negative for dysuria. Musculoskeletal: Negative for back pain. Skin: Negative for rash. Neurological: Negative for focal weakness or numbness.  10-point ROS otherwise negative.  ____________________________________________   PHYSICAL EXAM:  VITAL SIGNS: ED Triage Vitals  Enc Vitals Group     BP 08/08/16 1903 (!) 195/78     Pulse Rate 08/08/16 1903 76     Resp 08/08/16 1903 20     Temp 08/08/16 1903 98.2 F (36.8 C)     Temp Source 08/08/16 1903 Oral      SpO2 08/08/16 1903 91 %     Weight 08/08/16 1905 178 lb (80.7 kg)     Height --      Head Circumference --      Peak Flow --      Pain Score --      Pain Loc --      Pain Edu? --      Excl. in [place]? --     Constitutional: Alert and oriented. Well appearing and in no acute distress.The present time she reports her headache is improved. Eyes: Conjunctivae are normal. PERRL. EOMI. patient denies any pain in her eyes Head: Atraumatic. No temporal artery tenderness. Normal temporal artery pulsations are noted bilaterally Nose: No congestion/rhinnorhea. Mouth/Throat: Mucous membranes are moist.  Oropharynx non-erythematous. Neck: No stridor.  No meningismus or rigidity Cardiovascular: Normal rate, regular rhythm. Grossly normal heart sounds.  Good peripheral circulation. Respiratory: Normal respiratory effort.  No retractions. Lungs CTAB. Gastrointestinal: Soft and nontender. No distention.  Musculoskeletal: No lower extremity tenderness nor edema.  No joint effusions. Neurologic:  Normal speech and language. No gross focal neurologic deficits are appreciated.  The patient has no pronator drift. The patient has normal cranial nerve exam. Extraocular movements are normal. Visual fields are normal. Patient has 5 out of 5 strength in all extremities. There is no numbness or gross, acute sensory abnormality in the extremities bilaterally. No speech disturbance. No dysarthria. No aphasia. No ataxia. Normal finger nose finger bilat. Patient speaking in full and clear sentences. Skin:  Skin is warm, dry and intact. No rash noted. Psychiatric: Mood and affect are normal. Speech and behavior are normal.  No clinical exam findings congestive heart failure noted. ____________________________________________   LABS (all labs ordered are listed, but only abnormal results are displayed)  Labs Reviewed  BASIC METABOLIC PANEL - Abnormal; Notable for the following:       Result Value   Glucose,  Bld 116 (*)    BUN 21 (*)    GFR calc non Af Amer 53 (*)    All other components within normal limits  CBC - Abnormal; Notable for the following:    RDW 15.5 (*)    All other components within normal limits  TROPONIN I   ____________________________________________  EKG  Reviewed and interpreted by me at 1958 Heart rate 70 QRS 80 QTc 440 Normal sinus rhythm, no evidence of acute ischemia. Compared with patient's previous EKG from August 2016 no significant changes found ____________________________________________  RADIOLOGY  Ct Head Wo Contrast  Result Date: 08/08/2016 CLINICAL DATA:  Intermittent headache for 1 week, worse today. Hypertension. EXAM: CT HEAD WITHOUT CONTRAST TECHNIQUE: Contiguous axial images were obtained from the base of the skull through the vertex without intravenous contrast. COMPARISON:  Head CT 10/17/2015 FINDINGS: Brain: No evidence of acute infarction, hemorrhage, hydrocephalus, extra-axial collection or mass lesion/mass effect. Mild generalized atrophy, stable from prior exam. Vascular: Atherosclerosis of skullbase vasculature without hyperdense vessel or abnormal calcification. Skull: Normal. Negative for fracture or focal lesion. Sinuses/Orbits: Paranasal sinuses and mastoid air cells are clear. Post bilateral cataract resection. Other: None. IMPRESSION: No acute intracranial abnormality.  Stable generalized atrophy. Electronically Signed   By: Jeb Levering M.D.   On: 08/08/2016 19:48    ____________________________________________   PROCEDURES  Procedure(s) performed: None  Procedures  Critical Care performed: No  ____________________________________________   INITIAL IMPRESSION / ASSESSMENT AND PLAN / ED COURSE  Pertinent labs & imaging results that were available during my care of the patient were reviewed by me and considered in my medical decision making (see chart for details).  Differential diagnosis includes but is not exclusive  to subarachnoid hemorrhage, meningitis, encephalitis, previous head trauma, cavernous venous thrombosis, muscle tension headache, temporal arteritis, migraine or migraine equivalent, etc. clinical examination and history do not appear consistent with subarachnoid hemorrhage, acute stroke, meningitis or other acute infectious etiology, temporal arteritis, or other obvious concerning cause for headache at this time. CT scan demonstrates no acute finding. She has no infectious symptoms. No tenderness over the temporal regions, no evidence of or symptomatology suggest glaucoma.  I suspect, given her reassuring labs that she is likely suffering from hypertensive urgency as she associates these headaches with elevated blood pressures at home as well. She takes  metoprolol, her heart rate is noted to run in the low 60s at times and don't think increase in dose of metoprolol is reasonable at this time, but discussed the risks and benefits including those of angioedema while taking lisinopril with the patient and this will place her on that medication for blood pressure lowering.  ----------------------------------------- 8:46 PM on 08/08/2016 -----------------------------------------  Ongoing plan care assigned to Dr. Mable Paris: Patient with hyper intensive urgency/headache adding lisinopril. Monitor for improvement in blood pressure after lisinopril administration. If improvement in blood pressure and headache improvement, and a discharged home with a short course of lisinopril prescription prescription and recommendation to follow-up with her primary care doctor for reevaluation this week.       ____________________________________________   FINAL CLINICAL IMPRESSION(S) / ED DIAGNOSES  Final diagnoses:  Hypertensive urgency      NEW MEDICATIONS STARTED DURING THIS VISIT:  New Prescriptions   LISINOPRIL (PRINIVIL,ZESTRIL) 5 MG TABLET    Take 1 tablet (5 mg total) by mouth daily.     Note:   This document was prepared using Dragon voice recognition software and may include unintentional dictation errors.     Delman Kitten, MD 08/08/16 2049

## 2016-08-08 NOTE — ED Notes (Signed)
Pt. And pt. Family report systolic blood pressure over 200 this morning.  Pt states she took blood pressure medication today and fluid pill.  Pt. And pt family report improving during afternoon.   Pt. States this evening blood pressure increased again with headache.

## 2016-08-08 NOTE — Discharge Instructions (Signed)
You have been seen in the Emergency Department (ED) for a headache and elevated blood pressure.    As we have discussed, please follow up with your primary care doctor as soon as possible regarding today?s Emergency Department (ED) visit and your headache symptoms.    Call your doctor or return to the ED if you have a worsening headache, sudden and severe headache, confusion, slurred speech, facial droop, weakness or numbness in any arm or leg, extreme fatigue, vision problems, or other symptoms that concern you.

## 2016-08-10 ENCOUNTER — Other Ambulatory Visit: Payer: Self-pay | Admitting: Gastroenterology

## 2016-08-10 DIAGNOSIS — R1310 Dysphagia, unspecified: Secondary | ICD-10-CM

## 2016-08-20 ENCOUNTER — Ambulatory Visit: Admission: RE | Admit: 2016-08-20 | Payer: Medicare Other | Source: Ambulatory Visit

## 2017-02-10 ENCOUNTER — Other Ambulatory Visit: Payer: Self-pay | Admitting: Internal Medicine

## 2017-02-10 DIAGNOSIS — R519 Headache, unspecified: Secondary | ICD-10-CM

## 2017-02-10 DIAGNOSIS — R51 Headache: Principal | ICD-10-CM

## 2017-02-13 ENCOUNTER — Inpatient Hospital Stay
Admission: EM | Admit: 2017-02-13 | Discharge: 2017-02-17 | DRG: 392 | Disposition: A | Discharge status: Home / Self Care | Payer: Medicare Other | Attending: Internal Medicine | Admitting: Internal Medicine

## 2017-02-13 ENCOUNTER — Encounter: Payer: Self-pay | Admitting: Emergency Medicine

## 2017-02-13 ENCOUNTER — Observation Stay: Payer: Medicare Other

## 2017-02-13 DIAGNOSIS — R112 Nausea with vomiting, unspecified: Secondary | ICD-10-CM

## 2017-02-13 DIAGNOSIS — M19049 Primary osteoarthritis, unspecified hand: Secondary | ICD-10-CM | POA: Diagnosis present

## 2017-02-13 DIAGNOSIS — Z888 Allergy status to other drugs, medicaments and biological substances status: Secondary | ICD-10-CM

## 2017-02-13 DIAGNOSIS — E7800 Pure hypercholesterolemia, unspecified: Secondary | ICD-10-CM | POA: Diagnosis present

## 2017-02-13 DIAGNOSIS — Z859 Personal history of malignant neoplasm, unspecified: Secondary | ICD-10-CM

## 2017-02-13 DIAGNOSIS — K228 Other specified diseases of esophagus: Secondary | ICD-10-CM | POA: Diagnosis present

## 2017-02-13 DIAGNOSIS — H9191 Unspecified hearing loss, right ear: Secondary | ICD-10-CM | POA: Diagnosis present

## 2017-02-13 DIAGNOSIS — R1310 Dysphagia, unspecified: Secondary | ICD-10-CM | POA: Diagnosis present

## 2017-02-13 DIAGNOSIS — Z87891 Personal history of nicotine dependence: Secondary | ICD-10-CM

## 2017-02-13 DIAGNOSIS — Z9049 Acquired absence of other specified parts of digestive tract: Secondary | ICD-10-CM

## 2017-02-13 DIAGNOSIS — Z794 Long term (current) use of insulin: Secondary | ICD-10-CM

## 2017-02-13 DIAGNOSIS — I10 Essential (primary) hypertension: Secondary | ICD-10-CM | POA: Diagnosis present

## 2017-02-13 DIAGNOSIS — K2900 Acute gastritis without bleeding: Secondary | ICD-10-CM | POA: Diagnosis not present

## 2017-02-13 DIAGNOSIS — K219 Gastro-esophageal reflux disease without esophagitis: Secondary | ICD-10-CM | POA: Diagnosis present

## 2017-02-13 DIAGNOSIS — Z96649 Presence of unspecified artificial hip joint: Secondary | ICD-10-CM | POA: Diagnosis present

## 2017-02-13 DIAGNOSIS — Z72820 Sleep deprivation: Secondary | ICD-10-CM

## 2017-02-13 DIAGNOSIS — Z881 Allergy status to other antibiotic agents status: Secondary | ICD-10-CM

## 2017-02-13 DIAGNOSIS — I5032 Chronic diastolic (congestive) heart failure: Secondary | ICD-10-CM | POA: Diagnosis present

## 2017-02-13 DIAGNOSIS — Z7982 Long term (current) use of aspirin: Secondary | ICD-10-CM

## 2017-02-13 DIAGNOSIS — Z9981 Dependence on supplemental oxygen: Secondary | ICD-10-CM

## 2017-02-13 DIAGNOSIS — G709 Myoneural disorder, unspecified: Secondary | ICD-10-CM | POA: Diagnosis present

## 2017-02-13 DIAGNOSIS — Z7984 Long term (current) use of oral hypoglycemic drugs: Secondary | ICD-10-CM

## 2017-02-13 DIAGNOSIS — Z79899 Other long term (current) drug therapy: Secondary | ICD-10-CM

## 2017-02-13 DIAGNOSIS — I701 Atherosclerosis of renal artery: Secondary | ICD-10-CM | POA: Diagnosis present

## 2017-02-13 DIAGNOSIS — Z885 Allergy status to narcotic agent status: Secondary | ICD-10-CM

## 2017-02-13 DIAGNOSIS — F329 Major depressive disorder, single episode, unspecified: Secondary | ICD-10-CM | POA: Diagnosis present

## 2017-02-13 DIAGNOSIS — I110 Hypertensive heart disease with heart failure: Secondary | ICD-10-CM | POA: Diagnosis present

## 2017-02-13 DIAGNOSIS — M5030 Other cervical disc degeneration, unspecified cervical region: Secondary | ICD-10-CM | POA: Diagnosis present

## 2017-02-13 DIAGNOSIS — F419 Anxiety disorder, unspecified: Secondary | ICD-10-CM | POA: Diagnosis present

## 2017-02-13 DIAGNOSIS — J449 Chronic obstructive pulmonary disease, unspecified: Secondary | ICD-10-CM | POA: Diagnosis present

## 2017-02-13 DIAGNOSIS — E1142 Type 2 diabetes mellitus with diabetic polyneuropathy: Secondary | ICD-10-CM | POA: Diagnosis present

## 2017-02-13 LAB — COMPREHENSIVE METABOLIC PANEL
ALT: 24 U/L (ref 14–54)
ANION GAP: 7 (ref 5–15)
AST: 23 U/L (ref 15–41)
Albumin: 4 g/dL (ref 3.5–5.0)
Alkaline Phosphatase: 82 U/L (ref 38–126)
BUN: 17 mg/dL (ref 6–20)
CALCIUM: 9.6 mg/dL (ref 8.9–10.3)
CHLORIDE: 102 mmol/L (ref 101–111)
CO2: 28 mmol/L (ref 22–32)
CREATININE: 0.99 mg/dL (ref 0.44–1.00)
GFR calc Af Amer: 60 mL/min — ABNORMAL LOW (ref >60)
GFR, EST NON AFRICAN AMERICAN: 52 mL/min — AB (ref >60)
Glucose, Bld: 133 mg/dL — ABNORMAL HIGH (ref 65–99)
Potassium: 4.1 mmol/L (ref 3.5–5.1)
SODIUM: 137 mmol/L (ref 135–145)
Total Bilirubin: 0.8 mg/dL (ref 0.3–1.2)
Total Protein: 7.2 g/dL (ref 6.5–8.1)

## 2017-02-13 LAB — CBC
HCT: 46.9 % (ref 35.0–47.0)
Hemoglobin: 15.3 g/dL (ref 12.0–16.0)
MCH: 28.3 pg (ref 26.0–34.0)
MCHC: 32.5 g/dL (ref 32.0–36.0)
MCV: 86.9 fL (ref 80.0–100.0)
PLATELETS: 237 10*3/uL (ref 150–440)
RBC: 5.4 MIL/uL — ABNORMAL HIGH (ref 3.80–5.20)
RDW: 15.7 % — AB (ref 11.5–14.5)
WBC: 13 10*3/uL — AB (ref 3.6–11.0)

## 2017-02-13 LAB — GLUCOSE, CAPILLARY
GLUCOSE-CAPILLARY: 108 mg/dL — AB (ref 65–99)
GLUCOSE-CAPILLARY: 124 mg/dL — AB (ref 65–99)

## 2017-02-13 LAB — URINALYSIS, COMPLETE (UACMP) WITH MICROSCOPIC
Bacteria, UA: NONE SEEN
Bilirubin Urine: NEGATIVE
GLUCOSE, UA: NEGATIVE mg/dL
HGB URINE DIPSTICK: NEGATIVE
Ketones, ur: NEGATIVE mg/dL
Leukocytes, UA: NEGATIVE
Nitrite: NEGATIVE
PH: 7 (ref 5.0–8.0)
Protein, ur: NEGATIVE mg/dL
SPECIFIC GRAVITY, URINE: 1.008 (ref 1.005–1.030)
SQUAMOUS EPITHELIAL / LPF: NONE SEEN

## 2017-02-13 LAB — LIPASE, BLOOD: LIPASE: 18 U/L (ref 11–51)

## 2017-02-13 MED ORDER — METOPROLOL TARTRATE 25 MG PO TABS
25.0000 mg | ORAL_TABLET | Freq: Every day | ORAL | Status: DC
  Administered 2017-02-13 – 2017-02-17 (×5): 25 mg via ORAL
  Filled 2017-02-13 (×5): qty 1

## 2017-02-13 MED ORDER — DARIFENACIN HYDROBROMIDE ER 7.5 MG PO TB24
7.5000 mg | ORAL_TABLET | Freq: Every day | ORAL | Status: DC
  Administered 2017-02-13 – 2017-02-17 (×5): 7.5 mg via ORAL
  Filled 2017-02-13 (×6): qty 1

## 2017-02-13 MED ORDER — ALUM & MAG HYDROXIDE-SIMETH 200-200-20 MG/5ML PO SUSP
30.0000 mL | ORAL | Status: DC | PRN
Start: 2017-02-13 — End: 2017-02-17
  Administered 2017-02-13: 30 mL via ORAL
  Filled 2017-02-13: qty 30

## 2017-02-13 MED ORDER — ONDANSETRON HCL 4 MG PO TABS
4.0000 mg | ORAL_TABLET | Freq: Four times a day (QID) | ORAL | Status: DC | PRN
  Administered 2017-02-16: 4 mg via ORAL
  Filled 2017-02-13: qty 1

## 2017-02-13 MED ORDER — METOPROLOL TARTRATE 5 MG/5ML IV SOLN: 5.0000 mg | Freq: Four times a day (QID) | INTRAVENOUS | Status: DC

## 2017-02-13 MED ORDER — POLYETHYLENE GLYCOL 3350 17 G PO PACK: 17.0000 g | PACK | Freq: Every day | ORAL | Status: DC | PRN

## 2017-02-13 MED ORDER — ACETAMINOPHEN 650 MG RE SUPP: 650.0000 mg | Freq: Four times a day (QID) | RECTAL | Status: DC | PRN

## 2017-02-13 MED ORDER — SODIUM CHLORIDE 0.9 % IV SOLN
INTRAVENOUS | Status: DC
  Administered 2017-02-13 – 2017-02-14 (×2): via INTRAVENOUS

## 2017-02-13 MED ORDER — LISINOPRIL 5 MG PO TABS
5.0000 mg | ORAL_TABLET | Freq: Every day | ORAL | Status: DC
  Administered 2017-02-13 – 2017-02-15 (×3): 5 mg via ORAL
  Filled 2017-02-13 (×3): qty 1

## 2017-02-13 MED ORDER — ONDANSETRON 4 MG PO TBDP
4.0000 mg | ORAL_TABLET | Freq: Once | ORAL | Status: AC | PRN
  Administered 2017-02-13: 4 mg via ORAL
  Filled 2017-02-13: qty 1

## 2017-02-13 MED ORDER — SODIUM CHLORIDE 0.9 % IV SOLN
1000.0000 mL | Freq: Once | INTRAVENOUS | Status: AC
  Administered 2017-02-13: 1000 mL via INTRAVENOUS

## 2017-02-13 MED ORDER — INSULIN ASPART 100 UNIT/ML ~~LOC~~ SOLN
0.0000 [IU] | Freq: Three times a day (TID) | SUBCUTANEOUS | Status: DC
  Administered 2017-02-16: 1 [IU] via SUBCUTANEOUS
  Filled 2017-02-13: qty 1

## 2017-02-13 MED ORDER — METOCLOPRAMIDE HCL 5 MG/ML IJ SOLN
10.0000 mg | Freq: Two times a day (BID) | INTRAMUSCULAR | Status: DC | PRN
  Administered 2017-02-13 – 2017-02-15 (×2): 10 mg via INTRAVENOUS
  Filled 2017-02-13 (×2): qty 2

## 2017-02-13 MED ORDER — METOPROLOL TARTRATE 5 MG/5ML IV SOLN
5.0000 mg | Freq: Four times a day (QID) | INTRAVENOUS | Status: DC | PRN
  Administered 2017-02-16: 5 mg via INTRAVENOUS
  Filled 2017-02-13 (×2): qty 5

## 2017-02-13 MED ORDER — INSULIN ASPART 100 UNIT/ML ~~LOC~~ SOLN: 0.0000 [IU] | Freq: Every day | SUBCUTANEOUS | Status: DC

## 2017-02-13 MED ORDER — ASPIRIN EC 81 MG PO TBEC
81.0000 mg | DELAYED_RELEASE_TABLET | Freq: Every day | ORAL | Status: DC
  Administered 2017-02-13 – 2017-02-17 (×5): 81 mg via ORAL
  Filled 2017-02-13 (×5): qty 1

## 2017-02-13 MED ORDER — SIMVASTATIN 20 MG PO TABS
20.0000 mg | ORAL_TABLET | Freq: Every day | ORAL | Status: DC
  Administered 2017-02-13 – 2017-02-17 (×5): 20 mg via ORAL
  Filled 2017-02-13 (×5): qty 1

## 2017-02-13 MED ORDER — IOPAMIDOL (ISOVUE-370) INJECTION 76%
75.0000 mL | Freq: Once | INTRAVENOUS | Status: AC | PRN
  Administered 2017-02-13: 75 mL via INTRAVENOUS

## 2017-02-13 MED ORDER — PANTOPRAZOLE SODIUM 40 MG IV SOLR
40.0000 mg | Freq: Two times a day (BID) | INTRAVENOUS | Status: DC
  Administered 2017-02-13 – 2017-02-17 (×8): 40 mg via INTRAVENOUS
  Filled 2017-02-13 (×8): qty 40

## 2017-02-13 MED ORDER — ACETAMINOPHEN 325 MG PO TABS
650.0000 mg | ORAL_TABLET | Freq: Four times a day (QID) | ORAL | Status: DC | PRN
  Administered 2017-02-15: 21:00:00 650 mg via ORAL
  Filled 2017-02-13: qty 2

## 2017-02-13 MED ORDER — ENOXAPARIN SODIUM 40 MG/0.4ML ~~LOC~~ SOLN
40.0000 mg | SUBCUTANEOUS | Status: DC
  Administered 2017-02-13 – 2017-02-16 (×4): 40 mg via SUBCUTANEOUS
  Filled 2017-02-13 (×4): qty 0.4

## 2017-02-13 MED ORDER — ONDANSETRON HCL 4 MG/2ML IJ SOLN
4.0000 mg | Freq: Four times a day (QID) | INTRAMUSCULAR | Status: DC | PRN
  Administered 2017-02-13: 4 mg via INTRAVENOUS
  Filled 2017-02-13: qty 2

## 2017-02-13 MED ORDER — ONDANSETRON HCL 4 MG/2ML IJ SOLN
4.0000 mg | Freq: Once | INTRAMUSCULAR | Status: AC
  Administered 2017-02-13: 4 mg via INTRAVENOUS
  Filled 2017-02-13: qty 2

## 2017-02-13 MED ORDER — GABAPENTIN 400 MG PO CAPS
400.0000 mg | ORAL_CAPSULE | Freq: Every day | ORAL | Status: DC
  Administered 2017-02-13 – 2017-02-16 (×4): 400 mg via ORAL
  Filled 2017-02-13 (×5): qty 1

## 2017-02-13 MED ORDER — ONDANSETRON HCL 4 MG/2ML IJ SOLN
4.0000 mg | Freq: Once | INTRAMUSCULAR | Status: AC
  Administered 2017-02-13: 4 mg via INTRAVENOUS

## 2017-02-13 MED ORDER — TRAMADOL HCL 50 MG PO TABS
50.0000 mg | ORAL_TABLET | Freq: Four times a day (QID) | ORAL | Status: DC | PRN
  Administered 2017-02-16 (×2): 50 mg via ORAL
  Filled 2017-02-13 (×3): qty 1

## 2017-02-13 NOTE — ED Triage Notes (Signed)
Arrives with c/o vomiting x 1 day.  States unable to tolerate anything PO.

## 2017-02-13 NOTE — Progress Notes (Signed)
Patient noted to have persistent nausea with dry hives and kept on burping. Denies chest pain or any chest discomforts. Obtained order for reglan 10mg  PRN every 12 hours aside from the PRN zofran. Also activated the PRN Mylanta. Reglan and Mylanta administered. Kept on semi fowlers position. Patient verbalized relieved 1 hour post reglan and mylanta administration. Need attended, will continue to monitor. VSS, IVF NS at 80ml/hr infusing well.  Kept safe and comfortable.

## 2017-02-13 NOTE — H&P (Signed)
[facility] [place] NAME: [name]    MR#:  [id number]  DATE OF BIRTH:  [birth year]  DATE OF ADMISSION:  02/13/2017  PRIMARY CARE PHYSICIAN: Katheren Shams   REQUESTING/REFERRING PHYSICIAN:   CHIEF COMPLAINT:  Nausea vomiting and headache  HISTORY OF PRESENT ILLNESS:  [name]  is a 83 y.o. female with a known history of Congestive heart failure, COPD, type disorders, hypertension, mixed polyneuropathy sees Dr. Brigitte Pulse as an outpatient is presenting to the ED with a chief complaint of intact to nausea and vomiting. Patient also reporting her-chronic headache is getting worse Denies any abdominal pain or diarrhea. She has history of GERD. CT angiogram of the head is negative  PAST MEDICAL HISTORY:   Past Medical History:  Diagnosis Date  . Anxiety   . Arthritis    hands  . Cancer ([place])   . CHF (congestive heart failure) (HCC)    swelling of feet and legs  . COPD (chronic obstructive pulmonary disease) (HCC)    O2 use at night 2 liters  . DDD (degenerative disc disease), cervical    cervical laminectomy x 2  . Deaf, right   . Depression   . Diabetes mellitus without complication ([place])   . GERD (gastroesophageal reflux disease)   . High cholesterol   . History of hiatal hernia   . Hypertension    controlled on meds  . Neuromuscular disorder (HCC)    neuropathy  . PONV (postoperative nausea and vomiting)   . Shortness of breath dyspnea   . Vertigo    last episode approx 1 yr ago  . Wears dentures    full upper and lower    PAST SURGICAL HISTOIRY:   Past Surgical History:  Procedure Laterality Date  . ABDOMINAL HYSTERECTOMY  1963  . APPENDECTOMY    . BACK SURGERY     cervical laminectomy x2  . CATARACT EXTRACTION W/PHACO Right 03/10/2016   Procedure: CATARACT EXTRACTION PHACO AND INTRAOCULAR LENS PLACEMENT (IOC);  Surgeon: Leandrew Koyanagi, MD;  Location: [facility];  Service:  Ophthalmology;  Laterality: Right;  DIABETIC - oral meds  . CATARACT EXTRACTION W/PHACO Left 04/07/2016   Procedure: CATARACT EXTRACTION PHACO AND INTRAOCULAR LENS PLACEMENT (IOC);  Surgeon: Leandrew Koyanagi, MD;  Location: [facility];  Service: Ophthalmology;  Laterality: Left;  DIABETIC, oral med [place]    . ESOPHAGOGASTRODUODENOSCOPY N/A 07/18/2015   Procedure: ESOPHAGOGASTRODUODENOSCOPY (EGD);  Surgeon: Hulen Luster, MD;  Location: [facility] ENDOSCOPY;  Service: Gastroenterology;  Laterality: N/A;  . JOINT REPLACEMENT     hip / also revision  . SHOULDER SURGERY      SOCIAL HISTORY:   Social History  Substance Use Topics  . Smoking status: Former Smoker    Packs/day: 0.25    Years: 20.00    Types: Cigarettes    Quit date: 03/14/2000  . Smokeless tobacco: Never Used  . Alcohol use No    FAMILY HISTORY:  No family history on file.  DRUG ALLERGIES:   Allergies  Allergen Reactions  . Ciprofloxacin Nausea And Vomiting  . Hydrocodone Nausea Only  . Sertraline Nausea And Vomiting  . Codeine Nausea And Vomiting    REVIEW OF SYSTEMS:  CONSTITUTIONAL: No fever, fatigue or weakness.  EYES: No blurred or double vision.  EARS, NOSE, AND THROAT: No tinnitus or ear pain. Reporting worsening of her headache RESPIRATORY: No cough, shortness of breath, wheezing or hemoptysis.  CARDIOVASCULAR: No chest  pain, orthopnea, edema.  GASTROINTESTINAL: naReporting intractable nausea, vomiting,  denies hematemesis, diarrhea or abdominal pain.  GENITOURINARY: No dysuria, hematuria.  ENDOCRINE: No polyuria, nocturia,  HEMATOLOGY: No anemia, easy bruising or bleeding SKIN: No rash or lesion. MUSCULOSKELETAL: No joint pain or arthritis.   NEUROLOGIC: No tingling, numbness, weakness.  PSYCHIATRY: No anxiety or depression.   MEDICATIONS AT HOME:   Prior to Admission medications   Medication Sig Start Date End Date Taking? Authorizing Provider  aspirin EC 81 MG tablet  Take 81 mg by mouth daily. breakfast   Yes [provider]  cyanocobalamin 1000 MCG tablet Take 1,000 mcg by mouth daily. breakfast   Yes [provider]  furosemide (LASIX) 20 MG tablet Take 20 mg by mouth daily. May take second dose if needed for swelling.   Yes [provider]  gabapentin (NEURONTIN) 400 MG capsule Take 1 capsule (400 mg total) by mouth at bedtime. 03/15/13  Yes Regal, Tamala Fothergill, DPM  glucose blood test strip 1 each by Other route as needed for other. Use as instructed   Yes [provider]  lisinopril (PRINIVIL,ZESTRIL) 5 MG tablet Take 1 tablet (5 mg total) by mouth daily. 08/08/16 08/08/17 Yes Delman Kitten, MD  metoprolol tartrate (LOPRESSOR) 25 MG tablet Take 25 mg by mouth daily. breakfast   Yes [provider]  OXYGEN Inhale 2 L into the lungs as needed (at night and as needed).   Yes [provider]  pantoprazole (PROTONIX) 40 MG tablet Take 40 mg by mouth daily.    Yes [provider]  simvastatin (ZOCOR) 20 MG tablet Take 20 mg by mouth daily. breakfast   Yes [provider]  solifenacin (VESICARE) 10 MG tablet Take 10 mg by mouth daily. breakfast   Yes [provider]  traMADol (ULTRAM) 50 MG tablet Take 1 tablet (50 mg total) by mouth every 6 (six) hours as needed. 04/16/16 04/16/17 Yes McShane, Gerda Diss, MD  glipiZIDE (GLUCOTROL XL) 2.5 MG 24 hr tablet Take 2.5 mg by mouth daily with breakfast.    [provider]  ondansetron (ZOFRAN ODT) 8 MG disintegrating tablet Take 1 tablet (8 mg total) by mouth every 8 (eight) hours as needed for nausea or vomiting. Patient not taking: Reported on 04/01/2016 07/07/15   Carrie Mew, MD      VITAL SIGNS:  Blood pressure (!) 185/99, pulse 93, temperature 98.1 F (36.7 C), temperature source Oral, resp. rate 18, height 5\' 3"  (1.6 m), weight 83.9 kg (185 lb), SpO2 93 %.  PHYSICAL EXAMINATION:  GENERAL:  83 y.o.-year-old patient lying in the  bed with no acute distress.  EYES: Pupils equal, round, reactive to light and accommodation. No scleral icterus. Extraocular muscles intact.  HEENT: Head atraumatic, normocephalic. Oropharynx and nasopharynx clear.  NECK:  Supple, no jugular venous distention. No thyroid enlargement, no tenderness.  LUNGS: Normal breath sounds bilaterally, no wheezing, rales,rhonchi or crepitation. No use of accessory muscles of respiration.  CARDIOVASCULAR: S1, S2 normal. No murmurs, rubs, or gallops.  ABDOMEN: Soft, nontender, nondistended. Bowel sounds present. No organomegaly or mass.  EXTREMITIES: No pedal edema, cyanosis, or clubbing.  NEUROLOGIC: Cranial nerves II through XII are intact. Muscle strength 5/5 in all extremities. Sensation intact. Gait not checked.  PSYCHIATRIC: The patient is alert and oriented x 3.  SKIN: No obvious rash, lesion, or ulcer.   LABORATORY PANEL:   CBC  Recent Labs Lab 02/13/17 1100  WBC 13.0*  HGB 15.3  HCT 46.9  PLT 237   ------------------------------------------------------------------------------------------------------------------  Chemistries   Recent Labs Lab 02/13/17 1100  NA 137  K 4.1  CL 102  CO2 28  GLUCOSE 133*  BUN 17  CREATININE 0.99  CALCIUM 9.6  AST 23  ALT 24  ALKPHOS 82  BILITOT 0.8   ------------------------------------------------------------------------------------------------------------------  Cardiac Enzymes No results for input(s): TROPONINI in the last 168 hours. ------------------------------------------------------------------------------------------------------------------  RADIOLOGY:  Ct Angio Head W Or Wo Contrast  Result Date: 02/13/2017 CLINICAL DATA:  82 year old female with worst headache of life. Nausea vomiting. EXAM: CT ANGIOGRAPHY HEAD TECHNIQUE: Multidetector CT imaging of the head was performed using the standard protocol during bolus administration of intravenous contrast. Multiplanar CT image  reconstructions and MIPs were obtained to evaluate the vascular anatomy. CONTRAST:  75 mL Isovue 370 COMPARISON:  Head CT without contrast 08/08/2016 and earlier. FINDINGS: CT HEAD Brain: Stable cerebral volume. Stable ventricle size and configuration. No midline shift, mass effect, or evidence of intracranial mass lesion. No acute intracranial hemorrhage identified. Stable gray-white matter differentiation throughout the brain. No cortically based acute infarct identified. Chronic partially empty sella. Calvarium and skull base: Stable. No acute osseous abnormality identified. Paranasal sinuses: Visualized paranasal sinuses and mastoids are stable and well pneumatized. Orbits: No acute orbit or scalp soft tissue findings. CTA HEAD Posterior circulation: Dominant distal left vertebral artery. Patent PICA origins. Patent vertebrobasilar junction. No basilar stenosis. AICA and SCA origins are normal. Left PCA origin is normal. Fetal type right PCA origin suspected with a diminutive right P1. The left posterior communicating artery is also present. Bilateral PCA branches are within normal limits. Anterior circulation: Negative distal cervical ICAs. Both ICA siphons are patent with left greater than right calcified atherosclerosis. No significant siphon stenosis. Normal ophthalmic and posterior communicating artery origins. Patent carotid termini. Dominant left and diminutive or absent right ACA A1 segment. Anterior communicating artery and bilateral ACA branches are within normal limits. Normal right MCA origin. Right MCA M1 segment, bifurcation, and right MCA branches are within normal limits. Mild irregularity and stenosis at the left MCA origin (series 9, image 18), which does not appear hemodynamically significant. Left MCA M1 segment, trifurcation, and left MCA branches are within normal limits. Venous sinuses: Patent on delayed images. Anatomic variants: Dominant distal left vertebral artery. Fetal type right PCA  origin. Dominant left and diminutive or absent right ACA A1 segments. Delayed phase: No abnormal enhancement identified. Review of the MIP images confirms the above findings IMPRESSION: 1. Negative intracranial CTA aside from ICA siphon and left MCA origin atherosclerosis without hemodynamically significant stenosis. No intracranial aneurysm identified. 2. Stable CT appearance of the brain since February. No acute intracranial abnormality. Electronically Signed   By: Genevie Ann M.D.   On: 02/13/2017 16:07    EKG:   Orders placed or performed during the hospital encounter of 08/08/16  . ED EKG  . ED EKG  . EKG 12-Lead  . EKG 12-Lead    IMPRESSION AND PLAN:   Nedra Mcinnis  is a 83 y.o. female with a known history of Congestive heart failure, COPD, type disorders, hypertension, mixed polyneuropathy sees Dr. Brigitte Pulse as an outpatient is presenting to the ED with a chief complaint of intact to nausea and vomiting. Patient also reporting her-chronic headache is getting worse  #Acute gastritis with intractable nausea and vomiting Admit to MedSurg unit Clear liquid, diet, IV fluids, antiemetics and supportive treatment PPI Consider GI consult if no improvement Check stool for H. Pylori  #Acute on chronic Intractable  headache CTA head-no acute abnormality Pain management as needed Consult neurology Patient follows with Dr. Manuella Ghazi as an outpatient  #Chronic diastolic congestive heart failure Currently patient is asymptomatic Monitor intake and output and daily weights Holding Lasix as patient is on clear liquid diet and come in with intractable nausea and vomiting  #Essential hypertension Continue home medications lisinopril and Lopressor  #Diabetes mellitus Diabetic medications were discontinued by neurologist lately as she is having frequent headaches Sliding scale insulin check hemoglobin A1c  #Mixed sensory motor polyneuropathy Continue home medication Neurontin and outpatient follow-up  with neurology Dr. Manuella Ghazi  GI prophylaxis with Protonix  All the records are reviewed and case discussed with ED provider. Management plans discussed with the patient, family and they are in agreement.  CODE STATUS: fc, daughter HCPOA  TOTAL TIME TAKING CARE OF THIS PATIENT: 45  minutes.   Note: This dictation was prepared with Dragon dictation along with smaller phrase technology. Any transcriptional errors that result from this process are unintentional.  Nicholes Mango M.D on 02/13/2017 at 5:41 PM  Between 7am to 6pm - Pager - [phone]  After 6pm go to [email or web address] - password [email or web address] [facility]  [facility]  Office  [phone]  CC: Primary care physician; Katheren Shams

## 2017-02-13 NOTE — ED Provider Notes (Signed)
[facility] Emergency Department Provider Note   ____________________________________________    I have reviewed the triage vital signs and the nursing notes.   HISTORY  Chief Complaint Emesis     HPI [name] is a 83 y.o. female who presents with nausea and vomiting. Patient reports this developed last night and she has been unable tolerate by mouth's all day. She denies fevers or chills. She denies abdominal pain. She reports she has had headaches over the last 3 months that she has been working with her PCP and she wonders if this may have to do with it. She does report normal imaging studies and actually reports her headache is better than usual. No abdominal pain as above. No diarrhea, normal stool. No sick contacts reported. He has not taken anything for this.   Past Medical History:  Diagnosis Date  . Anxiety   . Arthritis    hands  . Cancer ([place])   . CHF (congestive heart failure) (HCC)    swelling of feet and legs  . COPD (chronic obstructive pulmonary disease) (HCC)    O2 use at night 2 liters  . DDD (degenerative disc disease), cervical    cervical laminectomy x 2  . Deaf, right   . Depression   . Diabetes mellitus without complication ([place])   . GERD (gastroesophageal reflux disease)   . High cholesterol   . History of hiatal hernia   . Hypertension    controlled on meds  . Neuromuscular disorder (HCC)    neuropathy  . PONV (postoperative nausea and vomiting)   . Shortness of breath dyspnea   . Vertigo    last episode approx 1 yr ago  . Wears dentures    full upper and lower    There are no active problems to display for this patient.   Past Surgical History:  Procedure Laterality Date  . ABDOMINAL HYSTERECTOMY  1963  . APPENDECTOMY    . BACK SURGERY     cervical laminectomy x2  . CATARACT EXTRACTION W/PHACO Right 03/10/2016   Procedure: CATARACT EXTRACTION PHACO AND INTRAOCULAR LENS PLACEMENT (IOC);  Surgeon:  Leandrew Koyanagi, MD;  Location: [facility];  Service: Ophthalmology;  Laterality: Right;  DIABETIC - oral meds  . CATARACT EXTRACTION W/PHACO Left 04/07/2016   Procedure: CATARACT EXTRACTION PHACO AND INTRAOCULAR LENS PLACEMENT (IOC);  Surgeon: Leandrew Koyanagi, MD;  Location: [facility];  Service: Ophthalmology;  Laterality: Left;  DIABETIC, oral med [place]    . ESOPHAGOGASTRODUODENOSCOPY N/A 07/18/2015   Procedure: ESOPHAGOGASTRODUODENOSCOPY (EGD);  Surgeon: Hulen Luster, MD;  [facility] ENDOSCOPY;  Service: Gastroenterology;  Laterality: N/A;  . JOINT REPLACEMENT     hip / also revision  . SHOULDER SURGERY      Prior to Admission medications   Medication Sig Start Date End Date Taking? Authorizing Provider  aspirin EC 81 MG tablet Take 81 mg by mouth daily. breakfast   Yes [provider]  cyanocobalamin 1000 MCG tablet Take 1,000 mcg by mouth daily. breakfast   Yes [provider]  furosemide (LASIX) 20 MG tablet Take 20 mg by mouth daily. May take second dose if needed for swelling.   Yes [provider]  gabapentin (NEURONTIN) 400 MG capsule Take 1 capsule (400 mg total) by mouth at bedtime. 03/15/13  Yes Regal, Tamala Fothergill, DPM  glucose blood test strip 1 each by Other route as needed for other. Use as instructed  Yes [provider]  lisinopril (PRINIVIL,ZESTRIL) 5 MG tablet Take 1 tablet (5 mg total) by mouth daily. 08/08/16 08/08/17 Yes Delman Kitten, MD  metoprolol tartrate (LOPRESSOR) 25 MG tablet Take 25 mg by mouth daily. breakfast   Yes [provider]  OXYGEN Inhale 2 L into the lungs as needed (at night and as needed).   Yes [provider]  pantoprazole (PROTONIX) 40 MG tablet Take 40 mg by mouth daily.    Yes [provider]  simvastatin (ZOCOR) 20 MG tablet Take 20 mg by mouth daily. breakfast   Yes [provider]  solifenacin (VESICARE) 10 MG tablet Take 10 mg by  mouth daily. breakfast   Yes [provider]  traMADol (ULTRAM) 50 MG tablet Take 1 tablet (50 mg total) by mouth every 6 (six) hours as needed. 04/16/16 04/16/17 Yes McShane, Gerda Diss, MD  glipiZIDE (GLUCOTROL XL) 2.5 MG 24 hr tablet Take 2.5 mg by mouth daily with breakfast.    [provider]  ondansetron (ZOFRAN ODT) 8 MG disintegrating tablet Take 1 tablet (8 mg total) by mouth every 8 (eight) hours as needed for nausea or vomiting. Patient not taking: Reported on 04/01/2016 07/07/15   Carrie Mew, MD     Allergies Ciprofloxacin; Hydrocodone; Sertraline; and Codeine  No family history on file.  Social History Social History  Substance Use Topics  . Smoking status: Former Smoker    Packs/day: 0.25    Years: 20.00    Types: Cigarettes    Quit date: 03/14/2000  . Smokeless tobacco: Never Used  . Alcohol use No    Review of Systems  Constitutional: No fever/chills Eyes: No visual changes.  ENT: No sore throat. Cardiovascular: Denies chest pain. Respiratory: Denies shortness of breath. Gastrointestinal: As above  Genitourinary: Negative for dysuria. Musculoskeletal: Negative for back pain. Skin: Negative for rash. Neurological: Negative for headaches    ____________________________________________   PHYSICAL EXAM:  VITAL SIGNS: ED Triage Vitals  Enc Vitals Group     BP 02/13/17 1102 (!) 184/69     Pulse Rate 02/13/17 1102 89     Resp 02/13/17 1102 16     Temp 02/13/17 1102 98.2 F (36.8 C)     Temp Source 02/13/17 1102 Oral     SpO2 02/13/17 1102 95 %     Weight 02/13/17 1102 83.9 kg (185 lb)     Height 02/13/17 1102 1.6 m (5\' 3" )     Head Circumference --      Peak Flow --      Pain Score 02/13/17 1101 5     Pain Loc --      Pain Edu? --      Excl. in [place]? --     Constitutional: Alert and oriented. No acute distress. Pleasant and interactive Eyes: Conjunctivae are normal.  Head: Atraumatic. Nose: No  congestion/rhinnorhea. Mouth/Throat: Mucous membranes are moist.   Neck:  Painless ROM Cardiovascular: Normal rate, regular rhythm. Grossly normal heart sounds.  Good peripheral circulation. Respiratory: Normal respiratory effort.  No retractions. Lungs CTAB. Gastrointestinal: Soft and nontender. No distention.   Genitourinary: deferred Musculoskeletal:  Warm and well perfused Neurologic:  Normal speech and language. No gross focal neurologic deficits are appreciated.  Skin:  Skin is warm, dry and intact. No rash noted. Psychiatric: Mood and affect are normal. Speech and behavior are normal.  ____________________________________________   LABS (all labs ordered are listed, but only abnormal results are displayed)  Labs Reviewed  COMPREHENSIVE METABOLIC  PANEL - Abnormal; Notable for the following:       Result Value   Glucose, Bld 133 (*)    GFR calc non Af Amer 52 (*)    GFR calc Af Amer 60 (*)    All other components within normal limits  CBC - Abnormal; Notable for the following:    WBC 13.0 (*)    RBC 5.40 (*)    RDW 15.7 (*)    All other components within normal limits  LIPASE, BLOOD  URINALYSIS, COMPLETE (UACMP) WITH MICROSCOPIC   ____________________________________________  EKG  None ____________________________________________  RADIOLOGY  None ____________________________________________   PROCEDURES  Procedure(s) performed: No    Critical Care performed: No ____________________________________________   INITIAL IMPRESSION / ASSESSMENT AND PLAN / ED COURSE  Pertinent labs & imaging results that were available during my care of the patient were reviewed by me and considered in my medical decision making (see chart for details).  Patient presented with complaints of nausea and vomiting, no diarrhea. No abdominal pain. Lab work is overall reassuring, mildly elevated white blood cell count. Unclear cause for the patient's gastritis, could be viral as  this is frequent in the community at this time. Do not feel imaging is required at this time, we will treat symptomatically and reevaluate.  Patient continued to have nausea and vomiting and is not tolerating by mouth's even after multiple doses of IV Zofran. I will admit to the hospitalist for further management    ____________________________________________   FINAL CLINICAL IMPRESSION(S) / ED DIAGNOSES  Final diagnoses:  Intractable vomiting with nausea, unspecified vomiting type      NEW MEDICATIONS STARTED DURING THIS VISIT:  New Prescriptions   No medications on file     Note:  This document was prepared using Dragon voice recognition software and may include unintentional dictation errors.    Lavonia Drafts, MD 02/13/17 1414

## 2017-02-14 DIAGNOSIS — R51 Headache: Secondary | ICD-10-CM

## 2017-02-14 LAB — COMPREHENSIVE METABOLIC PANEL
ALBUMIN: 3.6 g/dL (ref 3.5–5.0)
ALT: 17 U/L (ref 14–54)
ANION GAP: 7 (ref 5–15)
AST: 16 U/L (ref 15–41)
Alkaline Phosphatase: 60 U/L (ref 38–126)
BILIRUBIN TOTAL: 0.8 mg/dL (ref 0.3–1.2)
BUN: 17 mg/dL (ref 6–20)
CO2: 26 mmol/L (ref 22–32)
Calcium: 9.3 mg/dL (ref 8.9–10.3)
Chloride: 107 mmol/L (ref 101–111)
Creatinine, Ser: 1.05 mg/dL — ABNORMAL HIGH (ref 0.44–1.00)
GFR calc non Af Amer: 48 mL/min — ABNORMAL LOW (ref >60)
GFR, EST AFRICAN AMERICAN: 56 mL/min — AB (ref >60)
Glucose, Bld: 108 mg/dL — ABNORMAL HIGH (ref 65–99)
POTASSIUM: 4.1 mmol/L (ref 3.5–5.1)
Sodium: 140 mmol/L (ref 135–145)
TOTAL PROTEIN: 6.3 g/dL — AB (ref 6.5–8.1)

## 2017-02-14 LAB — TSH: TSH: 2.311 u[IU]/mL (ref 0.350–4.500)

## 2017-02-14 LAB — HEMOGLOBIN A1C
HEMOGLOBIN A1C: 5.7 % — AB (ref 4.8–5.6)
MEAN PLASMA GLUCOSE: 116.89 mg/dL

## 2017-02-14 LAB — CBC
HEMATOCRIT: 41.1 % (ref 35.0–47.0)
Hemoglobin: 13.6 g/dL (ref 12.0–16.0)
MCH: 28.4 pg (ref 26.0–34.0)
MCHC: 33 g/dL (ref 32.0–36.0)
MCV: 86.2 fL (ref 80.0–100.0)
Platelets: 210 10*3/uL (ref 150–440)
RBC: 4.77 MIL/uL (ref 3.80–5.20)
RDW: 15.5 % — AB (ref 11.5–14.5)
WBC: 11.4 10*3/uL — ABNORMAL HIGH (ref 3.6–11.0)

## 2017-02-14 LAB — GLUCOSE, CAPILLARY
GLUCOSE-CAPILLARY: 94 mg/dL (ref 65–99)
Glucose-Capillary: 92 mg/dL (ref 65–99)
Glucose-Capillary: 82 mg/dL (ref 65–99)
Glucose-Capillary: 124 mg/dL — ABNORMAL HIGH (ref 65–99)

## 2017-02-14 MED ORDER — CHLORHEXIDINE GLUCONATE 0.12 % MT SOLN
15.0000 mL | Freq: Two times a day (BID) | OROMUCOSAL | Status: DC
  Administered 2017-02-14 – 2017-02-17 (×7): 15 mL via OROMUCOSAL
  Filled 2017-02-14 (×7): qty 15

## 2017-02-14 MED ORDER — HYDRALAZINE HCL 50 MG PO TABS
25.0000 mg | ORAL_TABLET | Freq: Three times a day (TID) | ORAL | Status: DC
  Administered 2017-02-14 – 2017-02-15 (×4): 25 mg via ORAL
  Filled 2017-02-14 (×4): qty 1

## 2017-02-14 MED ORDER — HYDRALAZINE HCL 20 MG/ML IJ SOLN
10.0000 mg | Freq: Four times a day (QID) | INTRAMUSCULAR | Status: DC | PRN
  Administered 2017-02-16 (×2): 10 mg via INTRAVENOUS
  Filled 2017-02-14 (×2): qty 1

## 2017-02-14 MED ORDER — MORPHINE SULFATE (PF) 2 MG/ML IV SOLN
1.0000 mg | Freq: Once | INTRAVENOUS | Status: AC
  Administered 2017-02-14: 1 mg via INTRAVENOUS
  Filled 2017-02-14: qty 1

## 2017-02-14 MED ORDER — SUCRALFATE 1 GM/10ML PO SUSP
1.0000 g | Freq: Three times a day (TID) | ORAL | Status: DC
  Administered 2017-02-14 – 2017-02-17 (×12): 1 g via ORAL
  Filled 2017-02-14 (×12): qty 10

## 2017-02-14 MED ORDER — ORAL CARE MOUTH RINSE
15.0000 mL | Freq: Two times a day (BID) | OROMUCOSAL | Status: DC
  Administered 2017-02-14 – 2017-02-16 (×5): 15 mL via OROMUCOSAL

## 2017-02-14 NOTE — Plan of Care (Signed)
Problem: Physical Regulation: Goal: Ability to maintain clinical measurements within normal limits will improve Outcome: Progressing In am BP 196/78. Hydralazine PO initiated with good effect.  Problem: Nutrition: Goal: Adequate nutrition will be maintained Outcome: Progressing Pt tolerates clear liquid diet. Denies n/v/abd pain. Diet advanced to full liquid diet.

## 2017-02-14 NOTE — Consult Note (Signed)
Referring Provider: Dr. Manuella Ghazi Primary Care Physician:  Katheren Shams Primary Gastroenterologist:  Althia Forts  Reason for Consultation:  Epigastric pain, nausea/vomiting  HPI: [name] is a 83 y.o. female with a history of esophagitis seen on an EGD in 2017 seen for a consult due to epigastric pain and nausea/vomiting. Reports intermittent epigastric pain similar to this pain that has occurred for years. Pain is worse on an empty stomach and improves after eating. Has had recurrent nausea/vomiting for the past 3 days. Belching helps relieve the pain. Occasional solid-food dysphagia but none recently. EGD in 2017 showed mild reflux esophagitis and a benign-appearing stricture was dilated with a [place]. Takes Protonix 40 mg QD at home. Nurse at bedside.  Past Medical History:  Diagnosis Date  . Anxiety   . Arthritis    hands  . Cancer ([place])   . CHF (congestive heart failure) (HCC)    swelling of feet and legs  . COPD (chronic obstructive pulmonary disease) (HCC)    O2 use at night 2 liters  . DDD (degenerative disc disease), cervical    cervical laminectomy x 2  . Deaf, right   . Depression   . Diabetes mellitus without complication ([place])   . GERD (gastroesophageal reflux disease)   . High cholesterol   . History of hiatal hernia   . Hypertension    controlled on meds  . Neuromuscular disorder (HCC)    neuropathy  . PONV (postoperative nausea and vomiting)   . Shortness of breath dyspnea   . Vertigo    last episode approx 1 yr ago  . Wears dentures    full upper and lower    Past Surgical History:  Procedure Laterality Date  . ABDOMINAL HYSTERECTOMY  1963  . APPENDECTOMY    . BACK SURGERY     cervical laminectomy x2  . CATARACT EXTRACTION W/PHACO Right 03/10/2016   Procedure: CATARACT EXTRACTION PHACO AND INTRAOCULAR LENS PLACEMENT (IOC);  Surgeon: Leandrew Koyanagi, MD;  Location: [facility];  Service: Ophthalmology;  Laterality: Right;   DIABETIC - oral meds  . CATARACT EXTRACTION W/PHACO Left 04/07/2016   Procedure: CATARACT EXTRACTION PHACO AND INTRAOCULAR LENS PLACEMENT (IOC);  Surgeon: Leandrew Koyanagi, MD;  Location: [facility];  Service: Ophthalmology;  Laterality: Left;  DIABETIC, oral med [place]    . ESOPHAGOGASTRODUODENOSCOPY N/A 07/18/2015   Procedure: ESOPHAGOGASTRODUODENOSCOPY (EGD);  Surgeon: Hulen Luster, MD;  Location: [facility] ENDOSCOPY;  Service: Gastroenterology;  Laterality: N/A;  . JOINT REPLACEMENT     hip / also revision  . SHOULDER SURGERY      Prior to Admission medications   Medication Sig Start Date End Date Taking? Authorizing Provider  aspirin EC 81 MG tablet Take 81 mg by mouth daily. breakfast   Yes [provider]  cyanocobalamin 1000 MCG tablet Take 1,000 mcg by mouth daily. breakfast   Yes [provider]  furosemide (LASIX) 20 MG tablet Take 20 mg by mouth daily. May take second dose if needed for swelling.   Yes [provider]  gabapentin (NEURONTIN) 400 MG capsule Take 1 capsule (400 mg total) by mouth at bedtime. 03/15/13  Yes Regal, Tamala Fothergill, DPM  glucose blood test strip 1 each by Other route as needed for other. Use as instructed   Yes [provider]  lisinopril (PRINIVIL,ZESTRIL) 5 MG tablet Take 1 tablet (5 mg total) by mouth daily. 08/08/16 08/08/17 Yes Delman Kitten, MD  metoprolol tartrate (LOPRESSOR) 25 MG  tablet Take 25 mg by mouth daily. breakfast   Yes [provider]  OXYGEN Inhale 2 L into the lungs as needed (at night and as needed).   Yes [provider]  pantoprazole (PROTONIX) 40 MG tablet Take 40 mg by mouth daily.    Yes [provider]  simvastatin (ZOCOR) 20 MG tablet Take 20 mg by mouth daily. breakfast   Yes [provider]  solifenacin (VESICARE) 10 MG tablet Take 10 mg by mouth daily. breakfast   Yes [provider]  traMADol (ULTRAM) 50 MG tablet Take 1 tablet  (50 mg total) by mouth every 6 (six) hours as needed. 04/16/16 04/16/17 Yes McShane, Gerda Diss, MD  glipiZIDE (GLUCOTROL XL) 2.5 MG 24 hr tablet Take 2.5 mg by mouth daily with breakfast.    [provider]  ondansetron (ZOFRAN ODT) 8 MG disintegrating tablet Take 1 tablet (8 mg total) by mouth every 8 (eight) hours as needed for nausea or vomiting. Patient not taking: Reported on 04/01/2016 07/07/15   Carrie Mew, MD    Scheduled Meds: . aspirin EC  81 mg Oral Daily  . darifenacin  7.5 mg Oral Daily  . enoxaparin (LOVENOX) injection  40 mg Subcutaneous Q24H  . gabapentin  400 mg Oral QHS  . hydrALAZINE  25 mg Oral Q8H  . insulin aspart  0-5 Units Subcutaneous QHS  . insulin aspart  0-9 Units Subcutaneous TID WC  . lisinopril  5 mg Oral Daily  . metoprolol tartrate  25 mg Oral Daily  . pantoprazole (PROTONIX) IV  40 mg Intravenous Q12H  . simvastatin  20 mg Oral Daily   Continuous Infusions: . sodium chloride 75 mL/hr at 02/14/17 0553   PRN Meds:.acetaminophen **OR** acetaminophen, alum & mag hydroxide-simeth, hydrALAZINE, metoCLOPramide (REGLAN) injection, metoprolol tartrate, ondansetron **OR** ondansetron (ZOFRAN) IV, polyethylene glycol, traMADol  Allergies as of 02/13/2017 - Review Complete 02/13/2017  Allergen Reaction Noted  . Ciprofloxacin Nausea And Vomiting 07/17/2015  . Hydrocodone Nausea Only 12/07/2016  . Sertraline Nausea And Vomiting 07/17/2015  . Codeine Nausea And Vomiting 11/13/2014    No family history on file.  Social History   Social History  . Marital status: Widowed    Spouse name: N/A  . Number of children: N/A  . Years of education: N/A   Occupational History  . Not on file.   Social History Main Topics  . Smoking status: Former Smoker    Packs/day: 0.25    Years: 20.00    Types: Cigarettes    Quit date: 03/14/2000  . Smokeless tobacco: Never Used  . Alcohol use No  . Drug use: No  . Sexual activity: Not on file   Other Topics  Concern  . Not on file   Social History Narrative  . No narrative on file    Review of Systems: All negative except as stated above in HPI.  Physical Exam: Vital signs: Vitals:   02/14/17 0644 02/14/17 0842  BP: (!) 189/69 (!) 196/78  Pulse: (!) 58 65  Resp:  18  Temp:  98.6 F (37 C)  SpO2:  96%   Last BM Date: 02/13/17 General:   Alert,  Elderly, Well-developed, well-nourished, pleasant and cooperative in NAD Head: atraumatic Eyes: anicteric sclera ENT: oropharynx clear Lungs:  Clear throughout to auscultation.   No wheezes, crackles, or rhonchi. No acute distress. Heart:  Regular rate and rhythm; no murmurs, clicks, rubs,  or gallops. Abdomen: epigastric tenderness with guarding, soft, nondistended, +BS  Rectal:  Deferred Ext: no edema  GI:  Lab Results:  Recent Labs  02/13/17 1100 02/14/17 0347  WBC 13.0* 11.4*  HGB 15.3 13.6  HCT 46.9 41.1  PLT 237 210   BMET  Recent Labs  02/13/17 1100 02/14/17 0347  NA 137 140  K 4.1 4.1  CL 102 107  CO2 28 26  GLUCOSE 133* 108*  BUN 17 17  CREATININE 0.99 1.05*  CALCIUM 9.6 9.3   LFT  Recent Labs  02/14/17 0347  PROT 6.3*  ALBUMIN 3.6  AST 16  ALT 17  ALKPHOS 60  BILITOT 0.8   PT/INR No results for input(s): LABPROT, INR in the last 72 hours.   Studies/Results: Ct Angio Head W Or Wo Contrast  Result Date: 02/13/2017 CLINICAL DATA:  83 year old female with worst headache of life. Nausea vomiting. EXAM: CT ANGIOGRAPHY HEAD TECHNIQUE: Multidetector CT imaging of the head was performed using the standard protocol during bolus administration of intravenous contrast. Multiplanar CT image reconstructions and MIPs were obtained to evaluate the vascular anatomy. CONTRAST:  75 mL Isovue 370 COMPARISON:  Head CT without contrast 08/08/2016 and earlier. FINDINGS: CT HEAD Brain: Stable cerebral volume. Stable ventricle size and configuration. No midline shift, mass effect, or evidence of intracranial mass lesion.  No acute intracranial hemorrhage identified. Stable gray-white matter differentiation throughout the brain. No cortically based acute infarct identified. Chronic partially empty sella. Calvarium and skull base: Stable. No acute osseous abnormality identified. Paranasal sinuses: Visualized paranasal sinuses and mastoids are stable and well pneumatized. Orbits: No acute orbit or scalp soft tissue findings. CTA HEAD Posterior circulation: Dominant distal left vertebral artery. Patent PICA origins. Patent vertebrobasilar junction. No basilar stenosis. AICA and SCA origins are normal. Left PCA origin is normal. Fetal type right PCA origin suspected with a diminutive right P1. The left posterior communicating artery is also present. Bilateral PCA branches are within normal limits. Anterior circulation: Negative distal cervical ICAs. Both ICA siphons are patent with left greater than right calcified atherosclerosis. No significant siphon stenosis. Normal ophthalmic and posterior communicating artery origins. Patent carotid termini. Dominant left and diminutive or absent right ACA A1 segment. Anterior communicating artery and bilateral ACA branches are within normal limits. Normal right MCA origin. Right MCA M1 segment, bifurcation, and right MCA branches are within normal limits. Mild irregularity and stenosis at the left MCA origin (series 9, image 18), which does not appear hemodynamically significant. Left MCA M1 segment, trifurcation, and left MCA branches are within normal limits. Venous sinuses: Patent on delayed images. Anatomic variants: Dominant distal left vertebral artery. Fetal type right PCA origin. Dominant left and diminutive or absent right ACA A1 segments. Delayed phase: No abnormal enhancement identified. Review of the MIP images confirms the above findings IMPRESSION: 1. Negative intracranial CTA aside from ICA siphon and left MCA origin atherosclerosis without hemodynamically significant stenosis. No  intracranial aneurysm identified. 2. Stable CT appearance of the brain since February. No acute intracranial abnormality. Electronically Signed   By: Genevie Ann M.D.   On: 02/13/2017 16:07    Impression/Plan: 83 yo with intermittent epigastric pain, N/V and a history of intermittent solid-food dysphagia. She likely has presbyesophagus causing her dysphagia. EGD in 2017 with a benign appearing distal esophageal stricture dilated and mild erosive esophagitis. Continue Protonix 40 mg IV Q 12 hours. Start Carafate slurry and if unable to tolerate clear liquids then may need an EGD prior to discharge. Dr. Vicente Males to f/u tomorrow.    LOS: 0 days  [place] C.  02/14/2017, 10:45 AM

## 2017-02-14 NOTE — Consult Note (Signed)
Referring Physician: Dr. Manuella Ghazi    Chief Complaint: headache  HPI: [name] is an 83 y.o. female who was admitted for gastritis. She has had a headache for the last 3 months and is being followed by outpatient neurology. Patient has COPD and uses oxygen overnight. She reports she wakes with headaches, they wax and wane all day. Can be 8/10. The headaches may wake her up, they are frontal, denies light, sound sensitivity or nausea/vomiting with the headaches. Vision can get blurry when the headaches are severe 8/10.    Past Medical History:  Diagnosis Date  . Anxiety   . Arthritis    hands  . Cancer ([place])   . CHF (congestive heart failure) (HCC)    swelling of feet and legs  . COPD (chronic obstructive pulmonary disease) (HCC)    O2 use at night 2 liters  . DDD (degenerative disc disease), cervical    cervical laminectomy x 2  . Deaf, right   . Depression   . Diabetes mellitus without complication ([place])   . GERD (gastroesophageal reflux disease)   . High cholesterol   . History of hiatal hernia   . Hypertension    controlled on meds  . Neuromuscular disorder (HCC)    neuropathy  . PONV (postoperative nausea and vomiting)   . Shortness of breath dyspnea   . Vertigo    last episode approx 1 yr ago  . Wears dentures    full upper and lower    Past Surgical History:  Procedure Laterality Date  . ABDOMINAL HYSTERECTOMY  1963  . APPENDECTOMY    . BACK SURGERY     cervical laminectomy x2  . CATARACT EXTRACTION W/PHACO Right 03/10/2016   Procedure: CATARACT EXTRACTION PHACO AND INTRAOCULAR LENS PLACEMENT (IOC);  Surgeon: Leandrew Koyanagi, MD;  Location: [facility];  Service: Ophthalmology;  Laterality: Right;  DIABETIC - oral meds  . CATARACT EXTRACTION W/PHACO Left 04/07/2016   Procedure: CATARACT EXTRACTION PHACO AND INTRAOCULAR LENS PLACEMENT (IOC);  Surgeon: Leandrew Koyanagi, MD;  Location: [facility];  Service: Ophthalmology;  Laterality: Left;   DIABETIC, oral med [place]    . ESOPHAGOGASTRODUODENOSCOPY N/A 07/18/2015   Procedure: ESOPHAGOGASTRODUODENOSCOPY (EGD);  Surgeon: Hulen Luster, MD;  Location: [facility] ENDOSCOPY;  Service: Gastroenterology;  Laterality: N/A;  . JOINT REPLACEMENT     hip / also revision  . SHOULDER SURGERY      No family history on file. Social History:  reports that she quit smoking about 16 years ago. Her smoking use included Cigarettes. She has a 5.00 pack-year smoking history. She has never used smokeless tobacco. She reports that she does not drink alcohol or use drugs.  Allergies:  Allergies  Allergen Reactions  . Ciprofloxacin Nausea And Vomiting  . Hydrocodone Nausea Only  . Sertraline Nausea And Vomiting  . Codeine Nausea And Vomiting    Medications: reviewed  ROS: Headache  Physical Examination: Blood pressure 110/88, pulse (!) 59, temperature 97.8 F (36.6 C), temperature source Oral, resp. rate 18, height 5\' 3"  (1.6 m), weight 83.9 kg (185 lb), SpO2 98 %.  PHYSICAL EXAM Physical exam: Exam: Gen: NAD Eyes: anicteric sclerae, moist conjunctivae                    CV: no MRG, no carotid bruits, no peripheral edema Mental Status: Alert, follows commands, good historian  Neuro: Detailed Neurologic Exam  Speech:    No aphasia, no dysarthria  Cranial Nerves:  The pupils are equal, round, and reactive to light.. Attempted, Fundi not visualized.  EOMI. No gaze preference. Visual fields full. Face symmetric, Tongue midline. Hearing intact to voice. Shoulder shrug intact  Motor Observation:    no involuntary movements noted. Tone appears normal.     Strength:    5/5     Sensation:  Intact to LT  Plantars mute.    Laboratory Studies:  Basic Metabolic Panel:  Recent Labs Lab 02/13/17 1100 02/14/17 0347  NA 137 140  K 4.1 4.1  CL 102 107  CO2 28 26  GLUCOSE 133* 108*  BUN 17 17  CREATININE 0.99 1.05*  CALCIUM 9.6 9.3    Liver Function  Tests:  Recent Labs Lab 02/13/17 1100 02/14/17 0347  AST 23 16  ALT 24 17  ALKPHOS 82 60  BILITOT 0.8 0.8  PROT 7.2 6.3*  ALBUMIN 4.0 3.6    Recent Labs Lab 02/13/17 1100  LIPASE 18   No results for input(s): AMMONIA in the last 168 hours.  CBC:  Recent Labs Lab 02/13/17 1100 02/14/17 0347  WBC 13.0* 11.4*  HGB 15.3 13.6  HCT 46.9 41.1  MCV 86.9 86.2  PLT 237 210    Cardiac Enzymes: No results for input(s): CKTOTAL, CKMB, CKMBINDEX, TROPONINI in the last 168 hours.  BNP: Invalid input(s): POCBNP  CBG:  Recent Labs Lab 02/13/17 1639 02/13/17 2022 02/14/17 0736 02/14/17 1152 02/14/17 1629  GLUCAP 108* 124* 94 92 82    Microbiology: Results for orders placed or performed during the hospital encounter of 07/07/15  Blood culture (routine x 2)     Status: None   Collection Time: 07/07/15 11:36 AM  Result Value Ref Range Status   Specimen Description BLOOD LEFT ASSIST CONTROL  Final   Special Requests BOTTLES DRAWN AEROBIC AND ANAEROBIC [place]  Final   Culture NO GROWTH 5 DAYS  Final   Report Status 07/12/2015 FINAL  Final  Blood culture (routine x 2)     Status: None   Collection Time: 07/07/15 11:36 AM  Result Value Ref Range Status   Specimen Description BLOOD LEFT ASSIST CONTROL  Final   Special Requests BOTTLES DRAWN AEROBIC AND ANAEROBIC [place]  Final   Culture NO GROWTH 5 DAYS  Final   Report Status 07/12/2015 FINAL  Final    Coagulation Studies: No results for input(s): LABPROT, INR in the last 72 hours.  Urinalysis:  Recent Labs Lab 02/13/17 1100  COLORURINE YELLOW*  LABSPEC 1.008  PHURINE 7.0  GLUCOSEU NEGATIVE  HGBUR NEGATIVE  BILIRUBINUR NEGATIVE  KETONESUR NEGATIVE  PROTEINUR NEGATIVE  NITRITE NEGATIVE  LEUKOCYTESUR NEGATIVE    Lipid Panel: No results found for: CHOL, TRIG, HDL, CHOLHDL, VLDL, LDLCALC  HgbA1C:  Lab Results  Component Value Date   HGBA1C 5.7 (H) 02/14/2017    Urine Drug Screen:  No  results found for: LABOPIA, COCAINSCRNUR, LABBENZ, AMPHETMU, THCU, LABBARB  Alcohol Level: No results for input(s): ETH in the last 168 hours.  Imaging: Ct Angio Head W Or Wo Contrast  Result Date: 02/13/2017 CLINICAL DATA:  83 year old female with worst headache of life. Nausea vomiting. EXAM: CT ANGIOGRAPHY HEAD TECHNIQUE: Multidetector CT imaging of the head was performed using the standard protocol during bolus administration of intravenous contrast. Multiplanar CT image reconstructions and MIPs were obtained to evaluate the vascular anatomy. CONTRAST:  75 mL Isovue 370 COMPARISON:  Head CT without contrast 08/08/2016 and earlier. FINDINGS: CT HEAD Brain: Stable cerebral volume. Stable ventricle size and configuration.  No midline shift, mass effect, or evidence of intracranial mass lesion. No acute intracranial hemorrhage identified. Stable gray-white matter differentiation throughout the brain. No cortically based acute infarct identified. Chronic partially empty sella. Calvarium and skull base: Stable. No acute osseous abnormality identified. Paranasal sinuses: Visualized paranasal sinuses and mastoids are stable and well pneumatized. Orbits: No acute orbit or scalp soft tissue findings. CTA HEAD Posterior circulation: Dominant distal left vertebral artery. Patent PICA origins. Patent vertebrobasilar junction. No basilar stenosis. AICA and SCA origins are normal. Left PCA origin is normal. Fetal type right PCA origin suspected with a diminutive right P1. The left posterior communicating artery is also present. Bilateral PCA branches are within normal limits. Anterior circulation: Negative distal cervical ICAs. Both ICA siphons are patent with left greater than right calcified atherosclerosis. No significant siphon stenosis. Normal ophthalmic and posterior communicating artery origins. Patent carotid termini. Dominant left and diminutive or absent right ACA A1 segment. Anterior communicating artery and  bilateral ACA branches are within normal limits. Normal right MCA origin. Right MCA M1 segment, bifurcation, and right MCA branches are within normal limits. Mild irregularity and stenosis at the left MCA origin (series 9, image 18), which does not appear hemodynamically significant. Left MCA M1 segment, trifurcation, and left MCA branches are within normal limits. Venous sinuses: Patent on delayed images. Anatomic variants: Dominant distal left vertebral artery. Fetal type right PCA origin. Dominant left and diminutive or absent right ACA A1 segments. Delayed phase: No abnormal enhancement identified. Review of the MIP images confirms the above findings IMPRESSION: 1. Negative intracranial CTA aside from ICA siphon and left MCA origin atherosclerosis without hemodynamically significant stenosis. No intracranial aneurysm identified. 2. Stable CT appearance of the brain since February. No acute intracranial abnormality. Electronically Signed   By: Genevie Ann M.D.   On: 02/13/2017 16:07    Assessment: 83 y.o. female  [name] is an 83 y.o. female who was admitted for gastritis. She has had a headache for the last 3 months and is being followed by outpatient neurology. Patient has COPD and uses oxygen overnight. She reports she wakes with headaches, they wax and wane all day. Can be 8/10. The headaches may wake her up, they are frontal, denies light, sound sensitivity or nausea/vomiting with the headaches. Vision can get blurry when the headaches are severe 8/10. CT of the head unremarkable. She has an MRI brain pending outpatient.  Plan: 1. Patient needs a sleep study to evaluate for OSA vs hypoxemia given her daily morning headaches. 2. Uncontrolled HTN may be a contributing factor, needs management 3. Needs MRI brain w/wo contrast for new onset h/a after the age of 61 4. F/u with Dr. Manuella Ghazi outpatient  Discussed: To prevent or relieve headaches, try the following: Cool Compress. Lie down and place a  cool compress on your head.  Avoid headache triggers. If certain foods or odors seem to have triggered your migraines in the past, avoid them. A headache diary might help you identify triggers.  Include physical activity in your daily routine. Try a daily walk or other moderate aerobic exercise.  Manage stress. Find healthy ways to cope with the stressors, such as delegating tasks on your to-do list.  Practice relaxation techniques. Try deep breathing, yoga, massage and visualization.  Eat regularly. Eating regularly scheduled meals and maintaining a healthy diet might help prevent headaches. Also, drink plenty of fluids.  Follow a regular sleep schedule. Sleep deprivation might contribute to headaches Consider biofeedback. With this mind-body  technique, you learn to control certain bodily functions - such as muscle tension, heart rate and blood pressure - to prevent headaches or reduce headache pain.    Proceed to emergency room if you experience new or worsening symptoms or symptoms do not resolve, if you have new neurologic symptoms or if headache is severe, or for any concerning symptom.   Provided education and documentation from American headache Society toolbox including articles on: chronic migraine medication overuse headache, chronic migraines, prevention of migraines, behavioral and other nonpharmacologic treatments for headache.      02/14/2017, 6:40 PM

## 2017-02-14 NOTE — Care Management Note (Signed)
Case Management Note  Patient Details  Name: [name] MRN: [id number] Date of Birth: December 25, 1932  Subjective/Objective:     Admitted under observation status with the diagnosis of acute gastritis without bleeding. Lives alone. Daughter is Sorrel Cassetta [phone]). Last seen Dr. Ouida Sills 02/10/17. Prescriptions are filled at [facility] on [address]. [facility] 3 years ago. [place] for rehabilitation for hip replacement. No home oxygen. Rolling walker, raised toilet seat, shower chair, and grab bars in the home. Takes care of all basic and instrumental activities of daily living herself, drives. No falls. Good appetite. Daughter will drive              Action/Plan: No discharge needs identified at this time. Will continue to follow   Expected Discharge Date:  02/15/17               Expected Discharge Plan:     In-House Referral:     Discharge planning Services     Post Acute Care Choice:    Choice offered to:     DME Arranged:    DME Agency:     HH Arranged:    HH Agency:     Status of Service:     If discussed at [employer], dates discussed:    Additional Comments:  Shelbie Ammons, [place] Management [phone] 02/14/2017, 11:08 AM

## 2017-02-14 NOTE — Progress Notes (Signed)
[place] [facility] [place] NAME: [name]    MR#:  [id number]  DATE OF BIRTH:  Feb 13, 1933  SUBJECTIVE:  CHIEF COMPLAINT:   Chief Complaint  Patient presents with  . Emesis  Not having any nausea or vomiting anymore, BP high REVIEW OF SYSTEMS:  Review of Systems  Constitutional: Negative for chills, fever and weight loss.  HENT: Negative for nosebleeds and sore throat.   Eyes: Negative for blurred vision.  Respiratory: Negative for cough, shortness of breath and wheezing.   Cardiovascular: Negative for chest pain, orthopnea, leg swelling and PND.  Gastrointestinal: Positive for nausea and vomiting. Negative for abdominal pain, constipation, diarrhea and heartburn.  Genitourinary: Negative for dysuria and urgency.  Musculoskeletal: Negative for back pain.  Skin: Negative for rash.  Neurological: Positive for headaches. Negative for dizziness, speech change and focal weakness.  Endo/Heme/Allergies: Does not bruise/bleed easily.  Psychiatric/Behavioral: Negative for depression.   DRUG ALLERGIES:   Allergies  Allergen Reactions  . Ciprofloxacin Nausea And Vomiting  . Hydrocodone Nausea Only  . Sertraline Nausea And Vomiting  . Codeine Nausea And Vomiting   VITALS:  Blood pressure (!) 196/78, pulse 65, temperature 98.6 F (37 C), temperature source Oral, resp. rate 18, height 5\' 3"  (1.6 m), weight 83.9 kg (185 lb), SpO2 96 %. PHYSICAL EXAMINATION:  Physical Exam  Constitutional: She is oriented to person, place, and time and well-developed, well-nourished, and in no distress.  HENT:  Head: Normocephalic and atraumatic.  Eyes: Pupils are equal, round, and reactive to light. Conjunctivae and EOM are normal.  Neck: Normal range of motion. Neck supple. No tracheal deviation present. No thyromegaly present.  Cardiovascular: Normal rate, regular rhythm and normal heart sounds.   Pulmonary/Chest: Effort normal and breath sounds normal. No  respiratory distress. She has no wheezes. She exhibits no tenderness.  Abdominal: Soft. Bowel sounds are normal. She exhibits no distension. There is no tenderness.  Musculoskeletal: Normal range of motion.  Neurological: She is alert and oriented to person, place, and time. No cranial nerve deficit.  Skin: Skin is warm and dry. No rash noted.  Psychiatric: Mood and affect normal.   LABORATORY PANEL:  Female CBC  Recent Labs Lab 02/14/17 0347  WBC 11.4*  HGB 13.6  HCT 41.1  PLT 210   ------------------------------------------------------------------------------------------------------------------ Chemistries   Recent Labs Lab 02/14/17 0347  NA 140  K 4.1  CL 107  CO2 26  GLUCOSE 108*  BUN 17  CREATININE 1.05*  CALCIUM 9.3  AST 16  ALT 17  ALKPHOS 60  BILITOT 0.8   RADIOLOGY:  Ct Angio Head W Or Wo Contrast  Result Date: 02/13/2017 CLINICAL DATA:  83 year old female with worst headache of life. Nausea vomiting. EXAM: CT ANGIOGRAPHY HEAD TECHNIQUE: Multidetector CT imaging of the head was performed using the standard protocol during bolus administration of intravenous contrast. Multiplanar CT image reconstructions and MIPs were obtained to evaluate the vascular anatomy. CONTRAST:  75 mL Isovue 370 COMPARISON:  Head CT without contrast 08/08/2016 and earlier. FINDINGS: CT HEAD Brain: Stable cerebral volume. Stable ventricle size and configuration. No midline shift, mass effect, or evidence of intracranial mass lesion. No acute intracranial hemorrhage identified. Stable gray-white matter differentiation throughout the brain. No cortically based acute infarct identified. Chronic partially empty sella. Calvarium and skull base: Stable. No acute osseous abnormality identified. Paranasal sinuses: Visualized paranasal sinuses and mastoids are stable and well pneumatized. Orbits: No acute orbit or scalp soft tissue findings. CTA  HEAD Posterior circulation: Dominant distal left vertebral  artery. Patent PICA origins. Patent vertebrobasilar junction. No basilar stenosis. AICA and SCA origins are normal. Left PCA origin is normal. Fetal type right PCA origin suspected with a diminutive right P1. The left posterior communicating artery is also present. Bilateral PCA branches are within normal limits. Anterior circulation: Negative distal cervical ICAs. Both ICA siphons are patent with left greater than right calcified atherosclerosis. No significant siphon stenosis. Normal ophthalmic and posterior communicating artery origins. Patent carotid termini. Dominant left and diminutive or absent right ACA A1 segment. Anterior communicating artery and bilateral ACA branches are within normal limits. Normal right MCA origin. Right MCA M1 segment, bifurcation, and right MCA branches are within normal limits. Mild irregularity and stenosis at the left MCA origin (series 9, image 18), which does not appear hemodynamically significant. Left MCA M1 segment, trifurcation, and left MCA branches are within normal limits. Venous sinuses: Patent on delayed images. Anatomic variants: Dominant distal left vertebral artery. Fetal type right PCA origin. Dominant left and diminutive or absent right ACA A1 segments. Delayed phase: No abnormal enhancement identified. Review of the MIP images confirms the above findings IMPRESSION: 1. Negative intracranial CTA aside from ICA siphon and left MCA origin atherosclerosis without hemodynamically significant stenosis. No intracranial aneurysm identified. 2. Stable CT appearance of the brain since February. No acute intracranial abnormality. Electronically Signed   By: Genevie Ann M.D.   On: 02/13/2017 16:07   ASSESSMENT AND PLAN:  [name]  is a 83 y.o. female with a known history of Congestive heart failure, COPD, type disorders, hypertension, mixed polyneuropathy sees Dr. Manuella Ghazi as an outpatient is presenting to the ED with a chief complaint of intact to nausea and vomiting.  Patient also reporting her-chronic headache is getting worse  # Acute gastritis with intractable nausea and vomiting - not Having nausea, vomiting - Advance diet to full liquid -Appreciate GI consult, may need EGD before discharge  #Acute on chronic Intractable headache -Appreciate neurology input -May need MRI as an outpatient -May benefit from overnight oximetry and/sleep studies as an outpatient  #Chronic diastolic congestive heart failure - Monitor intake and output and daily weights - Holding Lasix as patient is on liquid diet and come in with intractable nausea and vomiting  #Uncontrolled hypertension Continue lisinopril and Lopressor, add 25 mg p.o. 3 times daily hydralazine -We will add hydralazine as needed  #Diabetes mellitus Diabetic medications were discontinued by neurologist lately as she is having frequent headaches - continue Sliding scale insulin   #Mixed sensory motor polyneuropathy Continue home medication Neurontin and outpatient follow-up with neurology Dr. Manuella Ghazi  GI prophylaxis with Protonix     All the records are reviewed and case discussed with Care Management/Social Worker. Management plans discussed with the patient, nursing, GI, Neuro and they are in agreement.  CODE STATUS: Full Code  TOTAL TIME TAKING CARE OF THIS PATIENT: 35 minutes.   More than 50% of the time was spent in counseling/coordination of care: YES  POSSIBLE D/C IN 1-2 DAYS, DEPENDING ON CLINICAL CONDITION.   Max Sane M.D on 02/14/2017 at 12:20 PM  Between 7am to 6pm - Pager - [phone]  After 6pm go to [email or web address] - Proofreader  [facility]  Office  [phone]  CC: Primary care physician; Katheren Shams  Note: This dictation was prepared with Dragon dictation along with smaller phrase technology. Any transcriptional errors that result from this process are unintentional.

## 2017-02-14 NOTE — Progress Notes (Deleted)
Pt is agitated, yelling at staff, refuses blood draw, wants IV cath to be removed and wants to leave AMA. RN tried to explained to pt the importance of repeat Hgb check and the importance of compliance with care. Pt keeps yelling. Sitter at the bedside. Dr Manuella Ghazi is aware.

## 2017-02-15 ENCOUNTER — Observation Stay: Payer: Medicare Other

## 2017-02-15 LAB — BASIC METABOLIC PANEL
ANION GAP: 5 (ref 5–15)
BUN: 13 mg/dL (ref 6–20)
CALCIUM: 9.1 mg/dL (ref 8.9–10.3)
CO2: 28 mmol/L (ref 22–32)
Chloride: 107 mmol/L (ref 101–111)
Creatinine, Ser: 1.01 mg/dL — ABNORMAL HIGH (ref 0.44–1.00)
GFR calc non Af Amer: 50 mL/min — ABNORMAL LOW (ref >60)
GFR, EST AFRICAN AMERICAN: 58 mL/min — AB (ref >60)
Glucose, Bld: 97 mg/dL (ref 65–99)
Potassium: 4.1 mmol/L (ref 3.5–5.1)
SODIUM: 140 mmol/L (ref 135–145)

## 2017-02-15 LAB — CBC
HCT: 40.6 % (ref 35.0–47.0)
Hemoglobin: 13.5 g/dL (ref 12.0–16.0)
MCH: 28.8 pg (ref 26.0–34.0)
MCHC: 33.3 g/dL (ref 32.0–36.0)
MCV: 86.4 fL (ref 80.0–100.0)
PLATELETS: 187 10*3/uL (ref 150–440)
RBC: 4.7 MIL/uL (ref 3.80–5.20)
RDW: 15.5 % — AB (ref 11.5–14.5)
WBC: 9.3 10*3/uL (ref 3.6–11.0)

## 2017-02-15 LAB — GLUCOSE, CAPILLARY
GLUCOSE-CAPILLARY: 120 mg/dL — AB (ref 65–99)
GLUCOSE-CAPILLARY: 120 mg/dL — AB (ref 65–99)
Glucose-Capillary: 74 mg/dL (ref 65–99)
Glucose-Capillary: 138 mg/dL — ABNORMAL HIGH (ref 65–99)

## 2017-02-15 MED ORDER — LORAZEPAM 2 MG/ML IJ SOLN
1.0000 mg | Freq: Once | INTRAMUSCULAR | Status: AC
  Administered 2017-02-15: 12:00:00 1 mg via INTRAVENOUS
  Filled 2017-02-15: qty 1

## 2017-02-15 MED ORDER — BUTALBITAL-APAP-CAFFEINE 50-325-40 MG PO TABS
1.0000 | ORAL_TABLET | ORAL | Status: DC | PRN
  Administered 2017-02-15 – 2017-02-17 (×5): 1 via ORAL
  Filled 2017-02-15 (×11): qty 1

## 2017-02-15 MED ORDER — HYDRALAZINE HCL 50 MG PO TABS
25.0000 mg | ORAL_TABLET | Freq: Four times a day (QID) | ORAL | Status: DC
  Administered 2017-02-15 – 2017-02-16 (×3): 25 mg via ORAL
  Filled 2017-02-15 (×3): qty 1

## 2017-02-15 MED ORDER — GADOBENATE DIMEGLUMINE 529 MG/ML IV SOLN
20.0000 mL | Freq: Once | INTRAVENOUS | Status: AC | PRN
  Administered 2017-02-15: 20 mL via INTRAVENOUS

## 2017-02-15 NOTE — Progress Notes (Addendum)
[place] [facility] [place] NAME: [name]    MR#:  [id number]  DATE OF BIRTH:  17-Nov-1932  SUBJECTIVE:  CHIEF COMPLAINT:   Chief Complaint  Patient presents with  . Emesis  Not having any nausea or vomiting anymore, BP high REVIEW OF SYSTEMS:  Review of Systems  Constitutional: Negative for chills, fever and weight loss.  HENT: Negative for nosebleeds and sore throat.   Eyes: Negative for blurred vision.  Respiratory: Negative for cough, shortness of breath and wheezing.   Cardiovascular: Negative for chest pain, orthopnea, leg swelling and PND.  Gastrointestinal: Positive for nausea and vomiting. Negative for abdominal pain, constipation, diarrhea and heartburn.  Genitourinary: Negative for dysuria and urgency.  Musculoskeletal: Negative for back pain.  Skin: Negative for rash.  Neurological: Positive for headaches. Negative for dizziness, speech change and focal weakness.  Endo/Heme/Allergies: Does not bruise/bleed easily.  Psychiatric/Behavioral: Negative for depression.   DRUG ALLERGIES:   Allergies  Allergen Reactions  . Ciprofloxacin Nausea And Vomiting  . Hydrocodone Nausea Only  . Sertraline Nausea And Vomiting  . Codeine Nausea And Vomiting   VITALS:  Blood pressure (!) 145/55, pulse 65, temperature 97.7 F (36.5 C), temperature source Oral, resp. rate 20, height 5\' 3"  (1.6 m), weight 83.9 kg (185 lb), SpO2 97 %. PHYSICAL EXAMINATION:  Physical Exam  Constitutional: She is oriented to person, place, and time and well-developed, well-nourished, and in no distress.  HENT:  Head: Normocephalic and atraumatic.  Eyes: Pupils are equal, round, and reactive to light. Conjunctivae and EOM are normal.  Neck: Normal range of motion. Neck supple. No tracheal deviation present. No thyromegaly present.  Cardiovascular: Normal rate, regular rhythm and normal heart sounds.   Pulmonary/Chest: Effort normal and breath sounds normal.  No respiratory distress. She has no wheezes. She exhibits no tenderness.  Abdominal: Soft. Bowel sounds are normal. She exhibits no distension. There is no tenderness.  Musculoskeletal: Normal range of motion.  Neurological: She is alert and oriented to person, place, and time. No cranial nerve deficit.  Skin: Skin is warm and dry. No rash noted.  Psychiatric: Mood and affect normal.   LABORATORY PANEL:  Female CBC  Recent Labs Lab 02/15/17 0427  WBC 9.3  HGB 13.5  HCT 40.6  PLT 187   ------------------------------------------------------------------------------------------------------------------ Chemistries   Recent Labs Lab 02/14/17 0347 02/15/17 0427  NA 140 140  K 4.1 4.1  CL 107 107  CO2 26 28  GLUCOSE 108* 97  BUN 17 13  CREATININE 1.05* 1.01*  CALCIUM 9.3 9.1  AST 16  --   ALT 17  --   ALKPHOS 60  --   BILITOT 0.8  --    RADIOLOGY:  Mr Jeri Cos Wo Contrast  Result Date: 02/15/2017 CLINICAL DATA:  Initial evaluation for chronic intractable headache, worsening. EXAM: MRI HEAD WITHOUT AND WITH CONTRAST TECHNIQUE: Multiplanar, multiecho pulse sequences of the brain and surrounding structures were obtained without and with intravenous contrast. CONTRAST:  17 cc MultiHance. COMPARISON:  Prior CT from 02/13/2017 as well as prior CT from 08/08/2016. Comparison also made with previous MRI from 06/20/2008. FINDINGS: Brain: Diffuse prominence of the CSF containing spaces compatible generalized age-related cerebral atrophy. Mild patchy T2/FLAIR hyperintensity within the periventricular and deep white matter both cerebral hemispheres most consistent with chronic small vessel ischemic disease, mild for age. No abnormal foci of restricted diffusion to suggest acute or subacute ischemia. No evidence for chronic infarction. No evidence for  acute or chronic intracranial hemorrhage. No mass lesion, midline shift, or mass effect. Mild diffuse ventricular prominence related to global  parenchymal volume loss of hydrocephalus. No extra-axial fluid collection para no abnormal enhancement. Incidental note made of an empty sella. Vascular: Major intracranial vascular flow voids maintained. Skull and upper cervical spine: Craniocervical junction within normal limits. Mild degenerative spondylolysis noted within the upper cervical spine without significant stenosis. Bone marrow signal intensity within normal limits. No scalp soft tissue abnormality. Sinuses/Orbits: Globes normal soft tissues within normal limits. Patient status post lens extraction bilaterally. Paranasal sinuses are clear. Small left greater than right mastoid effusions, of doubtful significance. Inner ear structures normal. Other: None. IMPRESSION: 1. No acute intracranial process identified. 2. Mild age-related cerebral atrophy with chronic small vessel ischemic disease, stable from recent CTs, but progressed relative to most recent MRI from 06/20/2008. Electronically Signed   By: Jeannine Boga M.D.   On: 02/15/2017 13:25   ASSESSMENT AND PLAN:  [name]  is a 83 y.o. female with a known history of Congestive heart failure, COPD, type disorders, hypertension, mixed polyneuropathy sees Dr. Manuella Ghazi as an outpatient is presenting to the ED with a chief complaint of intact to nausea and vomiting. Patient also reporting her-chronic headache is getting worse  # Acute gastritis with intractable nausea and vomiting - not Having nausea, vomiting - Advance diet to full liquid -Appreciate GI consult, may need EGD as an outpt  #Acute on chronic Intractable headache -Appreciate neurology input - MRI brain is negative for any acute patho -May benefit from overnight oximetry and/sleep studies as an outpatient - Fioricet prn  #Chronic diastolic congestive heart failure - Monitor intake and output and daily weights - Holding Lasix as patient is on liquid diet and come in with intractable nausea and  vomiting  #Uncontrolled hypertension Continue lisinopril and Lopressor, increase hydralazine to 25 mg p.o. 4 times daily -continue IV hydralazine as needed  #Diabetes mellitus - Diabetic medications were discontinued by neurologist lately as she is having frequent headaches - continue Sliding scale insulin   #Mixed sensory motor polyneuropathy Continue home medication Neurontin and outpatient follow-up with neurology Dr. Manuella Ghazi  GI prophylaxis with Protonix     All the records are reviewed and case discussed with Care Management/Social Worker. Management plans discussed with the patient, nursing, GI, Neuro and they are in agreement.  CODE STATUS: Full Code  TOTAL TIME TAKING CARE OF THIS PATIENT: 35 minutes.   More than 50% of the time was spent in counseling/coordination of care: YES  POSSIBLE D/C IN 1 DAYS, DEPENDING ON CLINICAL CONDITION.   Max Sane M.D on 02/15/2017 at 3:07 PM  Between 7am to 6pm - Pager - [phone]  After 6pm go to [email or web address] - Proofreader  [facility]  Office  [phone]  CC: Primary care physician; Katheren Shams  Note: This dictation was prepared with Dragon dictation along with smaller phrase technology. Any transcriptional errors that result from this process are unintentional.

## 2017-02-16 DIAGNOSIS — I10 Essential (primary) hypertension: Secondary | ICD-10-CM | POA: Diagnosis present

## 2017-02-16 DIAGNOSIS — K2900 Acute gastritis without bleeding: Secondary | ICD-10-CM | POA: Diagnosis present

## 2017-02-16 DIAGNOSIS — Z9049 Acquired absence of other specified parts of digestive tract: Secondary | ICD-10-CM | POA: Diagnosis not present

## 2017-02-16 DIAGNOSIS — Z885 Allergy status to narcotic agent status: Secondary | ICD-10-CM | POA: Diagnosis not present

## 2017-02-16 DIAGNOSIS — R112 Nausea with vomiting, unspecified: Secondary | ICD-10-CM | POA: Diagnosis present

## 2017-02-16 DIAGNOSIS — H9191 Unspecified hearing loss, right ear: Secondary | ICD-10-CM | POA: Diagnosis present

## 2017-02-16 DIAGNOSIS — Z7982 Long term (current) use of aspirin: Secondary | ICD-10-CM | POA: Diagnosis not present

## 2017-02-16 DIAGNOSIS — R51 Headache: Secondary | ICD-10-CM | POA: Diagnosis not present

## 2017-02-16 DIAGNOSIS — M19049 Primary osteoarthritis, unspecified hand: Secondary | ICD-10-CM | POA: Diagnosis present

## 2017-02-16 DIAGNOSIS — F329 Major depressive disorder, single episode, unspecified: Secondary | ICD-10-CM | POA: Diagnosis present

## 2017-02-16 DIAGNOSIS — J449 Chronic obstructive pulmonary disease, unspecified: Secondary | ICD-10-CM | POA: Diagnosis present

## 2017-02-16 DIAGNOSIS — I110 Hypertensive heart disease with heart failure: Secondary | ICD-10-CM | POA: Diagnosis present

## 2017-02-16 DIAGNOSIS — I701 Atherosclerosis of renal artery: Secondary | ICD-10-CM

## 2017-02-16 DIAGNOSIS — Z9981 Dependence on supplemental oxygen: Secondary | ICD-10-CM | POA: Diagnosis not present

## 2017-02-16 DIAGNOSIS — R1310 Dysphagia, unspecified: Secondary | ICD-10-CM | POA: Diagnosis present

## 2017-02-16 DIAGNOSIS — Z87891 Personal history of nicotine dependence: Secondary | ICD-10-CM | POA: Diagnosis not present

## 2017-02-16 DIAGNOSIS — K228 Other specified diseases of esophagus: Secondary | ICD-10-CM | POA: Diagnosis present

## 2017-02-16 DIAGNOSIS — E7800 Pure hypercholesterolemia, unspecified: Secondary | ICD-10-CM | POA: Diagnosis present

## 2017-02-16 DIAGNOSIS — F419 Anxiety disorder, unspecified: Secondary | ICD-10-CM | POA: Diagnosis present

## 2017-02-16 DIAGNOSIS — Z881 Allergy status to other antibiotic agents status: Secondary | ICD-10-CM | POA: Diagnosis not present

## 2017-02-16 DIAGNOSIS — Z96649 Presence of unspecified artificial hip joint: Secondary | ICD-10-CM | POA: Diagnosis present

## 2017-02-16 DIAGNOSIS — I5032 Chronic diastolic (congestive) heart failure: Secondary | ICD-10-CM | POA: Diagnosis present

## 2017-02-16 DIAGNOSIS — G709 Myoneural disorder, unspecified: Secondary | ICD-10-CM | POA: Diagnosis present

## 2017-02-16 DIAGNOSIS — Z7984 Long term (current) use of oral hypoglycemic drugs: Secondary | ICD-10-CM | POA: Diagnosis not present

## 2017-02-16 DIAGNOSIS — M5030 Other cervical disc degeneration, unspecified cervical region: Secondary | ICD-10-CM | POA: Diagnosis present

## 2017-02-16 DIAGNOSIS — K219 Gastro-esophageal reflux disease without esophagitis: Secondary | ICD-10-CM | POA: Diagnosis present

## 2017-02-16 DIAGNOSIS — E1142 Type 2 diabetes mellitus with diabetic polyneuropathy: Secondary | ICD-10-CM | POA: Diagnosis present

## 2017-02-16 LAB — GLUCOSE, CAPILLARY
GLUCOSE-CAPILLARY: 97 mg/dL (ref 65–99)
Glucose-Capillary: 82 mg/dL (ref 65–99)
Glucose-Capillary: 133 mg/dL — ABNORMAL HIGH (ref 65–99)
Glucose-Capillary: 114 mg/dL — ABNORMAL HIGH (ref 65–99)

## 2017-02-16 LAB — SEDIMENTATION RATE: Sed Rate: 4 mm/hr (ref 0–30)

## 2017-02-16 MED ORDER — LISINOPRIL 10 MG PO TABS
10.0000 mg | ORAL_TABLET | Freq: Every day | ORAL | Status: DC
  Administered 2017-02-16 – 2017-02-17 (×2): 10 mg via ORAL
  Filled 2017-02-16 (×2): qty 1

## 2017-02-16 MED ORDER — HYDROCHLOROTHIAZIDE 12.5 MG PO CAPS
12.5000 mg | ORAL_CAPSULE | Freq: Every day | ORAL | Status: DC
  Administered 2017-02-16 – 2017-02-17 (×2): 12.5 mg via ORAL
  Filled 2017-02-16 (×2): qty 1

## 2017-02-16 MED ORDER — HYDRALAZINE HCL 50 MG PO TABS
50.0000 mg | ORAL_TABLET | Freq: Four times a day (QID) | ORAL | Status: DC
  Administered 2017-02-16 – 2017-02-17 (×4): 50 mg via ORAL
  Filled 2017-02-16 (×5): qty 1

## 2017-02-16 NOTE — Progress Notes (Signed)
Subjective: Patient continues to complain of frontal headache.  No new neurological complaints  Objective: Current vital signs: BP (!) 145/53 (BP Location: Right Arm)   Pulse 75   Temp 97.8 F (36.6 C) (Oral)   Resp 20   Ht '5\' 3"'  (1.6 m)   Wt 83.9 kg (185 lb)   SpO2 93%   BMI 32.77 kg/m  Vital signs in last 24 hours: Temp:  [97.7 F (36.5 C)-98 F (36.7 C)] 97.8 F (36.6 C) (09/05 0825) Pulse Rate:  [61-89] 75 (09/05 1056) Resp:  [18-20] 20 (09/05 1056) BP: (145-225)/(53-92) 145/53 (09/05 1056) SpO2:  [93 %-97 %] 93 % (09/05 1056)  Intake/Output from previous day: 09/04 0701 - 09/05 0700 In: 120 [P.O.:120] Out: -  Intake/Output this shift: Total I/O In: 120 [P.O.:120] Out: -  Nutritional status: DIET SOFT Room service appropriate? Yes; Fluid consistency: Thin  Neurologic Exam: Mental Status: Alert, oriented, thought content appropriate.  Speech fluent without evidence of aphasia.  Able to follow 3 step commands without difficulty. Cranial Nerves: II: Discs flat bilaterally; Visual fields grossly normal, pupils equal, round, reactive to light and accommodation III,IV, VI: ptosis not present, extra-ocular motions intact bilaterally V,VII: smile symmetric, facial light touch sensation normal bilaterally VIII: hearing normal bilaterally IX,X: gag reflex present XI: bilateral shoulder shrug XII: midline tongue extension Motor: Right : Upper extremity   5/5    Left:     Upper extremity   5/5  Lower extremity   5/5     Lower extremity   5/5   Lab Results: Basic Metabolic Panel:  Recent Labs Lab 02/13/17 1100 02/14/17 0347 02/15/17 0427  NA 137 140 140  K 4.1 4.1 4.1  CL 102 107 107  CO2 '28 26 28  ' GLUCOSE 133* 108* 97  BUN '17 17 13  ' CREATININE 0.99 1.05* 1.01*  CALCIUM 9.6 9.3 9.1    Liver Function Tests:  Recent Labs Lab 02/13/17 1100 02/14/17 0347  AST 23 16  ALT 24 17  ALKPHOS 82 60  BILITOT 0.8 0.8  PROT 7.2 6.3*  ALBUMIN 4.0 3.6     Recent Labs Lab 02/13/17 1100  LIPASE 18   No results for input(s): AMMONIA in the last 168 hours.  CBC:  Recent Labs Lab 02/13/17 1100 02/14/17 0347 02/15/17 0427  WBC 13.0* 11.4* 9.3  HGB 15.3 13.6 13.5  HCT 46.9 41.1 40.6  MCV 86.9 86.2 86.4  PLT 237 210 187    Cardiac Enzymes: No results for input(s): CKTOTAL, CKMB, CKMBINDEX, TROPONINI in the last 168 hours.  Lipid Panel: No results for input(s): CHOL, TRIG, HDL, CHOLHDL, VLDL, LDLCALC in the last 168 hours.  CBG:  Recent Labs Lab 02/15/17 1136 02/15/17 1700 02/15/17 1949 02/16/17 0735 02/16/17 1140  GLUCAP 120* 120* 138* 44 133*    Microbiology: Results for orders placed or performed during the hospital encounter of 07/07/15  Blood culture (routine x 2)     Status: None   Collection Time: 07/07/15 11:36 AM  Result Value Ref Range Status   Specimen Description BLOOD LEFT ASSIST CONTROL  Final   Special Requests BOTTLES DRAWN AEROBIC AND ANAEROBIC [place]  Final   Culture NO GROWTH 5 DAYS  Final   Report Status 07/12/2015 FINAL  Final  Blood culture (routine x 2)     Status: None   Collection Time: 07/07/15 11:36 AM  Result Value Ref Range Status   Specimen Description BLOOD LEFT ASSIST CONTROL  Final   Special Requests BOTTLES  DRAWN AEROBIC AND ANAEROBIC [place]  Final   Culture NO GROWTH 5 DAYS  Final   Report Status 07/12/2015 FINAL  Final    Coagulation Studies: No results for input(s): LABPROT, INR in the last 72 hours.  Imaging: Mr Jeri Cos KT Contrast  Result Date: 02/15/2017 CLINICAL DATA:  Initial evaluation for chronic intractable headache, worsening. EXAM: MRI HEAD WITHOUT AND WITH CONTRAST TECHNIQUE: Multiplanar, multiecho pulse sequences of the brain and surrounding structures were obtained without and with intravenous contrast. CONTRAST:  17 cc MultiHance. COMPARISON:  Prior CT from 02/13/2017 as well as prior CT from 08/08/2016. Comparison also made with previous MRI  from 06/20/2008. FINDINGS: Brain: Diffuse prominence of the CSF containing spaces compatible generalized age-related cerebral atrophy. Mild patchy T2/FLAIR hyperintensity within the periventricular and deep white matter both cerebral hemispheres most consistent with chronic small vessel ischemic disease, mild for age. No abnormal foci of restricted diffusion to suggest acute or subacute ischemia. No evidence for chronic infarction. No evidence for acute or chronic intracranial hemorrhage. No mass lesion, midline shift, or mass effect. Mild diffuse ventricular prominence related to global parenchymal volume loss of hydrocephalus. No extra-axial fluid collection para no abnormal enhancement. Incidental note made of an empty sella. Vascular: Major intracranial vascular flow voids maintained. Skull and upper cervical spine: Craniocervical junction within normal limits. Mild degenerative spondylolysis noted within the upper cervical spine without significant stenosis. Bone marrow signal intensity within normal limits. No scalp soft tissue abnormality. Sinuses/Orbits: Globes normal soft tissues within normal limits. Patient status post lens extraction bilaterally. Paranasal sinuses are clear. Small left greater than right mastoid effusions, of doubtful significance. Inner ear structures normal. Other: None. IMPRESSION: 1. No acute intracranial process identified. 2. Mild age-related cerebral atrophy with chronic small vessel ischemic disease, stable from recent CTs, but progressed relative to most recent MRI from 06/20/2008. Electronically Signed   By: Jeannine Boga M.D.   On: 02/15/2017 13:25    Medications:  I have reviewed the patient's current medications. Scheduled: . aspirin EC  81 mg Oral Daily  . chlorhexidine  15 mL Mouth Rinse BID  . darifenacin  7.5 mg Oral Daily  . enoxaparin (LOVENOX) injection  40 mg Subcutaneous Q24H  . gabapentin  400 mg Oral QHS  . hydrALAZINE  50 mg Oral Q6H  .  hydrochlorothiazide  12.5 mg Oral Daily  . insulin aspart  0-5 Units Subcutaneous QHS  . insulin aspart  0-9 Units Subcutaneous TID WC  . lisinopril  10 mg Oral Daily  . mouth rinse  15 mL Mouth Rinse q12n4p  . metoprolol tartrate  25 mg Oral Daily  . pantoprazole (PROTONIX) IV  40 mg Intravenous Q12H  . simvastatin  20 mg Oral Daily  . sucralfate  1 g Oral TID WC & HS    Assessment/Plan: Patient with continued headache.  MRI of the brain reviewed and shows no acute changes and no evidence of mass lesion or aneurysm.    Recommendations: 1.  ESR 2.  Continue recommendations put forth by Dr. Jaynee Eagles on an outpatient basis   LOS: 0 days   Alexis Goodell, MD Neurology [phone] 02/16/2017  11:52 AM

## 2017-02-16 NOTE — Plan of Care (Signed)
Problem: Activity: Goal: Risk for activity intolerance will decrease Outcome: Progressing Pt up to bathroom x 3 with standby assist

## 2017-02-16 NOTE — Progress Notes (Signed)
[facility] [place] NAME: [name]    MR#:  [id number]  DATE OF BIRTH:  1932/07/11  SUBJECTIVE:  CHIEF COMPLAINT:   Chief Complaint  Patient presents with  . Emesis  Blood pressure remains very high, still had a headache this morning REVIEW OF SYSTEMS:  Review of Systems  Constitutional: Negative for chills, fever and weight loss.  HENT: Negative for nosebleeds and sore throat.   Eyes: Negative for blurred vision.  Respiratory: Negative for cough, shortness of breath and wheezing.   Cardiovascular: Negative for chest pain, orthopnea, leg swelling and PND.  Gastrointestinal: Positive for nausea and vomiting. Negative for abdominal pain, constipation, diarrhea and heartburn.  Genitourinary: Negative for dysuria and urgency.  Musculoskeletal: Negative for back pain.  Skin: Negative for rash.  Neurological: Positive for headaches. Negative for dizziness, speech change and focal weakness.  Endo/Heme/Allergies: Does not bruise/bleed easily.  Psychiatric/Behavioral: Negative for depression.   DRUG ALLERGIES:   Allergies  Allergen Reactions  . Ciprofloxacin Nausea And Vomiting  . Hydrocodone Nausea Only  . Sertraline Nausea And Vomiting  . Codeine Nausea And Vomiting   VITALS:  Blood pressure (!) 155/52, pulse 74, temperature (!) 97.3 F (36.3 C), temperature source Oral, resp. rate 20, height 5\' 3"  (1.6 m), weight 83.9 kg (185 lb), SpO2 94 %. PHYSICAL EXAMINATION:  Physical Exam  Constitutional: She is oriented to person, place, and time and well-developed, well-nourished, and in no distress.  HENT:  Head: Normocephalic and atraumatic.  Eyes: Pupils are equal, round, and reactive to light. Conjunctivae and EOM are normal.  Neck: Normal range of motion. Neck supple. No tracheal deviation present. No thyromegaly present.  Cardiovascular: Normal rate, regular rhythm and normal heart sounds.   Pulmonary/Chest: Effort normal and  breath sounds normal. No respiratory distress. She has no wheezes. She exhibits no tenderness.  Abdominal: Soft. Bowel sounds are normal. She exhibits no distension. There is no tenderness.  Musculoskeletal: Normal range of motion.  Neurological: She is alert and oriented to person, place, and time. No cranial nerve deficit.  Skin: Skin is warm and dry. No rash noted.  Psychiatric: Mood and affect normal.   LABORATORY PANEL:  Female CBC  Recent Labs Lab 02/15/17 0427  WBC 9.3  HGB 13.5  HCT 40.6  PLT 187   ------------------------------------------------------------------------------------------------------------------ Chemistries   Recent Labs Lab 02/14/17 0347 02/15/17 0427  NA 140 140  K 4.1 4.1  CL 107 107  CO2 26 28  GLUCOSE 108* 97  BUN 17 13  CREATININE 1.05* 1.01*  CALCIUM 9.3 9.1  AST 16  --   ALT 17  --   ALKPHOS 60  --   BILITOT 0.8  --    RADIOLOGY:  No results found. ASSESSMENT AND PLAN:  [name]  is a 83 y.o. female with a known history of Congestive heart failure, COPD, type disorders, hypertension, mixed polyneuropathy sees Dr. Manuella Ghazi as an outpatient is presenting to the ED with a chief complaint of intact to nausea and vomiting. Patient also reporting her-chronic headache is getting worse   #Uncontrolled resistant hypertension: Pressure has been running from 180s-200 -Increase dose of lisinopril 10 mg daily -Increase hydralazine dose from 25-50 mg 4 times a day -Add hydrochlorothiazide once daily -Continue metoprolol -Check creatinine aldosterone levels  - Nephrology consult for evaluation of secondary hypertension  # Acute gastritis with intractable nausea and vomiting - not Having nausea, vomiting -Tolerating soft diet -Appreciate GI consult, may  need EGD as an outpt  #Acute on chronic Intractable headache -Appreciate neurology input - MRI brain is negative for any acute patho -May benefit from overnight oximetry and/sleep studies  as an outpatient - Fioricet twice daily  #Chronic diastolic congestive heart failure - Monitor intake and output and daily weights - Holding Lasix as patient is on liquid diet and come in with intractable nausea and vomiting   #Diabetes mellitus - Diabetic medications were discontinued by neurologist lately as she is having frequent headaches - continue Sliding scale insulin   #Mixed sensory motor polyneuropathy Continue home medication Neurontin and outpatient follow-up with neurology Dr. Manuella Ghazi  GI prophylaxis with Protonix     All the records are reviewed and case discussed with Care Management/Social Worker. Management plans discussed with the patient, nursing, nephrology and they are in agreement.  CODE STATUS: Full Code  TOTAL TIME TAKING CARE OF THIS PATIENT: 35 minutes.   More than 50% of the time was spent in counseling/coordination of care: YES  POSSIBLE D/C IN 1 DAYS, DEPENDING ON CLINICAL CONDITION.   Max Sane M.D on 02/16/2017 at 3:56 PM  Between 7am to 6pm - Pager - [phone]  After 6pm go to [email or web address] - Proofreader  [facility]  Office  [phone]  CC: Primary care physician; Katheren Shams  Note: This dictation was prepared with Dragon dictation along with smaller phrase technology. Any transcriptional errors that result from this process are unintentional.

## 2017-02-16 NOTE — Consult Note (Signed)
[facility]  CONSULT NOTE    Date: 02/16/2017                  Patient Name:  [name]  MRN: [id number]  DOB: 1933-05-27  Age / Sex: 83 y.o., female         PCP: Katheren Shams                 Service Requesting Consult: Dr. Manuella Ghazi                 Reason for Consult: Hypertension            History of Present Illness: [name] is a 83 y.o. white female with hypertension, hyperlipidemia, diabetes mellitus type II, COPD, CHF, right renal artery stenosis, overactive bladder who was admitted to [facility] on 02/13/2017 for Intractable vomiting with nausea, unspecified vomiting type [R11.2]   Patient with uncontrolled blood pressure on admission. Home blood pressure regimen of metoprolol, lisinopril, and furosemide. She states her home readings show systolic in the 169C. Readings from Dr. Tonette Bihari office on 8/30 was 147/79.   Patient was last seen by my practice in 10/2013 for hypertension and right renal artery stenosis.   Patient denies any nausea or vomiting today.   Denies NSAID use. Denies licorice. No excessive caffeinated products.    Medications: Outpatient medications: Prescriptions Prior to Admission  Medication Sig Dispense Refill Last Dose  . aspirin EC 81 MG tablet Take 81 mg by mouth daily. breakfast   02/12/2017 at 0800  . cyanocobalamin 1000 MCG tablet Take 1,000 mcg by mouth daily. breakfast   Past Week at Unknown time  . furosemide (LASIX) 20 MG tablet Take 20 mg by mouth daily. May take second dose if needed for swelling.   02/12/2017 at 0800  . gabapentin (NEURONTIN) 400 MG capsule Take 1 capsule (400 mg total) by mouth at bedtime. 30 capsule 3 02/12/2017 at 2000  . glucose blood test strip 1 each by Other route as needed for other. Use as instructed   04/06/2016 at Unknown time  . lisinopril (PRINIVIL,ZESTRIL) 5 MG tablet Take 1 tablet (5 mg total) by mouth daily. 7 tablet 0 02/12/2017 at 0800  . metoprolol tartrate (LOPRESSOR) 25  MG tablet Take 25 mg by mouth daily. breakfast   02/12/2017 at 0800  . OXYGEN Inhale 2 L into the lungs as needed (at night and as needed).   04/06/2016 at Unknown time  . pantoprazole (PROTONIX) 40 MG tablet Take 40 mg by mouth daily.    02/12/2017 at 0800  . simvastatin (ZOCOR) 20 MG tablet Take 20 mg by mouth daily. breakfast   02/12/2017 at 0800  . solifenacin (VESICARE) 10 MG tablet Take 10 mg by mouth daily. breakfast   02/12/2017 at 0800  . traMADol (ULTRAM) 50 MG tablet Take 1 tablet (50 mg total) by mouth every 6 (six) hours as needed. 14 tablet 0 PRN at PRN  . glipiZIDE (GLUCOTROL XL) 2.5 MG 24 hr tablet Take 2.5 mg by mouth daily with breakfast.   Not Taking at Unknown time  . ondansetron (ZOFRAN ODT) 8 MG disintegrating tablet Take 1 tablet (8 mg total) by mouth every 8 (eight) hours as needed for nausea or vomiting. (Patient not taking: Reported on 04/01/2016) 20 tablet 0 Not Taking at Unknown time    Current medications: Current Facility-Administered Medications  Medication Dose Route Frequency Provider Last Rate Last Dose  . acetaminophen (TYLENOL) tablet 650 mg  650 mg Oral Q6H PRN Gouru, Aruna, MD   650 mg at 02/15/17 2040   Or  . acetaminophen (TYLENOL) suppository 650 mg  650 mg Rectal Q6H PRN Gouru, Aruna, MD      . alum & mag hydroxide-simeth (MAALOX/MYLANTA) 200-200-20 MG/5ML suspension 30 mL  30 mL Oral Q4H PRN Max Sane, MD   30 mL at 02/13/17 2152  . aspirin EC tablet 81 mg  81 mg Oral Daily Gouru, Aruna, MD   81 mg at 02/16/17 0859  . butalbital-acetaminophen-caffeine (FIORICET, ESGIC) 50-325-40 MG per tablet 1 tablet  1 tablet Oral Q4H PRN Max Sane, MD   1 tablet at 02/16/17 1115  . chlorhexidine (PERIDEX) 0.12 % solution 15 mL  15 mL Mouth Rinse BID Max Sane, MD   15 mL at 02/16/17 0857  . darifenacin (ENABLEX) 24 hr tablet 7.5 mg  7.5 mg Oral Daily Gouru, Aruna, MD   7.5 mg at 02/16/17 0857  . enoxaparin (LOVENOX) injection 40 mg  40 mg Subcutaneous Q24H Gouru,  Aruna, MD   40 mg at 02/15/17 2035  . gabapentin (NEURONTIN) capsule 400 mg  400 mg Oral QHS Gouru, Aruna, MD   400 mg at 02/15/17 2034  . hydrALAZINE (APRESOLINE) injection 10 mg  10 mg Intravenous Q6H PRN Max Sane, MD   10 mg at 02/16/17 0656  . hydrALAZINE (APRESOLINE) tablet 50 mg  50 mg Oral Q6H Shah, Vipul, MD   50 mg at 02/16/17 1223  . hydrochlorothiazide (MICROZIDE) capsule 12.5 mg  12.5 mg Oral Daily Max Sane, MD   12.5 mg at 02/16/17 0859  . insulin aspart (novoLOG) injection 0-5 Units  0-5 Units Subcutaneous QHS Gouru, Aruna, MD      . insulin aspart (novoLOG) injection 0-9 Units  0-9 Units Subcutaneous TID WC Gouru, Aruna, MD   1 Units at 02/16/17 1224  . lisinopril (PRINIVIL,ZESTRIL) tablet 10 mg  10 mg Oral Daily Max Sane, MD   10 mg at 02/16/17 0858  . MEDLINE mouth rinse  15 mL Mouth Rinse q12n4p Max Sane, MD   15 mL at 02/16/17 1223  . metoCLOPramide (REGLAN) injection 10 mg  10 mg Intravenous Q12H PRN Hugelmeyer, Alexis, DO   10 mg at 02/15/17 2136  . metoprolol tartrate (LOPRESSOR) injection 5 mg  5 mg Intravenous Q6H PRN Gouru, Aruna, MD   5 mg at 02/16/17 0140  . metoprolol tartrate (LOPRESSOR) tablet 25 mg  25 mg Oral Daily Gouru, Aruna, MD   25 mg at 02/16/17 0858  . ondansetron (ZOFRAN) tablet 4 mg  4 mg Oral Q6H PRN Gouru, Aruna, MD       Or  . ondansetron (ZOFRAN) injection 4 mg  4 mg Intravenous Q6H PRN Gouru, Aruna, MD   4 mg at 02/13/17 1938  . pantoprazole (PROTONIX) injection 40 mg  40 mg Intravenous Q12H Gouru, Aruna, MD   40 mg at 02/16/17 0859  . polyethylene glycol (MIRALAX / GLYCOLAX) packet 17 g  17 g Oral Daily PRN Gouru, Aruna, MD      . simvastatin (ZOCOR) tablet 20 mg  20 mg Oral Daily Gouru, Aruna, MD   20 mg at 02/16/17 0858  . sucralfate (CARAFATE) 1 GM/10ML suspension 1 g  1 g Oral TID WC & HS Wilford Corner, MD   1 g at 02/16/17 1224  . traMADol (ULTRAM) tablet 50 mg  50 mg Oral Q6H PRN Gouru, Aruna, MD       Facility-Administered  Medications Ordered in  Other Encounters  Medication Dose Route Frequency Provider Last Rate Last Dose  . fentaNYL (SUBLIMAZE) injection    Anesthesia Intra-op [place] [facility], Jane Canary, MD   50 mcg at 04/07/16 0752  . midazolam (VERSED) injection    Anesthesia Intra-op [place] [facility], Jane Canary, MD   1 mg at 04/07/16 0754  . propofol (DIPRIVAN) 10 mg/mL bolus/IV push    Anesthesia Intra-op [place] [facility], Jane Canary, MD   20 mg at 07/18/15 0840  . propofol (DIPRIVAN) 500 MG/50ML infusion    Continuous PRN [place] [facility], Jane Canary, MD 61.1 mL/hr at 07/18/15 0841 120 mcg/kg/min at 07/18/15 0841      Allergies: Allergies  Allergen Reactions  . Ciprofloxacin Nausea And Vomiting  . Hydrocodone Nausea Only  . Sertraline Nausea And Vomiting  . Codeine Nausea And Vomiting      Past Medical History: Past Medical History:  Diagnosis Date  . Anxiety   . Arthritis    hands  . Cancer ([place])   . CHF (congestive heart failure) (HCC)    swelling of feet and legs  . COPD (chronic obstructive pulmonary disease) (HCC)    O2 use at night 2 liters  . DDD (degenerative disc disease), cervical    cervical laminectomy x 2  . Deaf, right   . Depression   . Diabetes mellitus without complication ([place])   . GERD (gastroesophageal reflux disease)   . High cholesterol   . History of hiatal hernia   . Hypertension    controlled on meds  . Neuromuscular disorder (HCC)    neuropathy  . PONV (postoperative nausea and vomiting)   . Shortness of breath dyspnea   . Vertigo    last episode approx 1 yr ago  . Wears dentures    full upper and lower     Past Surgical History: Past Surgical History:  Procedure Laterality Date  . ABDOMINAL HYSTERECTOMY  1963  . APPENDECTOMY    . BACK SURGERY     cervical laminectomy x2  . CATARACT EXTRACTION W/PHACO Right 03/10/2016   Procedure: CATARACT EXTRACTION PHACO AND INTRAOCULAR LENS PLACEMENT (IOC);  Surgeon: Leandrew Koyanagi, MD;  Location: [facility];  Service: Ophthalmology;  Laterality: Right;  DIABETIC - oral meds  . CATARACT EXTRACTION W/PHACO Left 04/07/2016   Procedure: CATARACT EXTRACTION PHACO AND INTRAOCULAR LENS PLACEMENT (IOC);  Surgeon: Leandrew Koyanagi, MD;  Location: [facility];  Service: Ophthalmology;  Laterality: Left;  DIABETIC, oral med [place]    . ESOPHAGOGASTRODUODENOSCOPY N/A 07/18/2015   Procedure: ESOPHAGOGASTRODUODENOSCOPY (EGD);  Surgeon: Hulen Luster, MD;  Location: [facility] ENDOSCOPY;  Service: Gastroenterology;  Laterality: N/A;  . JOINT REPLACEMENT     hip / also revision  . SHOULDER SURGERY       Family History: No family history on file.   Social History: Social History   Social History  . Marital status: Widowed    Spouse name: N/A  . Number of children: N/A  . Years of education: N/A   Occupational History  . Not on file.   Social History Main Topics  . Smoking status: Former Smoker    Packs/day: 0.25    Years: 20.00    Types: Cigarettes    Quit date: 03/14/2000  . Smokeless tobacco: Never Used  . Alcohol use No  . Drug use: No  . Sexual activity: Not on file   Other Topics Concern  . Not on file   Social History Narrative  . No  narrative on file     Review of Systems: Review of Systems  Constitutional: Negative.  Negative for chills, diaphoresis, fever, malaise/fatigue and weight loss.  HENT: Negative.  Negative for congestion, ear discharge, ear pain, hearing loss, nosebleeds, sinus pain, sore throat and tinnitus.   Eyes: Negative.  Negative for blurred vision, double vision, photophobia, pain, discharge and redness.  Respiratory: Negative.  Negative for cough, hemoptysis, sputum production, shortness of breath, wheezing and stridor.   Cardiovascular: Negative.  Negative for chest pain, palpitations, orthopnea, claudication, leg swelling and PND.  Gastrointestinal: Positive for nausea and vomiting. Negative for abdominal pain, blood in  stool, constipation, diarrhea, heartburn and melena.  Genitourinary: Negative.  Negative for dysuria, flank pain, frequency, hematuria and urgency.  Musculoskeletal: Negative.  Negative for back pain, falls, joint pain, myalgias and neck pain.  Skin: Negative.  Negative for itching and rash.  Neurological: Negative.  Negative for dizziness, tingling, tremors, sensory change, speech change, focal weakness, seizures, loss of consciousness, weakness and headaches.  Endo/Heme/Allergies: Negative.  Negative for environmental allergies and polydipsia. Does not bruise/bleed easily.  Psychiatric/Behavioral: Negative.  Negative for depression, hallucinations, memory loss, substance abuse and suicidal ideas. The patient is not nervous/anxious and does not have insomnia.     Vital Signs: Blood pressure (!) 155/52, pulse 74, temperature (!) 97.3 F (36.3 C), temperature source Oral, resp. rate 20, height 5\' 3"  (1.6 m), weight 83.9 kg (185 lb), SpO2 94 %.  Weight trends: Filed Weights   02/13/17 1102  Weight: 83.9 kg (185 lb)    Physical Exam: General: NAD,   Head: Normocephalic, atraumatic. Moist oral mucosal membranes  Eyes: Anicteric, PERRL  Neck: Supple, trachea midline  Lungs:  Clear to auscultation  Heart: Regular rate and rhythm  Abdomen:  Soft, nontender,   Extremities:  peripheral edema.  Neurologic: Nonfocal, moving all four extremities  Skin: No lesions        Lab results: Basic Metabolic Panel:  Recent Labs Lab 02/13/17 1100 02/14/17 0347 02/15/17 0427  NA 137 140 140  K 4.1 4.1 4.1  CL 102 107 107  CO2 28 26 28   GLUCOSE 133* 108* 97  BUN 17 17 13   CREATININE 0.99 1.05* 1.01*  CALCIUM 9.6 9.3 9.1    Liver Function Tests:  Recent Labs Lab 02/13/17 1100 02/14/17 0347  AST 23 16  ALT 24 17  ALKPHOS 82 60  BILITOT 0.8 0.8  PROT 7.2 6.3*  ALBUMIN 4.0 3.6    Recent Labs Lab 02/13/17 1100  LIPASE 18   No results for input(s): AMMONIA in the last 168  hours.  CBC:  Recent Labs Lab 02/13/17 1100 02/14/17 0347 02/15/17 0427  WBC 13.0* 11.4* 9.3  HGB 15.3 13.6 13.5  HCT 46.9 41.1 40.6  MCV 86.9 86.2 86.4  PLT 237 210 187    Cardiac Enzymes: No results for input(s): CKTOTAL, CKMB, CKMBINDEX, TROPONINI in the last 168 hours.  BNP: Invalid input(s): POCBNP  CBG:  Recent Labs Lab 02/15/17 1136 02/15/17 1700 02/15/17 1949 02/16/17 0735 02/16/17 1140  GLUCAP 120* 120* 138* 75 133*    Microbiology: Results for orders placed or performed during the hospital encounter of 07/07/15  Blood culture (routine x 2)     Status: None   Collection Time: 07/07/15 11:36 AM  Result Value Ref Range Status   Specimen Description BLOOD LEFT ASSIST CONTROL  Final   Special Requests BOTTLES DRAWN AEROBIC AND ANAEROBIC [place]  Final   Culture NO GROWTH 5 DAYS  Final   Report Status 07/12/2015 FINAL  Final  Blood culture (routine x 2)     Status: None   Collection Time: 07/07/15 11:36 AM  Result Value Ref Range Status   Specimen Description BLOOD LEFT ASSIST CONTROL  Final   Special Requests BOTTLES DRAWN AEROBIC AND ANAEROBIC [place]  Final   Culture NO GROWTH 5 DAYS  Final   Report Status 07/12/2015 FINAL  Final    Coagulation Studies: No results for input(s): LABPROT, INR in the last 72 hours.  Urinalysis: No results for input(s): COLORURINE, LABSPEC, PHURINE, GLUCOSEU, HGBUR, BILIRUBINUR, KETONESUR, PROTEINUR, UROBILINOGEN, NITRITE, LEUKOCYTESUR in the last 72 hours.  Invalid input(s): APPERANCEUR    Imaging: Mr Jeri Cos EZ Contrast  Result Date: 02/15/2017 CLINICAL DATA:  Initial evaluation for chronic intractable headache, worsening. EXAM: MRI HEAD WITHOUT AND WITH CONTRAST TECHNIQUE: Multiplanar, multiecho pulse sequences of the brain and surrounding structures were obtained without and with intravenous contrast. CONTRAST:  17 cc MultiHance. COMPARISON:  Prior CT from 02/13/2017 as well as prior CT from  08/08/2016. Comparison also made with previous MRI from 06/20/2008. FINDINGS: Brain: Diffuse prominence of the CSF containing spaces compatible generalized age-related cerebral atrophy. Mild patchy T2/FLAIR hyperintensity within the periventricular and deep white matter both cerebral hemispheres most consistent with chronic small vessel ischemic disease, mild for age. No abnormal foci of restricted diffusion to suggest acute or subacute ischemia. No evidence for chronic infarction. No evidence for acute or chronic intracranial hemorrhage. No mass lesion, midline shift, or mass effect. Mild diffuse ventricular prominence related to global parenchymal volume loss of hydrocephalus. No extra-axial fluid collection para no abnormal enhancement. Incidental note made of an empty sella. Vascular: Major intracranial vascular flow voids maintained. Skull and upper cervical spine: Craniocervical junction within normal limits. Mild degenerative spondylolysis noted within the upper cervical spine without significant stenosis. Bone marrow signal intensity within normal limits. No scalp soft tissue abnormality. Sinuses/Orbits: Globes normal soft tissues within normal limits. Patient status post lens extraction bilaterally. Paranasal sinuses are clear. Small left greater than right mastoid effusions, of doubtful significance. Inner ear structures normal. Other: None. IMPRESSION: 1. No acute intracranial process identified. 2. Mild age-related cerebral atrophy with chronic small vessel ischemic disease, stable from recent CTs, but progressed relative to most recent MRI from 06/20/2008. Electronically Signed   By: Jeannine Boga M.D.   On: 02/15/2017 13:25      Assessment & Plan: [name] is a 83 y.o. white female with [name] is a 83 y.o. white female with hypertension, hyperlipidemia, diabetes mellitus type II, COPD, CHF, right renal artery stenosis, overactive bladder who was admitted to [facility] on  02/13/2017 for Intractable vomiting with nausea, unspecified vomiting type [R11.2]   1. Hypertension: Concern that elevated blood pressures are causing headaches and nausea/vomiting.  Blood pressure better controlled today. Home regimen of lisinopril 5mg  daily, metoprolol tartate 25mg  daily, furosemide 20mg  daily with reportedly decent control.  However with hypertensive urgency during this hospital admission.  Now changed to hydralazine, hydrochlorothiazide, lisinopril and metoprolol  - Will need outpatient follow up.   2. Right renal artery stenosis: renal dopplers on 11/12/2013 showed right stenosis at 60%.  - recommend medical management. Discussed case with vascular surgery.    LOS: 0 [name] 9/5/20182:36 PM

## 2017-02-17 LAB — BASIC METABOLIC PANEL
Anion gap: 6 (ref 5–15)
BUN: 17 mg/dL (ref 6–20)
CHLORIDE: 104 mmol/L (ref 101–111)
CO2: 29 mmol/L (ref 22–32)
CREATININE: 1.11 mg/dL — AB (ref 0.44–1.00)
Calcium: 9.3 mg/dL (ref 8.9–10.3)
GFR calc Af Amer: 52 mL/min — ABNORMAL LOW (ref >60)
GFR calc non Af Amer: 45 mL/min — ABNORMAL LOW (ref >60)
GLUCOSE: 105 mg/dL — AB (ref 65–99)
Potassium: 3.6 mmol/L (ref 3.5–5.1)
SODIUM: 139 mmol/L (ref 135–145)

## 2017-02-17 LAB — CBC
HCT: 41.6 % (ref 35.0–47.0)
HEMOGLOBIN: 14.3 g/dL (ref 12.0–16.0)
MCH: 29.6 pg (ref 26.0–34.0)
MCHC: 34.2 g/dL (ref 32.0–36.0)
MCV: 86.6 fL (ref 80.0–100.0)
Platelets: 185 10*3/uL (ref 150–440)
RBC: 4.81 MIL/uL (ref 3.80–5.20)
RDW: 15.6 % — ABNORMAL HIGH (ref 11.5–14.5)
WBC: 10.2 10*3/uL (ref 3.6–11.0)

## 2017-02-17 LAB — GLUCOSE, CAPILLARY: Glucose-Capillary: 90 mg/dL (ref 65–99)

## 2017-02-17 MED ORDER — GABAPENTIN 400 MG PO CAPS
400.0000 mg | ORAL_CAPSULE | Freq: Every day | ORAL | Status: DC
  Administered 2017-02-17: 02:00:00 400 mg via ORAL

## 2017-02-17 MED ORDER — HYDROCHLOROTHIAZIDE 12.5 MG PO CAPS: 12.5000 mg | ORAL_CAPSULE | Freq: Every day | ORAL | 0 refills | Status: DC

## 2017-02-17 MED ORDER — BUTALBITAL-APAP-CAFFEINE 50-325-40 MG PO TABS: 1.0000 | ORAL_TABLET | Freq: Four times a day (QID) | ORAL | 0 refills | Status: DC | PRN

## 2017-02-17 MED ORDER — GABAPENTIN 800 MG PO TABS
400.0000 mg | ORAL_TABLET | Freq: Every day | ORAL | Status: DC
  Filled 2017-02-17: qty 0.5

## 2017-02-17 MED ORDER — HYDRALAZINE HCL 50 MG PO TABS: 50.0000 mg | ORAL_TABLET | Freq: Four times a day (QID) | ORAL | 0 refills | Status: DC

## 2017-02-17 NOTE — Progress Notes (Signed)
Pt reports that she has been told by her PCP that she can take an additional Neurontin when she has burning of the feet.  Pt requesting additional Neurontin.  Pt also informed this nurse that she uses 2L of Oxygen at night.  Spoke with Dr Marcille Blanco and received order for additional dose of Neurontin and started oxygen at 2L [place]. Dorna Bloom RN

## 2017-02-17 NOTE — Progress Notes (Signed)
Medications administered by student RN [phone] with supervision of Clinical Instructor Lynann Bologna MSN, RN-BC.

## 2017-02-17 NOTE — Progress Notes (Signed)
Discharge Instructions reviewed with patient and daughter with patient's permission. Patient and daughter verbalized understanding. Dr. Juleen China initially present at discharge stated that he will see patient at this practice, daughter aware to follow up tomorrow regarding appointment. Patient still has a follow up appt with Dr. Holley Raring  If needed. Patient given Rx for Fioricet and other Rx's sent to designated pharmacy; family aware that patient should not be driving or operating heavy machinery- per daughter; patient's son lives with patient. Patient reminded to take all belongings with patient including clothing, jewelry and dentures ( both upper and lower- Daughter confirmed that dentures were in patient's bag at departure. Patient to be escorted to vehicle via wheelchair. Patient's daughter driving home.

## 2017-02-17 NOTE — Discharge Instructions (Signed)

## 2017-02-17 NOTE — Plan of Care (Signed)
Problem: Pain Managment: Goal: General experience of comfort will improve Outcome: Progressing Pt continues to c/o headache and is receiving Fioricet.  Also complains of burning to feet and received additional 400 mg dose of Neurontin with relief.   Problem: Physical Regulation: Goal: Ability to maintain clinical measurements within normal limits will improve Outcome: Progressing PRN dose of apresoline x 1 this shift.

## 2017-02-18 ENCOUNTER — Ambulatory Visit: Payer: Medicare Other

## 2017-02-18 LAB — ALDOSTERONE + RENIN ACTIVITY W/ RATIO
ALDO / PRA Ratio: <3.5 (ref 0.0–30.0)
PRA LC/MS/MS: 0.285 ng/mL/hr (ref 0.167–5.380)

## 2017-02-19 NOTE — Discharge Summary (Signed)
[facility] NAME: [name]    MR#:  [id number]  DATE OF BIRTH:  03-08-1933  DATE OF ADMISSION:  02/13/2017   ADMITTING PHYSICIAN: Nicholes Mango, MD  DATE OF DISCHARGE: 02/17/2017 11:30 AM  PRIMARY CARE PHYSICIAN: [facility]   ADMISSION DIAGNOSIS:  Intractable vomiting with nausea, unspecified vomiting type [R11.2] DISCHARGE DIAGNOSIS:  Active Problems:   Acute gastritis without bleeding   Hypertension   Renal artery stenosis ([place])  SECONDARY DIAGNOSIS:   Past Medical History:  Diagnosis Date  . Anxiety   . Arthritis    hands  . Cancer ([place])   . CHF (congestive heart failure) (HCC)    swelling of feet and legs  . COPD (chronic obstructive pulmonary disease) (HCC)    O2 use at night 2 liters  . DDD (degenerative disc disease), cervical    cervical laminectomy x 2  . Deaf, right   . Depression   . Diabetes mellitus without complication ([place])   . GERD (gastroesophageal reflux disease)   . High cholesterol   . History of hiatal hernia   . Hypertension    controlled on meds  . Neuromuscular disorder (HCC)    neuropathy  . PONV (postoperative nausea and vomiting)   . Shortness of breath dyspnea   . Vertigo    last episode approx 1 yr ago  . Wears dentures    full upper and lower   HOSPITAL COURSE:   [name] a 83 y.o. femalewith a known history of Congestive heart failure, COPD, type disorders, hypertension, mixed polyneuropathy sees Dr. Manuella Ghazi as an outpatient admitted with a chief complaint of intact to nausea and vomiting. Patient also reporting her-chronic headache was getting worse   #Uncontrolled resistant hypertension:  - somewhat improved with adjustment in meds - work up fro secondary HTN in progress and to be further evaluated as an outpt with Nephro office (Dr Juleen China)  # Acute gastritis with intractable nausea and vomiting - Resolved - tolerating diet  #Acute on chronic  Intractable headache 1. Patient needs a sleep study to evaluate for OSA vs hypoxemia given her daily morning headaches. 2. Uncontrolled HTN may be a contributing factor, needs management 3. F/u with Dr. Manuella Ghazi outpatient - MRI brain is negative for any acute patho -May benefit from overnight oximetry and/sleep studies as an outpatient - Fioricet twice daily for now - Neuro has Discussed: To prevent or relieve headaches, try the following:  Cool Compress. Lie down and place a cool compress on your head.   Avoid headache triggers. If certain foods or odors seem to have triggered your migraines in the past, avoid them. A headache diary might help you identify triggers.   Include physical activity in your daily routine. Try a daily walk or other moderate aerobic exercise.   Manage stress. Find healthy ways to cope with the stressors, such as delegating tasks on your to-do list.   Practice relaxation techniques. Try deep breathing, yoga, massage and visualization.   Eat regularly. Eating regularly scheduled meals and maintaining a healthy diet might help prevent headaches. Also, drink plenty of fluids.   Follow a regular sleep schedule. Sleep deprivation might contribute to headaches  Consider biofeedback. With this mind-body technique, you learn to control certain bodily functions - such as muscle tension, heart rate and blood pressure - to prevent headaches or reduce headache pain.   #Chronic diastolic congestive heart failure - well compensated at this time  DISCHARGE  CONDITIONS:  stable CONSULTS OBTAINED:  Treatment Team:  Alexis Goodell, MD Jonathon Bellows, MD DRUG ALLERGIES:   Allergies  Allergen Reactions  . Ciprofloxacin Nausea And Vomiting  . Hydrocodone Nausea Only  . Sertraline Nausea And Vomiting  . Codeine Nausea And Vomiting   DISCHARGE MEDICATIONS:   Allergies as of 02/17/2017      Reactions   Ciprofloxacin Nausea And Vomiting   Hydrocodone Nausea Only    Sertraline Nausea And Vomiting   Codeine Nausea And Vomiting      Medication List    TAKE these medications   aspirin EC 81 MG tablet Take 81 mg by mouth daily. breakfast   butalbital-acetaminophen-caffeine 50-325-40 MG tablet Commonly known as:  FIORICET, ESGIC Take 1 tablet by mouth every 6 (six) hours as needed for headache or migraine. Notes to patient:  Last dose was given last night   cyanocobalamin 1000 MCG tablet Take 1,000 mcg by mouth daily. breakfast   furosemide 20 MG tablet Commonly known as:  LASIX Take 20 mg by mouth daily. May take second dose if needed for swelling.   gabapentin 400 MG capsule Commonly known as:  NEURONTIN Take 1 capsule (400 mg total) by mouth at bedtime.   glipiZIDE 2.5 MG 24 hr tablet Commonly known as:  GLUCOTROL XL Take 2.5 mg by mouth daily with breakfast.   glucose blood test strip 1 each by Other route as needed for other. Use as instructed   hydrALAZINE 50 MG tablet Commonly known as:  APRESOLINE Take 1 tablet (50 mg total) by mouth every 6 (six) hours.   hydrochlorothiazide 12.5 MG capsule Commonly known as:  MICROZIDE Take 1 capsule (12.5 mg total) by mouth daily.   lisinopril 5 MG tablet Commonly known as:  PRINIVIL,ZESTRIL Take 1 tablet (5 mg total) by mouth daily.   metoprolol tartrate 25 MG tablet Commonly known as:  LOPRESSOR Take 25 mg by mouth daily. breakfast   ondansetron 8 MG disintegrating tablet Commonly known as:  ZOFRAN ODT Take 1 tablet (8 mg total) by mouth every 8 (eight) hours as needed for nausea or vomiting.   OXYGEN Inhale 2 L into the lungs as needed (at night and as needed).   pantoprazole 40 MG tablet Commonly known as:  PROTONIX Take 40 mg by mouth daily.   simvastatin 20 MG tablet Commonly known as:  ZOCOR Take 20 mg by mouth daily. breakfast   solifenacin 10 MG tablet Commonly known as:  VESICARE Take 10 mg by mouth daily. breakfast   traMADol 50 MG tablet Commonly known as:   ULTRAM Take 1 tablet (50 mg total) by mouth every 6 (six) hours as needed.            Discharge Care Instructions        Start     Ordered   02/17/17 0000  hydrALAZINE (APRESOLINE) 50 MG tablet  Every 6 hours     02/17/17 0849   02/17/17 0000  hydrochlorothiazide (MICROZIDE) 12.5 MG capsule  Daily     02/17/17 0849   02/17/17 0000  butalbital-acetaminophen-caffeine (FIORICET, ESGIC) 50-325-40 MG tablet  Every 6 hours PRN     02/17/17 0849   02/17/17 0000  Increase activity slowly     02/17/17 0849   02/17/17 0000  Diet - low sodium heart healthy     02/17/17 0849       DISCHARGE INSTRUCTIONS:   DIET:  Regular diet DISCHARGE CONDITION:  Good ACTIVITY:  Activity as tolerated OXYGEN:  Home Oxygen: No.  Oxygen Delivery: room air DISCHARGE LOCATION:  home   If you experience worsening of your admission symptoms, develop shortness of breath, life threatening emergency, suicidal or homicidal thoughts you must seek medical attention immediately by calling 911 or calling your MD immediately  if symptoms less severe.  You Must read complete instructions/literature along with all the possible adverse reactions/side effects for all the Medicines you take and that have been prescribed to you. Take any new Medicines after you have completely understood and accpet all the possible adverse reactions/side effects.   Please note  You were cared for by a hospitalist during your hospital stay. If you have any questions about your discharge medications or the care you received while you were in the hospital after you are discharged, you can call the unit and asked to speak with the hospitalist on call if the hospitalist that took care of you is not available. Once you are discharged, your primary care physician will handle any further medical issues. Please note that NO REFILLS for any discharge medications will be authorized once you are discharged, as it is imperative that you return to  your primary care physician (or establish a relationship with a primary care physician if you do not have one) for your aftercare needs so that they can reassess your need for medications and monitor your lab values.    On the day of Discharge:  VITAL SIGNS:  Blood pressure (!) 178/57, pulse 69, temperature 97.9 F (36.6 C), temperature source Oral, resp. rate 16, height 5\' 3"  (1.6 m), weight 69 kg (152 lb 3.2 oz), SpO2 100 %. PHYSICAL EXAMINATION:  GENERAL:  83 y.o.-year-old patient lying in the bed with no acute distress.  EYES: Pupils equal, round, reactive to light and accommodation. No scleral icterus. Extraocular muscles intact.  HEENT: Head atraumatic, normocephalic. Oropharynx and nasopharynx clear.  NECK:  Supple, no jugular venous distention. No thyroid enlargement, no tenderness.  LUNGS: Normal breath sounds bilaterally, no wheezing, rales,rhonchi or crepitation. No use of accessory muscles of respiration.  CARDIOVASCULAR: S1, S2 normal. No murmurs, rubs, or gallops.  ABDOMEN: Soft, non-tender, non-distended. Bowel sounds present. No organomegaly or mass.  EXTREMITIES: No pedal edema, cyanosis, or clubbing.  NEUROLOGIC: Cranial nerves II through XII are intact. Muscle strength 5/5 in all extremities. Sensation intact. Gait not checked.  PSYCHIATRIC: The patient is alert and oriented x 3.  SKIN: No obvious rash, lesion, or ulcer.  DATA REVIEW:   CBC  Recent Labs Lab 02/17/17 0608  WBC 10.2  HGB 14.3  HCT 41.6  PLT 185    Chemistries   Recent Labs Lab 02/14/17 0347  02/17/17 0608  NA 140  < > 139  K 4.1  < > 3.6  CL 107  < > 104  CO2 26  < > 29  GLUCOSE 108*  < > 105*  BUN 17  < > 17  CREATININE 1.05*  < > 1.11*  CALCIUM 9.3  < > 9.3  AST 16  --   --   ALT 17  --   --   ALKPHOS 60  --   --   BILITOT 0.8  --   --   < > = values in this interval not displayed.   Follow-up Information    Vladimir Crofts, MD. Go on 02/21/2017.   Specialty:  Neurology Why:   @2 :30 PM Contact information: [facility] [place] [address] [phone]  [facility]. Go on 02/25/2017.   Why:  at 11:45 a.m. Contact information: [place] [address] [phone]        Jonathon Bellows, MD. Schedule an appointment as soon as possible for a visit in 2 week(s).   Specialty:  Surgery Contact information: [place] [address] [phone]        Erby Pian, MD. Schedule an appointment as soon as possible for a visit in 2 week(s).   Specialty:  Specialist Why:  sleep study Contact information: [place] [address] [phone]        Anthonette Legato, MD Follow up on 03/07/2017.   Specialty:  Internal Medicine Why:  at 9:30 a.m. Contact information: [place] [address] [phone]        Lavonia Dana, MD. Go on 02/23/2017.   Specialty:  Internal Medicine Why:  3:20pm Contact information: [address] [place] [address] [phone]           Management plans discussed with the patient, family and they are in agreement.  CODE STATUS: Prior   TOTAL TIME TAKING CARE OF THIS PATIENT: 45 minutes.    Max Sane M.D on 02/19/2017 at 2:17 PM  Between 7am to 6pm - Pager - [phone]  After 6pm go to [email or web address] - Proofreader  [facility]  Office  [phone]  CC: Primary care physician; Katheren Shams   Note: This dictation was prepared with Dragon dictation along with smaller phrase technology. Any transcriptional errors that result from this process are unintentional.

## 2017-03-18 ENCOUNTER — Encounter (INDEPENDENT_AMBULATORY_CARE_PROVIDER_SITE_OTHER): Payer: Self-pay | Admitting: Vascular Surgery

## 2017-04-18 ENCOUNTER — Emergency Department: Payer: Medicare Other

## 2017-04-18 ENCOUNTER — Other Ambulatory Visit: Payer: Self-pay

## 2017-04-18 ENCOUNTER — Inpatient Hospital Stay
Admission: EM | Admit: 2017-04-18 | Discharge: 2017-04-19 | DRG: 291 | Disposition: A | Discharge status: Home / Self Care | Payer: Medicare Other | Attending: Internal Medicine | Admitting: Internal Medicine

## 2017-04-18 DIAGNOSIS — N179 Acute kidney failure, unspecified: Secondary | ICD-10-CM | POA: Diagnosis not present

## 2017-04-18 DIAGNOSIS — Z87891 Personal history of nicotine dependence: Secondary | ICD-10-CM

## 2017-04-18 DIAGNOSIS — Z9981 Dependence on supplemental oxygen: Secondary | ICD-10-CM | POA: Diagnosis not present

## 2017-04-18 DIAGNOSIS — Z7982 Long term (current) use of aspirin: Secondary | ICD-10-CM | POA: Diagnosis not present

## 2017-04-18 DIAGNOSIS — H9191 Unspecified hearing loss, right ear: Secondary | ICD-10-CM | POA: Diagnosis present

## 2017-04-18 DIAGNOSIS — I5033 Acute on chronic diastolic (congestive) heart failure: Secondary | ICD-10-CM | POA: Diagnosis not present

## 2017-04-18 DIAGNOSIS — K219 Gastro-esophageal reflux disease without esophagitis: Secondary | ICD-10-CM | POA: Diagnosis present

## 2017-04-18 DIAGNOSIS — J8410 Pulmonary fibrosis, unspecified: Secondary | ICD-10-CM | POA: Diagnosis present

## 2017-04-18 DIAGNOSIS — I951 Orthostatic hypotension: Secondary | ICD-10-CM | POA: Diagnosis present

## 2017-04-18 DIAGNOSIS — E119 Type 2 diabetes mellitus without complications: Secondary | ICD-10-CM | POA: Diagnosis present

## 2017-04-18 DIAGNOSIS — Z79899 Other long term (current) drug therapy: Secondary | ICD-10-CM | POA: Diagnosis not present

## 2017-04-18 DIAGNOSIS — I509 Heart failure, unspecified: Secondary | ICD-10-CM

## 2017-04-18 DIAGNOSIS — E7800 Pure hypercholesterolemia, unspecified: Secondary | ICD-10-CM | POA: Diagnosis present

## 2017-04-18 DIAGNOSIS — F419 Anxiety disorder, unspecified: Secondary | ICD-10-CM | POA: Diagnosis not present

## 2017-04-18 DIAGNOSIS — J810 Acute pulmonary edema: Secondary | ICD-10-CM

## 2017-04-18 DIAGNOSIS — I110 Hypertensive heart disease with heart failure: Principal | ICD-10-CM | POA: Diagnosis present

## 2017-04-18 DIAGNOSIS — J449 Chronic obstructive pulmonary disease, unspecified: Secondary | ICD-10-CM | POA: Diagnosis not present

## 2017-04-18 DIAGNOSIS — R531 Weakness: Secondary | ICD-10-CM | POA: Diagnosis present

## 2017-04-18 DIAGNOSIS — F329 Major depressive disorder, single episode, unspecified: Secondary | ICD-10-CM | POA: Diagnosis present

## 2017-04-18 DIAGNOSIS — J9601 Acute respiratory failure with hypoxia: Secondary | ICD-10-CM

## 2017-04-18 DIAGNOSIS — R55 Syncope and collapse: Secondary | ICD-10-CM

## 2017-04-18 LAB — URINALYSIS, COMPLETE (UACMP) WITH MICROSCOPIC
Bacteria, UA: NONE SEEN
Bilirubin Urine: NEGATIVE
Glucose, UA: NEGATIVE mg/dL
Hgb urine dipstick: NEGATIVE
KETONES UR: NEGATIVE mg/dL
Leukocytes, UA: NEGATIVE
Nitrite: NEGATIVE
PH: 6 (ref 5.0–8.0)
Protein, ur: NEGATIVE mg/dL
SQUAMOUS EPITHELIAL / LPF: NONE SEEN
Specific Gravity, Urine: 1.012 (ref 1.005–1.030)

## 2017-04-18 LAB — GLUCOSE, CAPILLARY
GLUCOSE-CAPILLARY: 130 mg/dL — AB (ref 65–99)
Glucose-Capillary: 99 mg/dL (ref 65–99)

## 2017-04-18 LAB — BASIC METABOLIC PANEL
Anion gap: 10 (ref 5–15)
Anion gap: 7 (ref 5–15)
BUN: 28 mg/dL — ABNORMAL HIGH (ref 6–20)
BUN: 26 mg/dL — AB (ref 6–20)
CALCIUM: 9.7 mg/dL (ref 8.9–10.3)
CO2: 27 mmol/L (ref 22–32)
CO2: 28 mmol/L (ref 22–32)
CREATININE: 1.55 mg/dL — AB (ref 0.44–1.00)
CREATININE: 1.4 mg/dL — AB (ref 0.44–1.00)
Calcium: 9.7 mg/dL (ref 8.9–10.3)
Chloride: 101 mmol/L (ref 101–111)
Chloride: 104 mmol/L (ref 101–111)
GFR calc Af Amer: 35 mL/min — ABNORMAL LOW (ref >60)
GFR calc non Af Amer: 30 mL/min — ABNORMAL LOW (ref >60)
GFR calc non Af Amer: 34 mL/min — ABNORMAL LOW (ref >60)
GFR, EST AFRICAN AMERICAN: 39 mL/min — AB (ref >60)
GLUCOSE: 124 mg/dL — AB (ref 65–99)
Glucose, Bld: 107 mg/dL — ABNORMAL HIGH (ref 65–99)
Potassium: 4.4 mmol/L (ref 3.5–5.1)
Potassium: 4.5 mmol/L (ref 3.5–5.1)
SODIUM: 139 mmol/L (ref 135–145)
Sodium: 138 mmol/L (ref 135–145)

## 2017-04-18 LAB — CBC
HCT: 42.5 % (ref 35.0–47.0)
HEMOGLOBIN: 13.9 g/dL (ref 12.0–16.0)
MCH: 28.9 pg (ref 26.0–34.0)
MCHC: 32.6 g/dL (ref 32.0–36.0)
MCV: 88.7 fL (ref 80.0–100.0)
PLATELETS: 200 10*3/uL (ref 150–440)
RBC: 4.79 MIL/uL (ref 3.80–5.20)
RDW: 15.1 % — ABNORMAL HIGH (ref 11.5–14.5)
WBC: 10.6 10*3/uL (ref 3.6–11.0)

## 2017-04-18 MED ORDER — ACETAMINOPHEN 650 MG RE SUPP: 650.0000 mg | Freq: Four times a day (QID) | RECTAL | Status: DC | PRN

## 2017-04-18 MED ORDER — ONDANSETRON 8 MG PO TBDP
8.0000 mg | ORAL_TABLET | Freq: Three times a day (TID) | ORAL | Status: DC | PRN
  Filled 2017-04-18: qty 1

## 2017-04-18 MED ORDER — VITAMIN B-12 1000 MCG PO TABS
1000.0000 ug | ORAL_TABLET | Freq: Every day | ORAL | Status: DC
  Administered 2017-04-19: 1000 ug via ORAL
  Filled 2017-04-18: qty 1

## 2017-04-18 MED ORDER — SODIUM CHLORIDE 0.9 % IV BOLUS (SEPSIS)
500.0000 mL | Freq: Once | INTRAVENOUS | Status: AC
  Administered 2017-04-18: 500 mL via INTRAVENOUS

## 2017-04-18 MED ORDER — FUROSEMIDE 10 MG/ML IJ SOLN
20.0000 mg | Freq: Two times a day (BID) | INTRAMUSCULAR | Status: DC
  Administered 2017-04-18: 20 mg via INTRAVENOUS
  Filled 2017-04-18: qty 4

## 2017-04-18 MED ORDER — BUTALBITAL-APAP-CAFFEINE 50-325-40 MG PO TABS: 1.0000 | ORAL_TABLET | Freq: Four times a day (QID) | ORAL | Status: DC | PRN

## 2017-04-18 MED ORDER — GABAPENTIN 400 MG PO CAPS: 400.0000 mg | ORAL_CAPSULE | Freq: Every day | ORAL | Status: DC

## 2017-04-18 MED ORDER — SODIUM CHLORIDE 0.9% FLUSH
3.0000 mL | Freq: Two times a day (BID) | INTRAVENOUS | Status: DC
  Administered 2017-04-19 (×2): 3 mL via INTRAVENOUS

## 2017-04-18 MED ORDER — METOPROLOL SUCCINATE ER 25 MG PO TB24
25.0000 mg | ORAL_TABLET | Freq: Every day | ORAL | Status: DC
  Administered 2017-04-19: 25 mg via ORAL
  Filled 2017-04-18: qty 1

## 2017-04-18 MED ORDER — PANTOPRAZOLE SODIUM 40 MG PO TBEC
40.0000 mg | DELAYED_RELEASE_TABLET | Freq: Two times a day (BID) | ORAL | Status: DC
  Administered 2017-04-19: 40 mg via ORAL
  Filled 2017-04-18: qty 1

## 2017-04-18 MED ORDER — DARIFENACIN HYDROBROMIDE ER 7.5 MG PO TB24
7.5000 mg | ORAL_TABLET | Freq: Every day | ORAL | Status: DC
  Administered 2017-04-19: 7.5 mg via ORAL
  Filled 2017-04-18: qty 1

## 2017-04-18 MED ORDER — SIMVASTATIN 20 MG PO TABS: 20.0000 mg | ORAL_TABLET | Freq: Every day | ORAL | Status: DC

## 2017-04-18 MED ORDER — ONDANSETRON HCL 4 MG/2ML IJ SOLN: 4.0000 mg | Freq: Four times a day (QID) | INTRAMUSCULAR | Status: DC | PRN

## 2017-04-18 MED ORDER — INSULIN ASPART 100 UNIT/ML ~~LOC~~ SOLN
0.0000 [IU] | Freq: Three times a day (TID) | SUBCUTANEOUS | Status: DC
  Administered 2017-04-19: 1 [IU] via SUBCUTANEOUS
  Filled 2017-04-18: qty 1

## 2017-04-18 MED ORDER — ACETAMINOPHEN 325 MG PO TABS: 650.0000 mg | ORAL_TABLET | Freq: Four times a day (QID) | ORAL | Status: DC | PRN

## 2017-04-18 MED ORDER — ONDANSETRON HCL 4 MG PO TABS: 4.0000 mg | ORAL_TABLET | Freq: Four times a day (QID) | ORAL | Status: DC | PRN

## 2017-04-18 MED ORDER — SODIUM CHLORIDE 0.9 % IV SOLN: 250.0000 mL | INTRAVENOUS | Status: DC | PRN

## 2017-04-18 MED ORDER — SODIUM CHLORIDE 0.9% FLUSH: 3.0000 mL | INTRAVENOUS | Status: DC | PRN

## 2017-04-18 MED ORDER — TRAMADOL HCL 50 MG PO TABS: 50.0000 mg | ORAL_TABLET | Freq: Two times a day (BID) | ORAL | Status: DC | PRN

## 2017-04-18 MED ORDER — ENOXAPARIN SODIUM 30 MG/0.3ML ~~LOC~~ SOLN
30.0000 mg | SUBCUTANEOUS | Status: DC
  Administered 2017-04-19: 30 mg via SUBCUTANEOUS
  Filled 2017-04-18: qty 0.3

## 2017-04-18 MED ORDER — DULOXETINE HCL 20 MG PO CPEP
20.0000 mg | ORAL_CAPSULE | Freq: Every day | ORAL | Status: DC
  Administered 2017-04-19: 20 mg via ORAL
  Filled 2017-04-18: qty 1

## 2017-04-18 MED ORDER — ASPIRIN EC 81 MG PO TBEC
81.0000 mg | DELAYED_RELEASE_TABLET | Freq: Every day | ORAL | Status: DC
Start: 2017-04-19 — End: 2017-04-19
  Administered 2017-04-19: 81 mg via ORAL
  Filled 2017-04-18: qty 1

## 2017-04-18 NOTE — ED Notes (Signed)
Resumed care from stephanie rn.  Pt alert and up to br with assistance. Family with pt  Pt waiting on room assignment

## 2017-04-18 NOTE — ED Notes (Signed)
Report  Called to [facility].

## 2017-04-18 NOTE — ED Triage Notes (Signed)
Pt states she was not feeling well this morning with nausea and dizziness states her son found her passed out in a chair but able to wake her. Pt is a/ox4 upon arrival. States she remembers her son trying to get her to eat something. EMS reports pt hypotensive on arrival systolic in the 88'E and infused 500ML bolus of NS.

## 2017-04-18 NOTE — ED Notes (Signed)
fsbs 99   Pt alert.  Family with pt.  Skin warm and dry.  nsr on monitor.  Pt waiting on admission

## 2017-04-18 NOTE — ED Provider Notes (Signed)
[facility] Emergency Department Provider Note  ____________________________________________  Time seen: Approximately 11:01 AM  I have reviewed the triage vital signs and the nursing notes.   HISTORY  Chief Complaint Loss of Consciousness   HPI [name] is a 83 y.o. female a history of COPD, diabetes, hypertension, hyperlipidemia who presents for evaluation of generalized weakness. Patient reports that she had just walked to the table to have breakfast. Her son put the food in front of her and she was unable to respond or move. She felt very weak. She did not pass out. She remembers her son speaking with her and her being unable to respond. No facula droop and no unilateral weakness. This has never happened to her before. She denies a headache. When her daughter arrived less than 10 minutes later patient was already back to her baseline. Per EMS patient was hypotensive with systolics in the 43P. Patient has no complaints at this time, no headache, no chest pain, no shortness of breath, no fever or chills, no abdominal pain, no nausea, no vomiting, no diarrhea. Patient is back to her baseline.  Past Medical History:  Diagnosis Date  . Anxiety   . Arthritis    hands  . Cancer ([place])   . CHF (congestive heart failure) (HCC)    swelling of feet and legs  . COPD (chronic obstructive pulmonary disease) (HCC)    O2 use at night 2 liters  . DDD (degenerative disc disease), cervical    cervical laminectomy x 2  . Deaf, right   . Depression   . Diabetes mellitus without complication ([place])   . GERD (gastroesophageal reflux disease)   . High cholesterol   . History of hiatal hernia   . Hypertension    controlled on meds  . Neuromuscular disorder (HCC)    neuropathy  . PONV (postoperative nausea and vomiting)   . Shortness of breath dyspnea   . Vertigo    last episode approx 1 yr ago  . Wears dentures    full upper and lower    Patient Active  Problem List   Diagnosis Date Noted  . Hypertension 02/16/2017  . Renal artery stenosis ([place]) 02/16/2017  . Acute gastritis without bleeding 02/13/2017    Past Surgical History:  Procedure Laterality Date  . ABDOMINAL HYSTERECTOMY  1963  . APPENDECTOMY    . BACK SURGERY     cervical laminectomy x2  . CHOLECYSTECTOMY    . JOINT REPLACEMENT     hip / also revision  . SHOULDER SURGERY      Prior to Admission medications   Medication Sig Start Date End Date Taking? Authorizing Provider  aspirin EC 81 MG tablet Take 81 mg by mouth daily. breakfast   Yes [provider]  clobetasol cream (TEMOVATE) 2.95 % Apply 1 application as needed topically.   Yes [provider]  cyanocobalamin 1000 MCG tablet Take 1,000 mcg by mouth daily. breakfast   Yes [provider]  DULoxetine (CYMBALTA) 20 MG capsule Take 20 mg daily by mouth.   Yes [provider]  furosemide (LASIX) 20 MG tablet Take 20 mg by mouth daily. May take second dose if needed for swelling.   Yes [provider]  gabapentin (NEURONTIN) 400 MG capsule Take 1 capsule (400 mg total) by mouth at bedtime. Patient taking differently: Take 800 mg at bedtime by mouth.  03/15/13  Yes RegalTamala Fothergill, DPM  hydrALAZINE (APRESOLINE) 50 MG tablet Take 1  tablet (50 mg total) by mouth every 6 (six) hours. Patient taking differently: Take 50 mg daily by mouth.  02/17/17  Yes Max Sane, MD  lisinopril (PRINIVIL,ZESTRIL) 5 MG tablet Take 1 tablet (5 mg total) by mouth daily. 08/08/16 08/08/17 Yes Delman Kitten, MD  metoprolol succinate (TOPROL-XL) 25 MG 24 hr tablet Take 25 mg daily by mouth.   Yes [provider]  metoprolol tartrate (LOPRESSOR) 25 MG tablet Take 25 mg by mouth daily. breakfast   Yes [provider]  pantoprazole (PROTONIX) 40 MG tablet Take 40 mg 2 (two) times daily by mouth.    Yes [provider]  simvastatin (ZOCOR) 20 MG tablet Take 20 mg by mouth daily.  breakfast   Yes [provider]  solifenacin (VESICARE) 10 MG tablet Take 10 mg by mouth daily. breakfast   Yes [provider]  traMADol (ULTRAM) 50 MG tablet Take every 6 (six) hours as needed by mouth.   Yes [provider]  butalbital-acetaminophen-caffeine (FIORICET, ESGIC) 50-325-40 MG tablet Take 1 tablet by mouth every 6 (six) hours as needed for headache or migraine. Patient not taking: Reported on 04/18/2017 02/17/17   Max Sane, MD  glucose blood test strip 1 each by Other route as needed for other. Use as instructed    [provider]  hydrochlorothiazide (MICROZIDE) 12.5 MG capsule Take 1 capsule (12.5 mg total) by mouth daily. Patient not taking: Reported on 04/18/2017 02/17/17   Max Sane, MD  ondansetron (ZOFRAN ODT) 8 MG disintegrating tablet Take 1 tablet (8 mg total) by mouth every 8 (eight) hours as needed for nausea or vomiting. Patient not taking: Reported on 04/01/2016 07/07/15   Carrie Mew, MD  OXYGEN Inhale 2 L into the lungs as needed (at night and as needed).    [provider]    Allergies Ciprofloxacin; Hydrocodone; Sertraline; and Codeine  No family history on file.  Social History Social History   Tobacco Use  . Smoking status: Former Smoker    Packs/day: 0.25    Years: 20.00    Pack years: 5.00    Types: Cigarettes    Last attempt to quit: 03/14/2000    Years since quitting: 17.1  . Smokeless tobacco: Never Used  Substance Use Topics  . Alcohol use: No  . Drug use: No    Review of Systems  Constitutional: Negative for fever. + generalized weakness Eyes: Negative for visual changes. ENT: Negative for sore throat. Neck: No neck pain  Cardiovascular: Negative for chest pain. Respiratory: Negative for shortness of breath. Gastrointestinal: Negative for abdominal pain, vomiting or diarrhea. Genitourinary: Negative for dysuria. Musculoskeletal: Negative for back pain. Skin: Negative for  rash. Neurological: Negative for headaches, weakness or numbness. Psych: No SI or HI  ____________________________________________   PHYSICAL EXAM:  VITAL SIGNS: ED Triage Vitals  Enc Vitals Group     BP 04/18/17 0924 (!) 148/52     Pulse Rate 04/18/17 0917 61     Resp 04/18/17 0917 18     Temp 04/18/17 0924 97.7 F (36.5 C)     Temp Source 04/18/17 0917 Oral     SpO2 04/18/17 0917 (!) 88 %     Weight 04/18/17 0918 175 lb (79.4 kg)     Height 04/18/17 0918 5\' 2"  (1.575 m)     Head Circumference --      Peak Flow --      Pain Score --      Pain Loc --  Pain Edu? --      Excl. in [place]? --     Constitutional: Alert and oriented. Well appearing and in no apparent distress. HEENT:      Head: Normocephalic and atraumatic.         Eyes: Conjunctivae are normal. Sclera is non-icteric.       Mouth/Throat: Mucous membranes are moist.       Neck: Supple with no signs of meningismus. Cardiovascular: Regular rate and rhythm. No murmurs, gallops, or rubs. 2+ symmetrical distal pulses are present in all extremities. No JVD. Respiratory: Normal respiratory effort. Lungs are clear to auscultation bilaterally with crackles on bilateral bases.  Gastrointestinal: Soft, non tender, and non distended with positive bowel sounds. No rebound or guarding. Musculoskeletal: Nontender with normal range of motion in all extremities. No edema, cyanosis, or erythema of extremities. Neurologic: Normal speech and language. A & O x3, PERRL, no nystagmus, CN II-XII intact, motor testing reveals good tone and bulk throughout. There is no evidence of pronator drift or dysmetria. Muscle strength is 5/5 throughout.  Sensory examination is intact. Gait deferred. Skin: Skin is warm, dry and intact. No rash noted. Psychiatric: Mood and affect are normal. Speech and behavior are normal.  ____________________________________________   LABS (all labs ordered are listed, but only abnormal results are  displayed)  Labs Reviewed  BASIC METABOLIC PANEL - Abnormal; Notable for the following components:      Result Value   Glucose, Bld 124 (*)    BUN 28 (*)    Creatinine, Ser 1.55 (*)    GFR calc non Af Amer 30 (*)    GFR calc Af Amer 35 (*)    All other components within normal limits  CBC - Abnormal; Notable for the following components:   RDW 15.1 (*)    All other components within normal limits  URINALYSIS, COMPLETE (UACMP) WITH MICROSCOPIC - Abnormal; Notable for the following components:   Color, Urine YELLOW (*)    APPearance CLEAR (*)    All other components within normal limits  GLUCOSE, CAPILLARY - Abnormal; Notable for the following components:   Glucose-Capillary 130 (*)    All other components within normal limits  BASIC METABOLIC PANEL - Abnormal; Notable for the following components:   Glucose, Bld 107 (*)    BUN 26 (*)    Creatinine, Ser 1.40 (*)    GFR calc non Af Amer 34 (*)    GFR calc Af Amer 39 (*)    All other components within normal limits  CBG MONITORING, ED   ____________________________________________  EKG  ED ECG REPORT I, Rudene Re, the attending physician, personally viewed and interpreted this ECG.  Normal sinus rhythm, rate of 60, normal intervals, normal axis, no ST elevations or depressions,.  ____________________________________________  RADIOLOGY  CXR: 1. Increase in diffuse bilateral reticular interstitial opacities worrisome for progressed chronic interstitial lung disease. Consider further evaluation with high-resolution CT of the chest. 2. Aortic Atherosclerosis (ICD10-I70.0).   CT chest: 1. Spectrum of findings most suggestive of pulmonary edema due to congestive heart failure superimposed on chronic basilar predominant fibrotic interstitial lung disease. 2. The absence of frank honeycombing and the absence of significant progression of the bronchiectasis since the 09/15/2011 chest CT study is not compatible with  usual interstitial pneumonia (UIP), and the suspected underlying interstitial lung disease is most compatible with fibrotic phase nonspecific interstitial pneumonia (NSIP). 3. Mild cardiomegaly. One vessel coronary atherosclerosis. Trace pericardial effusion/thickening. 4. Mild mediastinal lymphadenopathy, stable to minimally  increased since 2013, most suggestive of benign reactive adenopathy. 5. Stable mild multinodular goiter. ____________________________________________   PROCEDURES  Procedure(s) performed: None Procedures Critical Care performed: yes  CRITICAL CARE Performed by: Rudene Re  ?  Total critical care time: 35 min  Critical care time was exclusive of separately billable procedures and treating other patients.  Critical care was necessary to treat or prevent imminent or life-threatening deterioration.  Critical care was time spent personally by me on the following activities: development of treatment plan with patient and/or surrogate as well as nursing, discussions with consultants, evaluation of patient's response to treatment, examination of patient, obtaining history from patient or surrogate, ordering and performing treatments and interventions, ordering and review of laboratory studies, ordering and review of radiographic studies, pulse oximetry and re-evaluation of patient's condition.  ____________________________________________   INITIAL IMPRESSION / ASSESSMENT AND PLAN / ED COURSE   83 y.o. female a history of COPD, diabetes, hypertension, hyperlipidemia who presents for evaluation of generalized weakness with prompt a return to baseline. Patient patient description it sounds like she had a near syncopal episode especially after EMS found patient to be hypotensive. Patient is completely neurologically intact and did not have any focal neuro deficits per family or EMS. She does have AKI at this time and is orthostatic on exam. Will give IVF. Patient  was noted to be hypoxic on arrival however during my evaluation I was able to remove nasal cannula and patient maintained her sats 99-100%. Patient then became hypoxic again and was placed on 3L [place].  She does have faint crackles bilaterally with CXR read as worsening interstitial lung disease, patient has no h/o ILD. She has no cough or fever and no signs of pneumonia at this time. She is euvolemic. Radiologist recommended CT chest which I will order at this time.    _________________________ 2:08 PM on 04/18/2017 ----------------------------------------- CT concerning for pulmonary edema and congestive heart failure and ILD which are all new diagnosis for patient. Patient continues to require 3 L nasal cannula. Also with AKI and orthostatic. Patient received IVF in the ED and will need gentle diureses to avoid worsening kidney function therefore will admit to [facility]    As part of my medical decision making, I reviewed the following data within the [place] History obtained from family, Nursing notes reviewed and incorporated, EKG interpreted , Old EKG reviewed, Radiograph reviewed , Discussed with admitting physician , Notes from prior ED visits and [place] Controlled Substance Database    Pertinent labs & imaging results that were available during my care of the patient were reviewed by me and considered in my medical decision making (see chart for details).    ____________________________________________   FINAL CLINICAL IMPRESSION(S) / ED DIAGNOSES  Final diagnoses:  Near syncope  AKI (acute kidney injury) ([place])  Acute pulmonary edema (HCC)  Orthostasis  Acute respiratory failure with hypoxia (HCC)      NEW MEDICATIONS STARTED DURING THIS VISIT:  This SmartLink is deprecated. Use AVSMEDLIST instead to display the medication list for a patient.   Note:  This document was prepared using Dragon voice recognition software and may include unintentional dictation  errors.    Rudene Re, MD 04/18/17 [phone]

## 2017-04-18 NOTE — ED Notes (Signed)
Pt alert.  Family at bedside.  Continues to wait of bed assignment

## 2017-04-18 NOTE — H&P (Signed)
[facility] [place] NAME: Virginie Josten    MR#:  [id number]  DATE OF BIRTH:  06/04/1933  DATE OF ADMISSION:  04/18/2017  PRIMARY CARE PHYSICIAN: Katheren Shams   REQUESTING/REFERRING PHYSICIAN:   CHIEF COMPLAINT:   Chief Complaint  Patient presents with  . Loss of Consciousness    HISTORY OF PRESENT ILLNESS: [name]  is a 83 y.o. female with a known history of congestive heart failure in the past, recent admission for accelerated hypertension, COPD, oxygen use at nighttime and as needed during the day, diabetes type 2 not on any medications who is presenting to the ED with complaining of feeling very weak and tired this morning.  She could not get up and walk because she was so weak.  When EMS got there her blood pressure was in the 60s.  Patient in the emergency room was noted to have hypoxia and low oxygen.  Therefore she had a CT scan of the chest which showed findings consistent with pulmonary fibrosis as well as acute CHF.  Patient now is requiring 3 L of oxygen.  She states that she has some swelling of the legs but denies any chest pain . She reports that her shortness of breath is at baseline.   PAST MEDICAL HISTORY:   Past Medical History:  Diagnosis Date  . Anxiety   . Arthritis    hands  . Cancer ([place])   . CHF (congestive heart failure) (HCC)    swelling of feet and legs  . COPD (chronic obstructive pulmonary disease) (HCC)    O2 use at night 2 liters  . DDD (degenerative disc disease), cervical    cervical laminectomy x 2  . Deaf, right   . Depression   . Diabetes mellitus without complication ([place])   . GERD (gastroesophageal reflux disease)   . High cholesterol   . History of hiatal hernia   . Hypertension    controlled on meds  . Neuromuscular disorder (HCC)    neuropathy  . PONV (postoperative nausea and vomiting)   . Shortness of breath dyspnea   . Vertigo    last episode approx 1 yr ago  . Wears  dentures    full upper and lower    PAST SURGICAL HISTORY:  Past Surgical History:  Procedure Laterality Date  . ABDOMINAL HYSTERECTOMY  1963  . APPENDECTOMY    . BACK SURGERY     cervical laminectomy x2  . CHOLECYSTECTOMY    . JOINT REPLACEMENT     hip / also revision  . SHOULDER SURGERY      SOCIAL HISTORY:  Social History   Tobacco Use  . Smoking status: Former Smoker    Packs/day: 0.25    Years: 20.00    Pack years: 5.00    Types: Cigarettes    Last attempt to quit: 03/14/2000    Years since quitting: 17.1  . Smokeless tobacco: Never Used  Substance Use Topics  . Alcohol use: No    FAMILY HISTORY: No family history on file.  DRUG ALLERGIES:  Allergies  Allergen Reactions  . Ciprofloxacin Nausea And Vomiting  . Hydrocodone Nausea Only  . Sertraline Nausea And Vomiting  . Codeine Nausea And Vomiting    REVIEW OF SYSTEMS:   CONSTITUTIONAL: No fever, positive fatigue and weakness.  EYES: No blurred or double vision.  EARS, NOSE, AND THROAT: No tinnitus or ear pain.  RESPIRATORY: No cough, positive shortness of breath, as needed  wheezing or no hemoptysis.  CARDIOVASCULAR: No chest pain, orthopnea, positive edema.  GASTROINTESTINAL: No nausea, vomiting, diarrhea or abdominal pain.  GENITOURINARY: No dysuria, hematuria.  ENDOCRINE: No polyuria, nocturia,  HEMATOLOGY: No anemia, easy bruising or bleeding SKIN: No rash or lesion. MUSCULOSKELETAL: No joint pain or arthritis.   NEUROLOGIC: No tingling, numbness, weakness.  PSYCHIATRY: No anxiety or depression.   MEDICATIONS AT HOME:  Prior to Admission medications   Medication Sig Start Date End Date Taking? Authorizing Provider  aspirin EC 81 MG tablet Take 81 mg by mouth daily. breakfast   Yes [provider]  clobetasol cream (TEMOVATE) 5.95 % Apply 1 application as needed topically.   Yes [provider]  cyanocobalamin 1000 MCG tablet Take 1,000 mcg by mouth daily. breakfast   Yes  [provider]  DULoxetine (CYMBALTA) 20 MG capsule Take 20 mg daily by mouth.   Yes [provider]  furosemide (LASIX) 20 MG tablet Take 20 mg by mouth daily. May take second dose if needed for swelling.   Yes [provider]  gabapentin (NEURONTIN) 400 MG capsule Take 1 capsule (400 mg total) by mouth at bedtime. Patient taking differently: Take 800 mg at bedtime by mouth.  03/15/13  Yes Regal, Tamala Fothergill, DPM  hydrALAZINE (APRESOLINE) 50 MG tablet Take 1 tablet (50 mg total) by mouth every 6 (six) hours. Patient taking differently: Take 50 mg daily by mouth.  02/17/17  Yes Max Sane, MD  lisinopril (PRINIVIL,ZESTRIL) 5 MG tablet Take 1 tablet (5 mg total) by mouth daily. 08/08/16 08/08/17 Yes Delman Kitten, MD  metoprolol succinate (TOPROL-XL) 25 MG 24 hr tablet Take 25 mg daily by mouth.   Yes [provider]  metoprolol tartrate (LOPRESSOR) 25 MG tablet Take 25 mg by mouth daily. breakfast   Yes [provider]  pantoprazole (PROTONIX) 40 MG tablet Take 40 mg 2 (two) times daily by mouth.    Yes [provider]  simvastatin (ZOCOR) 20 MG tablet Take 20 mg by mouth daily. breakfast   Yes [provider]  solifenacin (VESICARE) 10 MG tablet Take 10 mg by mouth daily. breakfast   Yes [provider]  traMADol (ULTRAM) 50 MG tablet Take every 6 (six) hours as needed by mouth.   Yes [provider]  butalbital-acetaminophen-caffeine (FIORICET, ESGIC) 50-325-40 MG tablet Take 1 tablet by mouth every 6 (six) hours as needed for headache or migraine. Patient not taking: Reported on 04/18/2017 02/17/17   Max Sane, MD  glucose blood test strip 1 each by Other route as needed for other. Use as instructed    [provider]  hydrochlorothiazide (MICROZIDE) 12.5 MG capsule Take 1 capsule (12.5 mg total) by mouth daily. Patient not taking: Reported on 04/18/2017 02/17/17   Max Sane, MD  ondansetron (ZOFRAN ODT) 8 MG  disintegrating tablet Take 1 tablet (8 mg total) by mouth every 8 (eight) hours as needed for nausea or vomiting. Patient not taking: Reported on 04/01/2016 07/07/15   Carrie Mew, MD  OXYGEN Inhale 2 L into the lungs as needed (at night and as needed).    [provider]      PHYSICAL EXAMINATION:   VITAL SIGNS: Blood pressure (!) 165/75, pulse (!) 57, temperature 97.7 F (36.5 C), resp. rate 10, height 5\' 2"  (1.575 m), weight 175 lb (79.4 kg), SpO2 100 %.  GENERAL:  83 y.o.-year-old patient lying in the bed with no acute distress.  EYES: Pupils equal, round, reactive to light  and accommodation. No scleral icterus. Extraocular muscles intact.  HEENT: Head atraumatic, normocephalic. Oropharynx and nasopharynx clear.  NECK:  Supple, no jugular venous distention. No thyroid enlargement, no tenderness.  LUNGS: Bilateral crackles at the bases CARDIOVASCULAR: S1, S2 normal. No murmurs, rubs, or gallops.  ABDOMEN: Soft, nontender, nondistended. Bowel sounds present. No organomegaly or mass.  EXTREMITIES: 2+ pedal edema, cyanosis, or clubbing.  NEUROLOGIC: Cranial nerves II through XII are intact. Muscle strength 5/5 in all extremities. Sensation intact. Gait not checked.  PSYCHIATRIC: The patient is alert and oriented x 3.  SKIN: No obvious rash, lesion, or ulcer.   LABORATORY PANEL:   CBC Recent Labs  Lab 04/18/17 0919  WBC 10.6  HGB 13.9  HCT 42.5  PLT 200  MCV 88.7  MCH 28.9  MCHC 32.6  RDW 15.1*   ------------------------------------------------------------------------------------------------------------------  Chemistries  Recent Labs  Lab 04/18/17 0919 04/18/17 1256  NA 138 139  K 4.4 4.5  CL 101 104  CO2 27 28  GLUCOSE 124* 107*  BUN 28* 26*  CREATININE 1.55* 1.40*  CALCIUM 9.7 9.7   ------------------------------------------------------------------------------------------------------------------ estimated creatinine clearance is 30.2 mL/min (A)  (by C-G formula based on SCr of 1.4 mg/dL (H)). ------------------------------------------------------------------------------------------------------------------ No results for input(s): TSH, T4TOTAL, T3FREE, THYROIDAB in the last 72 hours.  Invalid input(s): FREET3   Coagulation profile No results for input(s): INR, PROTIME in the last 168 hours. ------------------------------------------------------------------------------------------------------------------- No results for input(s): DDIMER in the last 72 hours. -------------------------------------------------------------------------------------------------------------------  Cardiac Enzymes No results for input(s): CKMB, TROPONINI, MYOGLOBIN in the last 168 hours.  Invalid input(s): CK ------------------------------------------------------------------------------------------------------------------ Invalid input(s): POCBNP  ---------------------------------------------------------------------------------------------------------------  Urinalysis    Component Value Date/Time   COLORURINE YELLOW (A) 04/18/2017 1038   APPEARANCEUR CLEAR (A) 04/18/2017 1038   APPEARANCEUR Clear 07/06/2011 1402   LABSPEC 1.012 04/18/2017 1038   LABSPEC 1.009 07/06/2011 1402   PHURINE 6.0 04/18/2017 1038   GLUCOSEU NEGATIVE 04/18/2017 1038   GLUCOSEU Negative 07/06/2011 1402   HGBUR NEGATIVE 04/18/2017 1038   BILIRUBINUR NEGATIVE 04/18/2017 1038   BILIRUBINUR Negative 07/06/2011 1402   KETONESUR NEGATIVE 04/18/2017 1038   PROTEINUR NEGATIVE 04/18/2017 1038   UROBILINOGEN 0.2 05/19/2009 1130   NITRITE NEGATIVE 04/18/2017 1038   LEUKOCYTESUR NEGATIVE 04/18/2017 1038   LEUKOCYTESUR 1+ 07/06/2011 1402     RADIOLOGY: Dg Chest 2 View  Result Date: 04/18/2017 CLINICAL DATA:  CHF and COPD.  Lung crackles. EXAM: CHEST  2 VIEW COMPARISON:  07/07/2015 FINDINGS: Mild cardiac enlargement. No pleural effusion or edema. Aortic atherosclerosis. Diffuse  reticular interstitial opacities are noted throughout both lungs. This is a progressive finding when compared with comparison exam. IMPRESSION: 1. Increase in diffuse bilateral reticular interstitial opacities worrisome for progressed chronic interstitial lung disease. Consider further evaluation with high-resolution CT of the chest. 2.  Aortic Atherosclerosis (ICD10-I70.0). Electronically Signed   By: Kerby Moors M.D.   On: 04/18/2017 10:31   Ct Chest High Resolution  Result Date: 04/18/2017 CLINICAL DATA:  Evaluate interstitial lung disease. History of CHF. Remote smoking history. History of uterine cancer. EXAM: CT CHEST WITHOUT CONTRAST TECHNIQUE: Multidetector CT imaging of the chest was performed following the standard protocol without intravenous contrast. High resolution imaging of the lungs, as well as inspiratory and expiratory imaging, was performed. COMPARISON:  Chest radiograph from earlier today. 09/15/2011 chest CT. FINDINGS: Cardiovascular: Mild cardiomegaly. Trace pericardial effusion/ thickening, predominantly in the left dependent pericardial space . Left anterior descending coronary atherosclerosis. Atherosclerotic nonaneurysmal thoracic aorta. Top-normal caliber main pulmonary artery (3.0 cm  diameter). Mediastinum/Nodes: Stable mild multinodular goiter with dominant 3.6 cm hypodense left thyroid lobe nodule. Mildly patulous thoracic esophagus with small fluid level in the mid to lower thoracic esophagus. Mild left anterior mediastinal prevascular adenopathy measuring up to 1.3 cm (series 2/ image 37), minimally increased 1.0 cm on 09/15/2011. Right paratracheal adenopathy measuring up to 1.2 cm (series 2/ image 37), not appreciably changed. Stable AP window adenopathy measuring up to 1.1 cm (series 2/ image 45). No discrete enlarged hilar nodes on this noncontrast scan. No axillary adenopathy. Lungs/Pleura: No pneumothorax. No pleural effusion. No acute consolidative airspace disease,  lung masses or significant pulmonary nodules. There is extensive interlobular septal thickening throughout both lungs, significantly increased since 09/15/2011. There is patchy subpleural reticulation and ground-glass attenuation throughout both lungs with associated minimal traction bronchiolectasis and mild architectural distortion, basilar predominant. No frank honeycombing. No significant air trapping on the expiration sequence. Scattered punctate pulmonary parenchymal calcifications throughout both lungs. Upper abdomen: Stable granulomatous calcification at the right liver dome. Cholecystectomy. Musculoskeletal: No aggressive appearing focal osseous lesions. Partially visualized surgical fusion hardware in the lower cervical spine. Mild thoracic spondylosis. IMPRESSION: 1. Spectrum of findings most suggestive of pulmonary edema due to congestive heart failure superimposed on chronic basilar predominant fibrotic interstitial lung disease. 2. The absence of frank honeycombing and the absence of significant progression of the bronchiectasis since the 09/15/2011 chest CT study is not compatible with usual interstitial pneumonia (UIP), and the suspected underlying interstitial lung disease is most compatible with fibrotic phase nonspecific interstitial pneumonia (NSIP). 3. Mild cardiomegaly. One vessel coronary atherosclerosis. Trace pericardial effusion/thickening. 4. Mild mediastinal lymphadenopathy, stable to minimally increased since 2013, most suggestive of benign reactive adenopathy. 5. Stable mild multinodular goiter. Aortic Atherosclerosis (ICD10-I70.0). Electronically Signed   By: Ilona Sorrel M.D.   On: 04/18/2017 12:35    EKG: Orders placed or performed during the hospital encounter of 04/18/17  . ED EKG  . ED EKG  . EKG 12-Lead  . EKG 12-Lead  . EKG 12-Lead  . EKG 12-Lead    IMPRESSION AND PLAN: Patient is a 83 year old white female with multiple medical problems presenting with generalized  weakness  1.  Acute CHF type unknown Due to orthostatic hypotension, I will prescribe her low-dose IV Lasix Obtain echocardiogram of the heart Continue metoprolol Ask her cardiologist to see  2.  Orthostatic hypotension I have discontinued most of her other blood pressure medications We will check her blood pressure with Lasix   3, pulmonary fibrosis will need outpatient pulmonary follow-up  4.  Acute renal failure possibly related to fluid overload monitor renal function with IV Lasix  5.  Diabetes type 2 will place patient on sliding scale insulin  6.  Miscellaneous Lovenox for DVT prophylaxis   All the records are reviewed and case discussed with ED provider. Management plans discussed with the patient, family and they are in agreement.  CODE STATUS: Code Status History    Date Active Date Inactive Code Status Order ID Comments User Context   02/13/2017 16:25 02/17/2017 15:17 Full Code [id number]  Nicholes Mango, MD Inpatient    Advance Directive Documentation     Most Recent Value  Type of Advance Directive  Healthcare Power of Attorney, Living will  Pre-existing out of facility DNR order (yellow form or pink MOST form)  No data  "MOST" Form in Place?  No data       TOTAL TIME TAKING CARE OF THIS PATIENT: 55 minutes.    Markasia Carrol, [facility]  M.D on 04/18/2017 at 3:20 PM  Between 7am to 6pm - Pager - [phone]  After 6pm go to [email or web address] - password [email or web address] [facility]  [facility]  Office  [phone]  CC: Primary care physician; Katheren Shams

## 2017-04-19 ENCOUNTER — Inpatient Hospital Stay
Admit: 2017-04-19 | Discharge: 2017-04-19 | Disposition: A | Discharge status: Home / Self Care | Payer: Medicare Other | Attending: Internal Medicine | Admitting: Internal Medicine

## 2017-04-19 DIAGNOSIS — I110 Hypertensive heart disease with heart failure: Secondary | ICD-10-CM | POA: Diagnosis not present

## 2017-04-19 DIAGNOSIS — R531 Weakness: Secondary | ICD-10-CM | POA: Diagnosis not present

## 2017-04-19 LAB — GLUCOSE, CAPILLARY
GLUCOSE-CAPILLARY: 121 mg/dL — AB (ref 65–99)
Glucose-Capillary: 102 mg/dL — ABNORMAL HIGH (ref 65–99)
Glucose-Capillary: 91 mg/dL (ref 65–99)

## 2017-04-19 LAB — CBC
HCT: 39 % (ref 35.0–47.0)
Hemoglobin: 12.6 g/dL (ref 12.0–16.0)
MCH: 28.5 pg (ref 26.0–34.0)
MCHC: 32.2 g/dL (ref 32.0–36.0)
MCV: 88.4 fL (ref 80.0–100.0)
Platelets: 195 10*3/uL (ref 150–440)
RBC: 4.41 MIL/uL (ref 3.80–5.20)
RDW: 14.5 % (ref 11.5–14.5)
WBC: 9.6 10*3/uL (ref 3.6–11.0)

## 2017-04-19 LAB — BASIC METABOLIC PANEL
Anion gap: 7 (ref 5–15)
BUN: 26 mg/dL — AB (ref 6–20)
CHLORIDE: 104 mmol/L (ref 101–111)
CO2: 28 mmol/L (ref 22–32)
CREATININE: 1.29 mg/dL — AB (ref 0.44–1.00)
Calcium: 9.2 mg/dL (ref 8.9–10.3)
GFR calc Af Amer: 43 mL/min — ABNORMAL LOW (ref >60)
GFR calc non Af Amer: 37 mL/min — ABNORMAL LOW (ref >60)
GLUCOSE: 96 mg/dL (ref 65–99)
Potassium: 4 mmol/L (ref 3.5–5.1)
Sodium: 139 mmol/L (ref 135–145)

## 2017-04-19 LAB — ECHOCARDIOGRAM COMPLETE
Height: 63 in
Weight: 2796.8 oz

## 2017-04-19 MED ORDER — FUROSEMIDE 10 MG/ML IJ SOLN
60.0000 mg | Freq: Two times a day (BID) | INTRAMUSCULAR | Status: DC
  Administered 2017-04-19: 60 mg via INTRAVENOUS
  Filled 2017-04-19: qty 6

## 2017-04-19 MED ORDER — HYDRALAZINE HCL 25 MG PO TABS
25.0000 mg | ORAL_TABLET | Freq: Three times a day (TID) | ORAL | 0 refills | Status: AC
Start: 2017-04-19 — End: ?

## 2017-04-19 MED ORDER — ENOXAPARIN SODIUM 40 MG/0.4ML ~~LOC~~ SOLN: 40.0000 mg | SUBCUTANEOUS | Status: DC

## 2017-04-19 MED ORDER — LISINOPRIL 10 MG PO TABS
10.0000 mg | ORAL_TABLET | Freq: Every day | ORAL | Status: DC
  Administered 2017-04-19: 10 mg via ORAL
  Filled 2017-04-19: qty 1

## 2017-04-19 MED ORDER — ENSURE ENLIVE PO LIQD: 237.0000 mL | Freq: Two times a day (BID) | ORAL | Status: DC

## 2017-04-19 NOTE — Discharge Instructions (Signed)
Heart Failure Clinic appointment on April 27 2017 at 12:00pm with Darylene Price, [place]. Please call [phone] to reschedule.

## 2017-04-19 NOTE — Progress Notes (Signed)
Enoxaparin   Patient qualifies for Enoxaparin 40 mg SQ daily based on CrCl >30 ml/min per policy. Will change to Enoxaparin 40 mg SQ daily.  Jameelah Watts D. Malinda Mayden, PharmD   

## 2017-04-19 NOTE — Progress Notes (Signed)
Patient discharged home as ordered,vital sings within normal limits upon discharge,instructions explained and well understood,follow up appointments made,escorted by daughter and staff member via wheel chair.

## 2017-04-19 NOTE — Progress Notes (Signed)
Nutrition Education Note  RD consulted for nutrition education regarding new onset CHF.  83 y.o. female with a known history of congestive heart failure in the past, recent admission for accelerated hypertension, COPD, oxygen use at nighttime and as needed during the day, diabetes type 2 admitted for CHF.  RD provided "Low Sodium Nutrition Therapy" handout from the Academy of [employer]etics. Reviewed patient's dietary recall. Provided examples on ways to decrease sodium intake in diet. Discouraged intake of processed foods and use of salt shaker. Encouraged fresh fruits and vegetables as well as whole grain sources of carbohydrates to maximize fiber intake.   RD discussed why it is important for patient to adhere to diet recommendations, and emphasized the role of fluids, foods to avoid, and importance of weighing self daily. Teach back method used.  Expect fair compliance.  Body mass index is 30.96 kg/m. Pt meets criteria for obese based on current BMI.  Current diet order is heart healthy, patient is consuming approximately 85% of meals at this time.   RD will order Ensure Enlive po BID, each supplement provides 350 kcal and 20 grams of protein  Labs and medications reviewed. No further nutrition interventions warranted at this time. RD contact information provided. If additional nutrition issues arise, please re-consult RD.   Koleen Distance MS, RD, LDN Pager #- [phone] After Hours Pager: [phone]

## 2017-04-19 NOTE — Care Management Note (Signed)
Case Management Note  Patient Details  Name: [name] MRN: [id number] Date of Birth: 08-03-1932  Subjective/Objective:                 Admitted with heart failure and pulmonary fibrosis. Cardiology to consult.  No issues accessing Medical care or obtaining medications. has had home health with [facility] in the past.  Has a walker and bsc.  Has access to scales. Not requiring oxygen.   Action/Plan:   Expected Discharge Date:                  Expected Discharge Plan:     In-House Referral:     Discharge planning Services     Post Acute Care Choice:    Choice offered to:     DME Arranged:    DME Agency:     HH Arranged:    HH Agency:     Status of Service:     If discussed at [employer] of Stay Meetings, dates discussed:    Additional Comments:  Katrina Stack, RN 04/19/2017, 8:50 AM

## 2017-04-19 NOTE — Plan of Care (Signed)
Patient oriented to room, call bell, and staff numbers. Expectations flyer, white board, patient guide and  safety contract reviewed and signed. Call bell/ telephone in reach. Bed alarm and yellow socks on. Plan of care reviewed. All questions were answered. No concerns at this time. Will continue to monitor.

## 2017-04-19 NOTE — Progress Notes (Signed)
*  PRELIMINARY RESULTS* Echocardiogram 2D Echocardiogram has been performed.  Sherrie Sport 04/19/2017, 2:04 PM

## 2017-04-20 NOTE — Discharge Summary (Signed)
[facility] [place] NAME: [name]    MR#:  [id number]  DATE OF BIRTH:  Jul 23, 1932  DATE OF ADMISSION:  04/18/2017 ADMITTING PHYSICIAN: Dustin Flock, MD  DATE OF DISCHARGE: 04/19/2017  5:36 PM  PRIMARY CARE PHYSICIAN: [facility]   ADMISSION DIAGNOSIS:  Acute pulmonary edema (HCC) [J81.0] Orthostasis [I95.1] Acute respiratory failure with hypoxia ([place]) [J96.01] AKI (acute kidney injury) ([place]) [N17.9] Near syncope [R55]  DISCHARGE DIAGNOSIS:  Active Problems:   Acute CHF (congestive heart failure) ([place])   SECONDARY DIAGNOSIS:   Past Medical History:  Diagnosis Date  . Anxiety   . Arthritis    hands  . Cancer ([place])   . CHF (congestive heart failure) (HCC)    swelling of feet and legs  . COPD (chronic obstructive pulmonary disease) (HCC)    O2 use at night 2 liters  . DDD (degenerative disc disease), cervical    cervical laminectomy x 2  . Deaf, right   . Depression   . Diabetes mellitus without complication ([place])   . GERD (gastroesophageal reflux disease)   . High cholesterol   . History of hiatal hernia   . Hypertension    controlled on meds  . Neuromuscular disorder (HCC)    neuropathy  . PONV (postoperative nausea and vomiting)   . Shortness of breath dyspnea   . Vertigo    last episode approx 1 yr ago  . Wears dentures    full upper and lower     ADMITTING HISTORY  HISTORY OF PRESENT ILLNESS: [name]  is a 83 y.o. female with a known history of congestive heart failure in the past, recent admission for accelerated hypertension, COPD, oxygen use at nighttime and as needed during the day, diabetes type 2 not on any medications who is presenting to the ED with complaining of feeling very weak and tired this morning.  She could not get up and walk because she was so weak.  When EMS got there her blood pressure was in the 60s.  Patient in the emergency room was noted to have hypoxia and low oxygen.   Therefore she had a CT scan of the chest which showed findings consistent with pulmonary fibrosis as well as acute CHF.  Patient now is requiring 3 L of oxygen.  She states that she has some swelling of the legs but denies any chest pain . She reports that her shortness of breath is at baseline.    HOSPITAL COURSE:   *Acute on chronic diastolic congestive heart failure.  Patient was diuresed with IV Lasix.  Oral Lasix at discharge.  Follow-up with cardiology Dr. Clayborn Bigness within 1 week. Echocardiogram showed ejection fraction 80% with hypertrophy  *Hypotension.  Patient had one episode of hypotension at home which she had checked.  No other episodes in the hospital.  At this time her hydrochlorothiazide is being stopped.  Continue other medications.  Blood pressure stable.  *Other comorbidities remained stable in the hospital.  Discharge home in stable condition to follow-up with her primary care physician and cardiologist Dr. call Clydene Laming in 1 week.   CONSULTS OBTAINED:  Treatment Team:  Yolonda Kida, MD  DRUG ALLERGIES:   Allergies  Allergen Reactions  . Ciprofloxacin Nausea And Vomiting  . Hydrocodone Nausea Only  . Sertraline Nausea And Vomiting  . Codeine Nausea And Vomiting    DISCHARGE MEDICATIONS:   Discharge Medication List as of 04/19/2017  4:54 PM    CONTINUE  these medications which have CHANGED   Details  hydrALAZINE (APRESOLINE) 25 MG tablet Take 1 tablet (25 mg total) 3 (three) times daily by mouth., Starting Tue 04/19/2017, Normal      CONTINUE these medications which have NOT CHANGED   Details  aspirin EC 81 MG tablet Take 81 mg by mouth daily. breakfast, Historical Med    clobetasol cream (TEMOVATE) 2.87 % Apply 1 application as needed topically., Historical Med    cyanocobalamin 1000 MCG tablet Take 1,000 mcg by mouth daily. breakfast, Historical Med    DULoxetine (CYMBALTA) 20 MG capsule Take 20 mg daily by mouth., Historical Med    furosemide  (LASIX) 20 MG tablet Take 20 mg by mouth daily. May take second dose if needed for swelling., Historical Med    gabapentin (NEURONTIN) 400 MG capsule Take 1 capsule (400 mg total) by mouth at bedtime., Starting Thu 03/15/2013, Normal    lisinopril (PRINIVIL,ZESTRIL) 5 MG tablet Take 1 tablet (5 mg total) by mouth daily., Starting Sun 08/08/2016, Until Mon 08/08/2017, Print    metoprolol succinate (TOPROL-XL) 25 MG 24 hr tablet Take 25 mg daily by mouth., Historical Med    pantoprazole (PROTONIX) 40 MG tablet Take 40 mg 2 (two) times daily by mouth. , Historical Med    simvastatin (ZOCOR) 20 MG tablet Take 20 mg by mouth daily. breakfast, Historical Med    solifenacin (VESICARE) 10 MG tablet Take 10 mg by mouth daily. breakfast, Historical Med    traMADol (ULTRAM) 50 MG tablet Take every 6 (six) hours as needed by mouth., Historical Med    butalbital-acetaminophen-caffeine (FIORICET, ESGIC) 50-325-40 MG tablet Take 1 tablet by mouth every 6 (six) hours as needed for headache or migraine., Starting Thu 02/17/2017, Print    glucose blood test strip 1 each by Other route as needed for other. Use as instructed, Historical Med    ondansetron (ZOFRAN ODT) 8 MG disintegrating tablet Take 1 tablet (8 mg total) by mouth every 8 (eight) hours as needed for nausea or vomiting., Starting Mon 07/07/2015, Print    OXYGEN Inhale 2 L into the lungs as needed (at night and as needed)., Historical Med      STOP taking these medications     metoprolol tartrate (LOPRESSOR) 25 MG tablet      hydrochlorothiazide (MICROZIDE) 12.5 MG capsule         Today   VITAL SIGNS:  Blood pressure 135/76, pulse 81, temperature 98.3 F (36.8 C), temperature source Oral, resp. rate 16, height 5\' 3"  (1.6 m), weight 79.3 kg (174 lb 12.8 oz), SpO2 94 %.  I/O:  No intake or output data in the 24 hours ending 04/20/17 1659  PHYSICAL EXAMINATION:  Physical Exam  GENERAL:  83 y.o.-year-old patient lying in the bed with no  acute distress.  LUNGS: Normal breath sounds bilaterally, no wheezing, rales,rhonchi or crepitation. No use of accessory muscles of respiration.  CARDIOVASCULAR: S1, S2 normal. No murmurs, rubs, or gallops.  ABDOMEN: Soft, non-tender, non-distended. Bowel sounds present. No organomegaly or mass.  NEUROLOGIC: Moves all 4 extremities. PSYCHIATRIC: The patient is alert and oriented x 3.  SKIN: No obvious rash, lesion, or ulcer.   DATA REVIEW:   CBC Recent Labs  Lab 04/19/17 0315  WBC 9.6  HGB 12.6  HCT 39.0  PLT 195    Chemistries  Recent Labs  Lab 04/19/17 0315  NA 139  K 4.0  CL 104  CO2 28  GLUCOSE 96  BUN 26*  CREATININE  1.29*  CALCIUM 9.2    Cardiac Enzymes No results for input(s): TROPONINI in the last 168 hours.  Microbiology Results  Results for orders placed or performed during the hospital encounter of 07/07/15  Blood culture (routine x 2)     Status: None   Collection Time: 07/07/15 11:36 AM  Result Value Ref Range Status   Specimen Description BLOOD LEFT ASSIST CONTROL  Final   Special Requests BOTTLES DRAWN AEROBIC AND ANAEROBIC [place]  Final   Culture NO GROWTH 5 DAYS  Final   Report Status 07/12/2015 FINAL  Final  Blood culture (routine x 2)     Status: None   Collection Time: 07/07/15 11:36 AM  Result Value Ref Range Status   Specimen Description BLOOD LEFT ASSIST CONTROL  Final   Special Requests BOTTLES DRAWN AEROBIC AND ANAEROBIC [place]  Final   Culture NO GROWTH 5 DAYS  Final   Report Status 07/12/2015 FINAL  Final    RADIOLOGY:  No results found.  Follow up with PCP in 1 week.  Management plans discussed with the patient, family and they are in agreement.  CODE STATUS:  Code Status History    Date Active Date Inactive Code Status Order ID Comments User Context   04/18/2017 23:45 04/19/2017 20:40 Full Code [id number]  Dustin Flock, MD Inpatient   02/13/2017 16:25 02/17/2017 15:17 Full Code [id number]  Nicholes Mango, MD  Inpatient    Advance Directive Documentation     Most Recent Value  Type of Advance Directive  Healthcare Power of Attorney, Living will  Pre-existing out of facility DNR order (yellow form or pink MOST form)  No data  "MOST" Form in Place?  No data      TOTAL TIME TAKING CARE OF THIS PATIENT ON DAY OF DISCHARGE: more than 30 minutes.   Hillary Bow R M.D on 04/20/2017 at 4:59 PM  Between 7am to 6pm - Pager - [phone]  After 6pm go to [email or web address] - password [email or web address] [facility]  Office  [phone]  CC: Primary care physician; Katheren Shams  Note: This dictation was prepared with Dragon dictation along with smaller phrase technology. Any transcriptional errors that result from this process are unintentional.

## 2017-04-21 NOTE — Consult Note (Signed)
Reason for Consult: Shortness of breath congestive heart failure Referring Physician: Dr. Posey Pronto hospitalist, Frazier Richards primary Cardiologist Dr. Harriet Masson [name] is an 83 y.o. female.  HPI: Patient has a known history of congestive heart failure systolic dysfunction COPD hypoxemia renal insufficiency anxiety hypertension diabetes.  Patient got more short of breath even on supplemental oxygen at home came to emergency room and found to be in acute on chronic heart failure.  Denies any significant chest pain had episodes of feeling lightheaded and dizzy and almost passed out.  Patient states to be compliant with her medications  Past Medical History:  Diagnosis Date  . Anxiety   . Arthritis    hands  . Cancer ([place])   . CHF (congestive heart failure) (HCC)    swelling of feet and legs  . COPD (chronic obstructive pulmonary disease) (HCC)    O2 use at night 2 liters  . DDD (degenerative disc disease), cervical    cervical laminectomy x 2  . Deaf, right   . Depression   . Diabetes mellitus without complication ([place])   . GERD (gastroesophageal reflux disease)   . High cholesterol   . History of hiatal hernia   . Hypertension    controlled on meds  . Neuromuscular disorder (HCC)    neuropathy  . PONV (postoperative nausea and vomiting)   . Shortness of breath dyspnea   . Vertigo    last episode approx 1 yr ago  . Wears dentures    full upper and lower    Past Surgical History:  Procedure Laterality Date  . ABDOMINAL HYSTERECTOMY  1963  . APPENDECTOMY    . BACK SURGERY     cervical laminectomy x2  . CHOLECYSTECTOMY    . JOINT REPLACEMENT     hip / also revision  . SHOULDER SURGERY      History reviewed. No pertinent family history.  Social History:  reports that she quit smoking about 17 years ago. Her smoking use included cigarettes. She has a 5.00 pack-year smoking history. she has never used smokeless tobacco. She reports that she does not drink alcohol  or use drugs.  Allergies:  Allergies  Allergen Reactions  . Ciprofloxacin Nausea And Vomiting  . Hydrocodone Nausea Only  . Sertraline Nausea And Vomiting  . Codeine Nausea And Vomiting    Medications: I have reviewed the patient's current medications.  No results found for this or any previous visit (from the past 48 hour(s)).  No results found.  Review of Systems  Constitutional: Positive for malaise/fatigue.  HENT: Positive for congestion.   Eyes: Negative.   Respiratory: Positive for shortness of breath.   Cardiovascular: Positive for orthopnea, leg swelling and PND.  Gastrointestinal: Negative.   Genitourinary: Negative.   Musculoskeletal: Positive for myalgias.  Skin: Negative.   Neurological: Positive for weakness.  Endo/Heme/Allergies: Negative.   Psychiatric/Behavioral: Negative.    Blood pressure 135/76, pulse 81, temperature 98.3 F (36.8 C), temperature source Oral, resp. rate 16, height 5\' 3"  (1.6 m), weight 174 lb 12.8 oz (79.3 kg), SpO2 94 %. Physical Exam  Nursing note and vitals reviewed. Constitutional: She is oriented to person, place, and time. She appears well-developed and well-nourished.  HENT:  Head: Normocephalic and atraumatic.  Eyes: Conjunctivae and EOM are normal. Pupils are equal, round, and reactive to light.  Neck: Normal range of motion. Neck supple.  Cardiovascular: Normal rate and regular rhythm. Exam reveals gallop.  Murmur heard. Respiratory: Effort normal and breath sounds  normal.  GI: Soft. Bowel sounds are normal.  Musculoskeletal: Normal range of motion. She exhibits edema.  Neurological: She is alert and oriented to person, place, and time. She has normal reflexes.  Skin: Skin is warm.  Psychiatric: She has a normal mood and affect.    Assessment/Plan: Congestive heart failure  Cardiomyopathy Chronic renal insufficiency Hypoxemia Pulmonary fibrosis COPD Anxiety Shortness of breath Diabetes Near  syncope Hypotension . Plan Agree with admission Continue supplemental oxygen therapy Continue inhalers as necessary Former smoker continue to advised to refrain from tobacco abuse Maintain diabetes management Chronic renal insufficiency consider nephrology follow-up Hyperlipidemia therapy with Zocor Hypertension management beta-blocker hydralazine lisinopril Continue diuretic therapy for heart failure and shortness of breath Near syncope probably related to hypoxemia and shortness of breath  [name] D [name] 04/21/2017, 1:13 PM

## 2017-04-27 ENCOUNTER — Ambulatory Visit: Payer: Medicare Other | Admitting: Family

## 2017-09-02 ENCOUNTER — Other Ambulatory Visit: Payer: Self-pay | Admitting: Internal Medicine

## 2017-09-02 DIAGNOSIS — E049 Nontoxic goiter, unspecified: Secondary | ICD-10-CM

## 2017-09-13 ENCOUNTER — Ambulatory Visit
Admission: RE | Admit: 2017-09-13 | Discharge: 2017-09-13 | Disposition: A | Discharge status: Home / Self Care | Payer: Medicare Other | Source: Ambulatory Visit | Attending: Internal Medicine | Admitting: Internal Medicine

## 2017-09-13 DIAGNOSIS — I6523 Occlusion and stenosis of bilateral carotid arteries: Secondary | ICD-10-CM | POA: Insufficient documentation

## 2017-09-13 DIAGNOSIS — E041 Nontoxic single thyroid nodule: Secondary | ICD-10-CM | POA: Diagnosis not present

## 2017-09-13 DIAGNOSIS — J8410 Pulmonary fibrosis, unspecified: Secondary | ICD-10-CM | POA: Diagnosis not present

## 2017-09-13 DIAGNOSIS — E049 Nontoxic goiter, unspecified: Secondary | ICD-10-CM | POA: Diagnosis present

## 2017-09-13 LAB — POCT I-STAT CREATININE: Creatinine, Ser: 1.4 mg/dL — ABNORMAL HIGH (ref 0.44–1.00)

## 2017-09-13 MED ORDER — IOHEXOL 300 MG/ML  SOLN: 75.0000 mL | Freq: Once | INTRAMUSCULAR | Status: DC | PRN

## 2017-09-13 MED ORDER — IOHEXOL 300 MG/ML  SOLN
50.0000 mL | Freq: Once | INTRAMUSCULAR | Status: AC | PRN
Start: 2017-09-13 — End: 2017-09-13
  Administered 2017-09-13: 50 mL via INTRAVENOUS

## 2017-10-19 ENCOUNTER — Ambulatory Visit
Admission: RE | Admit: 2017-10-19 | Discharge: 2017-10-19 | Disposition: A | Discharge status: Home / Self Care | Payer: Medicare Other | Source: Ambulatory Visit | Attending: Internal Medicine | Admitting: Internal Medicine

## 2017-10-19 ENCOUNTER — Other Ambulatory Visit: Payer: Self-pay | Admitting: Internal Medicine

## 2017-10-19 DIAGNOSIS — R600 Localized edema: Secondary | ICD-10-CM

## 2017-11-02 ENCOUNTER — Emergency Department
Admission: EM | Admit: 2017-11-02 | Discharge: 2017-11-02 | Disposition: A | Discharge status: Home / Self Care | Payer: Medicare Other | Attending: Emergency Medicine | Admitting: Emergency Medicine

## 2017-11-02 ENCOUNTER — Encounter: Payer: Self-pay | Admitting: Emergency Medicine

## 2017-11-02 ENCOUNTER — Other Ambulatory Visit: Payer: Self-pay

## 2017-11-02 ENCOUNTER — Emergency Department: Payer: Medicare Other

## 2017-11-02 DIAGNOSIS — F419 Anxiety disorder, unspecified: Secondary | ICD-10-CM | POA: Diagnosis not present

## 2017-11-02 DIAGNOSIS — E119 Type 2 diabetes mellitus without complications: Secondary | ICD-10-CM | POA: Insufficient documentation

## 2017-11-02 DIAGNOSIS — J449 Chronic obstructive pulmonary disease, unspecified: Secondary | ICD-10-CM | POA: Diagnosis not present

## 2017-11-02 DIAGNOSIS — I509 Heart failure, unspecified: Secondary | ICD-10-CM | POA: Insufficient documentation

## 2017-11-02 DIAGNOSIS — Z9049 Acquired absence of other specified parts of digestive tract: Secondary | ICD-10-CM | POA: Insufficient documentation

## 2017-11-02 DIAGNOSIS — I110 Hypertensive heart disease with heart failure: Secondary | ICD-10-CM | POA: Insufficient documentation

## 2017-11-02 DIAGNOSIS — R0602 Shortness of breath: Secondary | ICD-10-CM | POA: Diagnosis present

## 2017-11-02 DIAGNOSIS — Z79899 Other long term (current) drug therapy: Secondary | ICD-10-CM | POA: Diagnosis not present

## 2017-11-02 DIAGNOSIS — J40 Bronchitis, not specified as acute or chronic: Secondary | ICD-10-CM | POA: Insufficient documentation

## 2017-11-02 DIAGNOSIS — Z7982 Long term (current) use of aspirin: Secondary | ICD-10-CM | POA: Diagnosis not present

## 2017-11-02 LAB — BASIC METABOLIC PANEL
Anion gap: 8 (ref 5–15)
BUN: 21 mg/dL — ABNORMAL HIGH (ref 6–20)
CHLORIDE: 104 mmol/L (ref 101–111)
CO2: 27 mmol/L (ref 22–32)
Calcium: 9.7 mg/dL (ref 8.9–10.3)
Creatinine, Ser: 1.15 mg/dL — ABNORMAL HIGH (ref 0.44–1.00)
GFR calc non Af Amer: 43 mL/min — ABNORMAL LOW (ref >60)
GFR, EST AFRICAN AMERICAN: 50 mL/min — AB (ref >60)
Glucose, Bld: 129 mg/dL — ABNORMAL HIGH (ref 65–99)
Potassium: 4.2 mmol/L (ref 3.5–5.1)
SODIUM: 139 mmol/L (ref 135–145)

## 2017-11-02 LAB — CBC WITH DIFFERENTIAL/PLATELET
BASOS PCT: 1 %
Basophils Absolute: 0.2 10*3/uL — ABNORMAL HIGH (ref 0–0.1)
EOS ABS: 0.5 10*3/uL (ref 0–0.7)
EOS PCT: 4 %
HCT: 41.9 % (ref 35.0–47.0)
HEMOGLOBIN: 13.6 g/dL (ref 12.0–16.0)
LYMPHS ABS: 1.3 10*3/uL (ref 1.0–3.6)
Lymphocytes Relative: 10 %
MCH: 27 pg (ref 26.0–34.0)
MCHC: 32.4 g/dL (ref 32.0–36.0)
MCV: 83.3 fL (ref 80.0–100.0)
MONO ABS: 0.9 10*3/uL (ref 0.2–0.9)
MONOS PCT: 7 %
NEUTROS PCT: 78 %
Neutro Abs: 10.5 10*3/uL — ABNORMAL HIGH (ref 1.4–6.5)
PLATELETS: 280 10*3/uL (ref 150–440)
RBC: 5.04 MIL/uL (ref 3.80–5.20)
RDW: 17.4 % — AB (ref 11.5–14.5)
WBC: 13.3 10*3/uL — ABNORMAL HIGH (ref 3.6–11.0)

## 2017-11-02 LAB — TROPONIN I: Troponin I: <0.03 ng/mL (ref <0.03)

## 2017-11-02 LAB — BRAIN NATRIURETIC PEPTIDE: B Natriuretic Peptide: 188 pg/mL — ABNORMAL HIGH (ref 0.0–100.0)

## 2017-11-02 MED ORDER — ALBUTEROL SULFATE HFA 108 (90 BASE) MCG/ACT IN AERS: 2.0000 | INHALATION_SPRAY | Freq: Four times a day (QID) | RESPIRATORY_TRACT | 2 refills | Status: AC | PRN

## 2017-11-02 MED ORDER — IOPAMIDOL (ISOVUE-370) INJECTION 76%
75.0000 mL | Freq: Once | INTRAVENOUS | Status: AC | PRN
  Administered 2017-11-02: 60 mL via INTRAVENOUS

## 2017-11-02 MED ORDER — DOXYCYCLINE HYCLATE 100 MG PO CAPS: 100.0000 mg | ORAL_CAPSULE | Freq: Two times a day (BID) | ORAL | 0 refills | Status: AC

## 2017-11-02 MED ORDER — DOXYCYCLINE HYCLATE 100 MG PO TABS
100.0000 mg | ORAL_TABLET | Freq: Once | ORAL | Status: AC
  Administered 2017-11-02: 100 mg via ORAL
  Filled 2017-11-02: qty 1

## 2017-11-02 MED ORDER — FUROSEMIDE 10 MG/ML IJ SOLN
40.0000 mg | Freq: Once | INTRAMUSCULAR | Status: AC
  Administered 2017-11-02: 40 mg via INTRAVENOUS
  Filled 2017-11-02: qty 4

## 2017-11-02 MED ORDER — METHYLPREDNISOLONE SODIUM SUCC 125 MG IJ SOLR
125.0000 mg | Freq: Once | INTRAMUSCULAR | Status: AC
Start: 2017-11-02 — End: 2017-11-02
  Administered 2017-11-02: 125 mg via INTRAVENOUS
  Filled 2017-11-02: qty 2

## 2017-11-02 MED ORDER — PREDNISONE 20 MG PO TABS: 60.0000 mg | ORAL_TABLET | Freq: Every day | ORAL | 0 refills | Status: AC

## 2017-11-02 MED ORDER — IPRATROPIUM-ALBUTEROL 0.5-2.5 (3) MG/3ML IN SOLN
3.0000 mL | Freq: Once | RESPIRATORY_TRACT | Status: AC
  Administered 2017-11-02: 3 mL via RESPIRATORY_TRACT
  Filled 2017-11-02: qty 3

## 2017-11-02 NOTE — ED Provider Notes (Signed)
Patient received in sign-out from Dr. Alfred Levins.  Workup and evaluation pending reassessment after diuresis.  Patient did very well ambulating without hypoxia.  Denies any chest pain.  States that she feels comfortable going home.  Does have good support at home and I do believe she stable and appropriate for outpatient follow-up.Kristy Lot, MD 11/02/17 [phone]

## 2017-11-02 NOTE — ED Notes (Signed)
Pt ambulated down hall approx 25 feet with this RN on 4lpm [place] and pulse ox. Pt pulse ox 92-94% while walking but once back onto stretcher pulse ox 88% for approx 1 minute. Pt instructed to take deep breaths. Pt reports feeling very anxious while walking. After 2 minutes of relaxing, pt pulse ox to 98% while remaining on 4lpm [place], pt denies any pain or dizziness will notify MD.

## 2017-11-02 NOTE — ED Triage Notes (Signed)
C/O increasing SOB and difficulty breathing for the past several weeks.  Alsoc/o intermittent episodes of chest pain and back pain.  Wears oxygen at night time (2L/ [place]), but has been wearing oxygen more frequently over past weeks.

## 2017-11-02 NOTE — ED Notes (Signed)
Patient transported to CT via stretcher.

## 2017-11-02 NOTE — ED Provider Notes (Signed)
[facility] Emergency Department Provider Note  ____________________________________________  Time seen: Approximately 12:01 PM  I have reviewed the triage vital signs and the nursing notes.   HISTORY  Chief Complaint Shortness of Breath   HPI [name] is a 84 y.o. female with a history of diastolic CHF, COPD on oxygen at home as needed, hypertension, diabetes who presents for evaluation of shortness of breath.  Patient reports progressively worsening shortness of breath since yesterday.  Needs oxygen all day yesterday and today. No changes to her chronic cough.  Today the shortness of breath became severe.  Patient reports that she is unable to take more than 3 steps without feeling severely short of breath and developing chest pain that she describes as a sharp pain in the center of her chest.  If she sits down and puts oxygen the chest pain goes away.  She is complaining of shortness of breath that is mild at rest.  She does have asymmetric leg swelling however that is been ongoing issue for several weeks and patient had an ultrasound done of her legs 2 weeks ago which was negative for DVT.  She denies any personal or family history of DVT or PEs, she has no chest pain at rest, she has no fever or chills, no hemoptysis, no recent travel immobilization.  Past Medical History:  Diagnosis Date  . Anxiety   . Arthritis    hands  . Cancer ([place])   . CHF (congestive heart failure) (HCC)    swelling of feet and legs  . COPD (chronic obstructive pulmonary disease) (HCC)    O2 use at night 2 liters  . DDD (degenerative disc disease), cervical    cervical laminectomy x 2  . Deaf, right   . Depression   . Diabetes mellitus without complication ([place])   . GERD (gastroesophageal reflux disease)   . High cholesterol   . History of hiatal hernia   . Hypertension    controlled on meds  . Neuromuscular disorder (HCC)    neuropathy  . PONV (postoperative nausea  and vomiting)   . Shortness of breath dyspnea   . Vertigo    last episode approx 1 yr ago  . Wears dentures    full upper and lower    Patient Active Problem List   Diagnosis Date Noted  . Acute CHF (congestive heart failure) ([place]) 04/18/2017  . Hypertension 02/16/2017  . Renal artery stenosis ([place]) 02/16/2017  . Acute gastritis without bleeding 02/13/2017    Past Surgical History:  Procedure Laterality Date  . ABDOMINAL HYSTERECTOMY  1963  . APPENDECTOMY    . BACK SURGERY     cervical laminectomy x2  . CATARACT EXTRACTION W/PHACO Right 03/10/2016   Procedure: CATARACT EXTRACTION PHACO AND INTRAOCULAR LENS PLACEMENT (IOC);  Surgeon: Leandrew Koyanagi, MD;  Location: [facility];  Service: Ophthalmology;  Laterality: Right;  DIABETIC - oral meds  . CATARACT EXTRACTION W/PHACO Left 04/07/2016   Procedure: CATARACT EXTRACTION PHACO AND INTRAOCULAR LENS PLACEMENT (IOC);  Surgeon: Leandrew Koyanagi, MD;  Location: [facility];  Service: Ophthalmology;  Laterality: Left;  DIABETIC, oral med [place]    . ESOPHAGOGASTRODUODENOSCOPY N/A 07/18/2015   Procedure: ESOPHAGOGASTRODUODENOSCOPY (EGD);  Surgeon: Hulen Luster, MD;  Location: [facility] ENDOSCOPY;  Service: Gastroenterology;  Laterality: N/A;  . JOINT REPLACEMENT     hip / also revision  . SHOULDER SURGERY      Prior to Admission medications   Medication  Sig Start Date End Date Taking? Authorizing Provider  albuterol (PROVENTIL HFA;VENTOLIN HFA) 108 (90 Base) MCG/ACT inhaler Inhale 2 puffs into the lungs every 6 (six) hours as needed for wheezing or shortness of breath. 11/02/17   Alfred Levins, [place], MD  aspirin EC 81 MG tablet Take 81 mg by mouth daily. breakfast    [provider]  butalbital-acetaminophen-caffeine (FIORICET, ESGIC) 50-325-40 MG tablet Take 1 tablet by mouth every 6 (six) hours as needed for headache or migraine. Patient not taking: Reported on 04/18/2017 02/17/17   Max Sane,  MD  clobetasol cream (TEMOVATE) 7.49 % Apply 1 application as needed topically.    [provider]  cyanocobalamin 1000 MCG tablet Take 1,000 mcg by mouth daily. breakfast    [provider]  doxycycline (VIBRAMYCIN) 100 MG capsule Take 1 capsule (100 mg total) by mouth 2 (two) times daily for 7 days. 11/02/17 11/09/17  Rudene Re, MD  DULoxetine (CYMBALTA) 20 MG capsule Take 20 mg daily by mouth.    [provider]  furosemide (LASIX) 20 MG tablet Take 20 mg by mouth daily. May take second dose if needed for swelling.    [provider]  gabapentin (NEURONTIN) 400 MG capsule Take 1 capsule (400 mg total) by mouth at bedtime. Patient taking differently: Take 800 mg at bedtime by mouth.  03/15/13   Wallene Huh, DPM  glucose blood test strip 1 each by Other route as needed for other. Use as instructed    [provider]  hydrALAZINE (APRESOLINE) 25 MG tablet Take 1 tablet (25 mg total) 3 (three) times daily by mouth. 04/19/17   Hillary Bow, MD  lisinopril (PRINIVIL,ZESTRIL) 5 MG tablet Take 1 tablet (5 mg total) by mouth daily. 08/08/16 08/08/17  Delman Kitten, MD  metoprolol succinate (TOPROL-XL) 25 MG 24 hr tablet Take 25 mg daily by mouth.    [provider]  ondansetron (ZOFRAN ODT) 8 MG disintegrating tablet Take 1 tablet (8 mg total) by mouth every 8 (eight) hours as needed for nausea or vomiting. Patient not taking: Reported on 04/01/2016 07/07/15   Carrie Mew, MD  OXYGEN Inhale 2 L into the lungs as needed (at night and as needed).    [provider]  pantoprazole (PROTONIX) 40 MG tablet Take 40 mg 2 (two) times daily by mouth.     [provider]  predniSONE (DELTASONE) 20 MG tablet Take 3 tablets (60 mg total) by mouth daily for 4 days. 11/02/17 11/06/17  Rudene Re, MD  simvastatin (ZOCOR) 20 MG tablet Take 20 mg by mouth daily. breakfast    [provider]  solifenacin (VESICARE) 10 MG  tablet Take 10 mg by mouth daily. breakfast    [provider]  traMADol (ULTRAM) 50 MG tablet Take every 6 (six) hours as needed by mouth.    [provider]    Allergies Ciprofloxacin; Hydrocodone; Sertraline; and Codeine  No family history on file.  Social History Social History   Tobacco Use  . Smoking status: Former Smoker    Packs/day: 0.25    Years: 20.00    Pack years: 5.00    Types: Cigarettes    Last attempt to quit: 03/14/2000    Years since quitting: 17.6  . Smokeless tobacco: Never Used  Substance Use Topics  . Alcohol use: No  . Drug use: No    Review of Systems  Constitutional: Negative for fever. Eyes: Negative for visual changes. ENT: Negative for sore throat. Neck: No  neck pain  Cardiovascular: + chest pain. Respiratory: + shortness of breath. Gastrointestinal: Negative for abdominal pain, vomiting or diarrhea. Genitourinary: Negative for dysuria. Musculoskeletal: Negative for back pain. Skin: Negative for rash. Neurological: Negative for headaches, weakness or numbness. Psych: No SI or HI  ____________________________________________   PHYSICAL EXAM:  VITAL SIGNS: ED Triage Vitals  Enc Vitals Group     BP 11/02/17 1116 121/89     Pulse Rate 11/02/17 1116 93     Resp 11/02/17 1116 20     Temp 11/02/17 1134 98.5 F (36.9 C)     Temp Source 11/02/17 1134 Oral     SpO2 11/02/17 1116 (!) 85 %     Weight 11/02/17 1115 170 lb (77.1 kg)     Height 11/02/17 1115 5\' 4"  (1.626 m)     Head Circumference --      Peak Flow --      Pain Score 11/02/17 1115 0     Pain Loc --      Pain Edu? --      Excl. in [place]? --     Constitutional: Alert and oriented. Well appearing and in no apparent distress. HEENT:      Head: Normocephalic and atraumatic.         Eyes: Conjunctivae are normal. Sclera is non-icteric.       Mouth/Throat: Mucous membranes are moist.       Neck: Supple with no signs of meningismus. Cardiovascular: Regular  rate and rhythm. No murmurs, gallops, or rubs. 2+ symmetrical distal pulses are present in all extremities. No JVD. Respiratory: Mildly increased work of breathing, hypoxic on room air, sats improved to the upper 90s on 2 L nasal cannula, patient has crackles on bilateral bases, good air movement with no wheezes  Gastrointestinal: Soft, non tender, and non distended with positive bowel sounds. No rebound or guarding. Musculoskeletal: Asymmetric pitting edema bilateral lower extremity with right greater than left  neurologic: Normal speech and language. Face is symmetric. Moving all extremities. No gross focal neurologic deficits are appreciated. Skin: Skin is warm, dry and intact. No rash noted. Psychiatric: Mood and affect are normal. Speech and behavior are normal.  ____________________________________________   LABS (all labs ordered are listed, but only abnormal results are displayed)  Labs Reviewed  CBC WITH DIFFERENTIAL/PLATELET - Abnormal; Notable for the following components:      Result Value   WBC 13.3 (*)    RDW 17.4 (*)    Neutro Abs 10.5 (*)    Basophils Absolute 0.2 (*)    All other components within normal limits  BASIC METABOLIC PANEL - Abnormal; Notable for the following components:   Glucose, Bld 129 (*)    BUN 21 (*)    Creatinine, Ser 1.15 (*)    GFR calc non Af Amer 43 (*)    GFR calc Af Amer 50 (*)    All other components within normal limits  BRAIN NATRIURETIC PEPTIDE - Abnormal; Notable for the following components:   B Natriuretic Peptide 188.0 (*)    All other components within normal limits  TROPONIN I   ____________________________________________  EKG  ED ECG REPORT I, Rudene Re, the attending physician, personally viewed and interpreted this ECG.  Normal sinus rhythm, rate of 87, normal intervals, normal axis, no ST elevations or depressions, Q waves in anterior leads.  Unchanged from  prior ____________________________________________  RADIOLOGY  I have personally reviewed the images performed during this visit and I agree with the Radiologist's  read.   Interpretation by Radiologist:  Ct Angio Chest Pe W And/or Wo Contrast  Result Date: 11/02/2017 CLINICAL DATA:  Increasing shortness of breath and difficulty breathing. Intermittent episodes of chest pain and back pain. Increased oxygen use. EXAM: CT ANGIOGRAPHY CHEST WITH CONTRAST TECHNIQUE: Multidetector CT imaging of the chest was performed using the standard protocol during bolus administration of intravenous contrast. Multiplanar CT image reconstructions and MIPs were obtained to evaluate the vascular anatomy. CONTRAST:  19mL ISOVUE-370 IOPAMIDOL (ISOVUE-370) INJECTION 76% COMPARISON:  04/18/2017. FINDINGS: Cardiovascular: Negative for pulmonary embolus. Atherosclerotic calcification of the arterial vasculature. Pulmonary arteries and heart are enlarged. Small amount of dependent pericardial fluid may be physiologic. Mediastinum/Nodes: Left thyroid is asymmetrically enlarged. Mediastinal lymph nodes measure up to 1.7 cm in the low right paratracheal station, as before. Bihilar adenopathy measures up to 1.7 cm on the right. No axillary adenopathy. Dilatation of the distal esophagus. Lungs/Pleura: Image quality is degraded by expiratory phase imaging, creating increased density throughout the lungs. Underlying diffuse septal thickening and probable subpleural reticulation, better seen on 04/18/2017. Dependent atelectasis bilaterally. No definite pleural fluid. Airway is otherwise unremarkable. Upper Abdomen: Visualized portions of the liver, adrenal glands right kidney are unremarkable. Subcentimeter low-attenuation lesion in the upper pole left kidney is too small to characterize. Spleen is unremarkable. 9 mm calcified splenic artery aneurysm. Visualized portions of pancreas stomach and bowel are grossly unremarkable. No upper  abdominal adenopathy. Musculoskeletal: Degenerative changes in the spine. Review of the MIP images confirms the above findings. IMPRESSION: 1. Negative for pulmonary embolus. 2. Possible pulmonary edema. Image quality is degraded by end expiration imaging. 3. Underlying subpleural fibrosis, better visualized on 04/18/2017. 4.  Aortic atherosclerosis (ICD10-170.0). 5. Enlarged pulmonary arteries, indicative of pulmonary arterial hypertension. 6. Calcified splenic artery aneurysm. Electronically Signed   By: Lorin Picket M.D.   On: 11/02/2017 14:28   Dg Chest Portable 1 View  Result Date: 11/02/2017 CLINICAL DATA:  Shortness of breath. EXAM: PORTABLE CHEST 1 VIEW COMPARISON:  Radiographs of April 18, 2017. FINDINGS: Stable cardiomediastinal silhouette. Stable diffuse reticular densities are noted throughout both lungs most consistent with chronic interstitial lung disease or scarring, but acute superimposed edema or inflammation cannot be excluded. No pneumothorax or pleural effusion is noted. Bony thorax is unremarkable. IMPRESSION: Stable diffuse reticular densities are noted throughout both lungs most consistent with chronic interstitial lung disease or scarring, but acute superimposed edema or inflammation cannot be excluded. Electronically Signed   By: Marijo Conception, M.D.   On: 11/02/2017 12:35      ____________________________________________   PROCEDURES  Procedure(s) performed: None Procedures Critical Care performed:  None ____________________________________________   INITIAL IMPRESSION / ASSESSMENT AND PLAN / ED COURSE  85 y.o. female with a history of diastolic CHF, COPD on oxygen at home as needed, hypertension, diabetes who presents for evaluation of shortness of breath and CP progressively worse since yesterday.  Patient is in mild respiratory distress, hypoxic on room air, crackles bilaterally with good air movement and no wheezing.  She does have asymmetric leg swelling  however had a ultrasound done 2 weeks ago negative for DVT.  The swelling has been been unchanged.  Patient denies weight gain.  EKG shows no evidence of ischemia or dysrhythmias.  Differential diagnoses includes COPD exacerbation, CHF exacerbation, pneumonia, ACS, PE.    _________________________ 4:06 PM on 11/02/2017 -----------------------------------------  CT angiogram was negative for pulmonary embolism and showed pulmonary edema.  Patient's labs are consistent with a leukocytosis and slightly elevated  BNP of 188.  No signs of pneumonia however with the elevated white count we will treat with antibiotics for possible bronchitis.  Patient reports that she feels improved at this time.  She has received Lasix however has not urinated yet.  She felt markedly improved after 1 DuoNeb.  Will ambulate and she continues to feel improve we will send her home on DuoNeb, steroids, and doxycycline.  Otherwise will admit to the hospitalist service for further diuresis. Care transferred to Dr. Quentin Cornwall.   As part of my medical decision making, I reviewed the following data within the [facility] notes reviewed and incorporated, Labs reviewed , EKG interpreted , Old EKG reviewed, Old chart reviewed, Radiograph reviewed , Notes from prior ED visits and  Controlled Substance Database    Pertinent labs & imaging results that were available during my care of the patient were reviewed by me and considered in my medical decision making (see chart for details).    ____________________________________________   FINAL CLINICAL IMPRESSION(S) / ED DIAGNOSES  Final diagnoses:  Bronchitis  Acute on chronic congestive heart failure, unspecified heart failure type ([place])      NEW MEDICATIONS STARTED DURING THIS VISIT:  ED Discharge Orders        Ordered    albuterol (PROVENTIL HFA;VENTOLIN HFA) 108 (90 Base) MCG/ACT inhaler  Every 6 hours PRN     11/02/17 1608    doxycycline  (VIBRAMYCIN) 100 MG capsule  2 times daily     11/02/17 1608    predniSONE (DELTASONE) 20 MG tablet  Daily     11/02/17 1608       Note:  This document was prepared using Dragon voice recognition software and may include unintentional dictation errors.    Alfred Levins, [place], MD 11/02/17 [phone]

## 2017-11-02 NOTE — ED Notes (Signed)
Pt labored. Sat 84%.  Pulled for triage will obtain room.

## 2017-11-02 NOTE — Discharge Instructions (Addendum)
Please increase your lasix to 40mg  (two pills) for the next two days and follow up with pcp and cardiology.

## 2017-11-03 ENCOUNTER — Telehealth: Payer: Self-pay

## 2017-11-03 NOTE — Telephone Encounter (Signed)
Pt already a patient of [facility]

## 2017-11-03 NOTE — Telephone Encounter (Signed)
Lmov for patient to call back  She was seen in ED on 11/02/17  Will try again at a later time

## 2017-11-30 ENCOUNTER — Emergency Department: Payer: Medicare Other

## 2017-11-30 ENCOUNTER — Other Ambulatory Visit: Payer: Self-pay

## 2017-11-30 ENCOUNTER — Inpatient Hospital Stay
Admission: EM | Admit: 2017-11-30 | Discharge: 2017-12-04 | DRG: 190 | Disposition: A | Discharge status: Home Health | Payer: Medicare Other | Attending: Internal Medicine | Admitting: Internal Medicine

## 2017-11-30 DIAGNOSIS — I110 Hypertensive heart disease with heart failure: Secondary | ICD-10-CM | POA: Diagnosis present

## 2017-11-30 DIAGNOSIS — Z79899 Other long term (current) drug therapy: Secondary | ICD-10-CM | POA: Diagnosis not present

## 2017-11-30 DIAGNOSIS — M1990 Unspecified osteoarthritis, unspecified site: Secondary | ICD-10-CM | POA: Diagnosis present

## 2017-11-30 DIAGNOSIS — Z881 Allergy status to other antibiotic agents status: Secondary | ICD-10-CM | POA: Diagnosis not present

## 2017-11-30 DIAGNOSIS — E1140 Type 2 diabetes mellitus with diabetic neuropathy, unspecified: Secondary | ICD-10-CM | POA: Diagnosis present

## 2017-11-30 DIAGNOSIS — Z66 Do not resuscitate: Secondary | ICD-10-CM | POA: Diagnosis present

## 2017-11-30 DIAGNOSIS — I160 Hypertensive urgency: Secondary | ICD-10-CM | POA: Diagnosis present

## 2017-11-30 DIAGNOSIS — J849 Interstitial pulmonary disease, unspecified: Secondary | ICD-10-CM | POA: Diagnosis present

## 2017-11-30 DIAGNOSIS — J441 Chronic obstructive pulmonary disease with (acute) exacerbation: Secondary | ICD-10-CM | POA: Diagnosis not present

## 2017-11-30 DIAGNOSIS — D72829 Elevated white blood cell count, unspecified: Secondary | ICD-10-CM | POA: Diagnosis present

## 2017-11-30 DIAGNOSIS — Z87891 Personal history of nicotine dependence: Secondary | ICD-10-CM

## 2017-11-30 DIAGNOSIS — Z885 Allergy status to narcotic agent status: Secondary | ICD-10-CM

## 2017-11-30 DIAGNOSIS — Z888 Allergy status to other drugs, medicaments and biological substances status: Secondary | ICD-10-CM

## 2017-11-30 DIAGNOSIS — R0602 Shortness of breath: Secondary | ICD-10-CM | POA: Diagnosis not present

## 2017-11-30 DIAGNOSIS — J9621 Acute and chronic respiratory failure with hypoxia: Secondary | ICD-10-CM | POA: Diagnosis present

## 2017-11-30 DIAGNOSIS — E7800 Pure hypercholesterolemia, unspecified: Secondary | ICD-10-CM | POA: Diagnosis present

## 2017-11-30 DIAGNOSIS — Z96649 Presence of unspecified artificial hip joint: Secondary | ICD-10-CM | POA: Diagnosis present

## 2017-11-30 DIAGNOSIS — T380X5A Adverse effect of glucocorticoids and synthetic analogues, initial encounter: Secondary | ICD-10-CM | POA: Diagnosis present

## 2017-11-30 DIAGNOSIS — H9191 Unspecified hearing loss, right ear: Secondary | ICD-10-CM | POA: Diagnosis present

## 2017-11-30 DIAGNOSIS — I5032 Chronic diastolic (congestive) heart failure: Secondary | ICD-10-CM | POA: Diagnosis present

## 2017-11-30 DIAGNOSIS — Z7982 Long term (current) use of aspirin: Secondary | ICD-10-CM

## 2017-11-30 DIAGNOSIS — Z9981 Dependence on supplemental oxygen: Secondary | ICD-10-CM | POA: Diagnosis not present

## 2017-11-30 DIAGNOSIS — K219 Gastro-esophageal reflux disease without esophagitis: Secondary | ICD-10-CM | POA: Diagnosis present

## 2017-11-30 DIAGNOSIS — R6889 Other general symptoms and signs: Secondary | ICD-10-CM

## 2017-11-30 DIAGNOSIS — F419 Anxiety disorder, unspecified: Secondary | ICD-10-CM | POA: Diagnosis present

## 2017-11-30 DIAGNOSIS — R0902 Hypoxemia: Secondary | ICD-10-CM

## 2017-11-30 DIAGNOSIS — I272 Pulmonary hypertension, unspecified: Secondary | ICD-10-CM | POA: Diagnosis present

## 2017-11-30 DIAGNOSIS — F329 Major depressive disorder, single episode, unspecified: Secondary | ICD-10-CM | POA: Diagnosis present

## 2017-11-30 LAB — BASIC METABOLIC PANEL
ANION GAP: 12 (ref 5–15)
BUN: 22 mg/dL — ABNORMAL HIGH (ref 6–20)
CALCIUM: 9.2 mg/dL (ref 8.9–10.3)
CHLORIDE: 102 mmol/L (ref 101–111)
CO2: 21 mmol/L — AB (ref 22–32)
Creatinine, Ser: 1.35 mg/dL — ABNORMAL HIGH (ref 0.44–1.00)
GFR calc Af Amer: 41 mL/min — ABNORMAL LOW (ref >60)
GFR calc non Af Amer: 35 mL/min — ABNORMAL LOW (ref >60)
GLUCOSE: 149 mg/dL — AB (ref 65–99)
Potassium: 4.3 mmol/L (ref 3.5–5.1)
Sodium: 135 mmol/L (ref 135–145)

## 2017-11-30 LAB — CBC
HEMATOCRIT: 40.6 % (ref 35.0–47.0)
HEMOGLOBIN: 12.9 g/dL (ref 12.0–16.0)
MCH: 26.3 pg (ref 26.0–34.0)
MCHC: 31.8 g/dL — AB (ref 32.0–36.0)
MCV: 82.6 fL (ref 80.0–100.0)
Platelets: 342 10*3/uL (ref 150–440)
RBC: 4.91 MIL/uL (ref 3.80–5.20)
RDW: 16.4 % — ABNORMAL HIGH (ref 11.5–14.5)
WBC: 21.6 10*3/uL — ABNORMAL HIGH (ref 3.6–11.0)

## 2017-11-30 LAB — BRAIN NATRIURETIC PEPTIDE: B Natriuretic Peptide: 252 pg/mL — ABNORMAL HIGH (ref 0.0–100.0)

## 2017-11-30 LAB — TROPONIN I

## 2017-11-30 LAB — GLUCOSE, CAPILLARY: GLUCOSE-CAPILLARY: 142 mg/dL — AB (ref 65–99)

## 2017-11-30 MED ORDER — ACETAMINOPHEN 325 MG PO TABS: 650.0000 mg | ORAL_TABLET | Freq: Four times a day (QID) | ORAL | Status: DC | PRN

## 2017-11-30 MED ORDER — INSULIN ASPART 100 UNIT/ML ~~LOC~~ SOLN
0.0000 [IU] | Freq: Three times a day (TID) | SUBCUTANEOUS | Status: DC
  Administered 2017-12-03: 1 [IU] via SUBCUTANEOUS
  Filled 2017-11-30: qty 1

## 2017-11-30 MED ORDER — LISINOPRIL 5 MG PO TABS
5.0000 mg | ORAL_TABLET | Freq: Every day | ORAL | Status: DC
  Administered 2017-12-01 – 2017-12-03 (×3): 5 mg via ORAL
  Filled 2017-11-30 (×3): qty 1

## 2017-11-30 MED ORDER — ACETAMINOPHEN 650 MG RE SUPP: 650.0000 mg | Freq: Four times a day (QID) | RECTAL | Status: DC | PRN

## 2017-11-30 MED ORDER — DULOXETINE HCL 20 MG PO CPEP
20.0000 mg | ORAL_CAPSULE | Freq: Every day | ORAL | Status: DC
  Administered 2017-12-01 – 2017-12-04 (×4): 20 mg via ORAL
  Filled 2017-11-30 (×4): qty 1

## 2017-11-30 MED ORDER — IOPAMIDOL (ISOVUE-370) INJECTION 76%
60.0000 mL | Freq: Once | INTRAVENOUS | Status: AC | PRN
  Administered 2017-11-30: 60 mL via INTRAVENOUS

## 2017-11-30 MED ORDER — SIMVASTATIN 20 MG PO TABS
20.0000 mg | ORAL_TABLET | Freq: Every day | ORAL | Status: DC
  Administered 2017-12-01 – 2017-12-04 (×4): 20 mg via ORAL
  Filled 2017-11-30 (×4): qty 1

## 2017-11-30 MED ORDER — METOPROLOL SUCCINATE ER 25 MG PO TB24
25.0000 mg | ORAL_TABLET | Freq: Every day | ORAL | Status: DC
  Administered 2017-12-01 – 2017-12-04 (×4): 25 mg via ORAL
  Filled 2017-11-30 (×4): qty 1

## 2017-11-30 MED ORDER — ALBUTEROL SULFATE HFA 108 (90 BASE) MCG/ACT IN AERS: 2.0000 | INHALATION_SPRAY | Freq: Four times a day (QID) | RESPIRATORY_TRACT | Status: DC | PRN

## 2017-11-30 MED ORDER — ALBUTEROL SULFATE (2.5 MG/3ML) 0.083% IN NEBU: 2.5000 mg | INHALATION_SOLUTION | Freq: Four times a day (QID) | RESPIRATORY_TRACT | Status: DC | PRN

## 2017-11-30 MED ORDER — POLYETHYLENE GLYCOL 3350 17 G PO PACK
17.0000 g | PACK | Freq: Every day | ORAL | Status: DC | PRN
  Administered 2017-12-02: 17 g via ORAL
  Filled 2017-11-30: qty 1

## 2017-11-30 MED ORDER — PANTOPRAZOLE SODIUM 40 MG PO TBEC
40.0000 mg | DELAYED_RELEASE_TABLET | Freq: Two times a day (BID) | ORAL | Status: DC
  Administered 2017-11-30 – 2017-12-04 (×8): 40 mg via ORAL
  Filled 2017-11-30 (×8): qty 1

## 2017-11-30 MED ORDER — HYDRALAZINE HCL 25 MG PO TABS
25.0000 mg | ORAL_TABLET | Freq: Three times a day (TID) | ORAL | Status: DC
  Administered 2017-11-30 – 2017-12-01 (×4): 25 mg via ORAL
  Filled 2017-11-30 (×4): qty 1

## 2017-11-30 MED ORDER — TRAMADOL HCL 50 MG PO TABS: 50.0000 mg | ORAL_TABLET | Freq: Four times a day (QID) | ORAL | Status: DC | PRN

## 2017-11-30 MED ORDER — ASPIRIN EC 81 MG PO TBEC
81.0000 mg | DELAYED_RELEASE_TABLET | Freq: Every day | ORAL | Status: DC
  Administered 2017-12-01 – 2017-12-04 (×4): 81 mg via ORAL
  Filled 2017-11-30 (×4): qty 1

## 2017-11-30 MED ORDER — DARIFENACIN HYDROBROMIDE ER 7.5 MG PO TB24
7.5000 mg | ORAL_TABLET | Freq: Every day | ORAL | Status: DC
  Administered 2017-12-01 – 2017-12-04 (×4): 7.5 mg via ORAL
  Filled 2017-11-30 (×4): qty 1

## 2017-11-30 MED ORDER — GABAPENTIN 400 MG PO CAPS
800.0000 mg | ORAL_CAPSULE | Freq: Every day | ORAL | Status: DC
  Administered 2017-11-30 – 2017-12-03 (×4): 800 mg via ORAL
  Filled 2017-11-30 (×4): qty 2

## 2017-11-30 MED ORDER — SENNA 8.6 MG PO TABS
1.0000 | ORAL_TABLET | Freq: Two times a day (BID) | ORAL | Status: DC
  Administered 2017-11-30 – 2017-12-04 (×8): 8.6 mg via ORAL
  Filled 2017-11-30 (×8): qty 1

## 2017-11-30 MED ORDER — FUROSEMIDE 20 MG PO TABS
20.0000 mg | ORAL_TABLET | Freq: Every day | ORAL | Status: DC
  Administered 2017-12-01 – 2017-12-03 (×3): 20 mg via ORAL
  Filled 2017-11-30 (×3): qty 1

## 2017-11-30 MED ORDER — ONDANSETRON HCL 4 MG PO TABS: 4.0000 mg | ORAL_TABLET | Freq: Four times a day (QID) | ORAL | Status: DC | PRN

## 2017-11-30 MED ORDER — VITAMIN B-12 1000 MCG PO TABS
1000.0000 ug | ORAL_TABLET | Freq: Every day | ORAL | Status: DC
  Administered 2017-12-01 – 2017-12-04 (×4): 1000 ug via ORAL
  Filled 2017-11-30 (×4): qty 1

## 2017-11-30 MED ORDER — ENOXAPARIN SODIUM 40 MG/0.4ML ~~LOC~~ SOLN
40.0000 mg | SUBCUTANEOUS | Status: DC
  Administered 2017-11-30 – 2017-12-03 (×4): 40 mg via SUBCUTANEOUS
  Filled 2017-11-30 (×4): qty 0.4

## 2017-11-30 MED ORDER — ONDANSETRON HCL 4 MG/2ML IJ SOLN: 4.0000 mg | Freq: Four times a day (QID) | INTRAMUSCULAR | Status: DC | PRN

## 2017-11-30 NOTE — ED Notes (Signed)
Attempted to call report - nurse unavailable   

## 2017-11-30 NOTE — Clinical Social Work Note (Addendum)
CSW received inappropriate consult for "Needs home O2." CSW discussed with EDP Dr. Mariea Clonts that nurse case management would need to be consulted for this request. CSW provided contact information for [facility] Angela-[phone]. CSW sent message to [facility], also. CSW signing off as no further Social Work needs identified.   Oretha Ellis, Latanya Presser, [place] Worker-Emergency Department [phone]

## 2017-11-30 NOTE — ED Triage Notes (Signed)
Pt to ER via POV for evaluation of SOB. Pt denies CP. States that she has had increase in SOB over last few days. Family unsure if portable oxygen is working at home. Wears 2L [place] chronically.

## 2017-11-30 NOTE — Progress Notes (Signed)
Family Meeting Note  Advance Directive: yes  Today a meeting took place with the daughter and patient The following were discussed:Patient's diagnosis: it is being admitted for acute on chronic COPD exacerbation with hypoxia. She has a stable prognosis. Discuss code status with patient and daughter in the ER patient request DNR, Patient's progosis: fairTime spent during discussion 17 mins  Fritzi Mandes, MD

## 2017-11-30 NOTE — ED Notes (Signed)
Pt advised that she needs to collect a urine specimen at this time

## 2017-11-30 NOTE — Progress Notes (Signed)
Pt arrived from er to room 154. Pt is alert and approp. In bed, assessment done, o2 on at 2l/min [place], oriented to room. Call bell in reach. Instructed to call for needs. Bed alarm on for safety.

## 2017-11-30 NOTE — ED Notes (Signed)
Patient transported to CT 

## 2017-11-30 NOTE — ED Provider Notes (Signed)
[facility] Emergency Department Provider Note  ____________________________________________  Time seen: Approximately 6:14 PM  I have reviewed the triage vital signs and the nursing notes.   HISTORY  Chief Complaint Shortness of Breath    HPI [name] is a 84 y.o. female with a history of COPD who wears O2 as needed, CHF, DM, presenting with shortness of breath and hypoxia.  The patient reports that for the last several weeks she has been feeling more short of breath particularly with exertion.  She states that when she puts her oxygen on, she feels somewhat better but not all the way better.  She went to get a corticosteroid injection of her right shoulder yesterday with Dr. Harlow Mares, and was found to have O2 sats in the 65s; a follow-up appointment was made to see her PMD today, also found the patient to be hypoxic in the 71s and 80s.  The patient denies any other new symptoms.  She has not had any fevers or chills, change in her cough character or severity, lightheadedness or syncope, chest pain, lower extremity swelling or calf pain.  No lightheadedness or syncope.  Past Medical History:  Diagnosis Date  . Anxiety   . Arthritis    hands  . Cancer ([place])   . CHF (congestive heart failure) (HCC)    swelling of feet and legs  . COPD (chronic obstructive pulmonary disease) (HCC)    O2 use at night 2 liters  . DDD (degenerative disc disease), cervical    cervical laminectomy x 2  . Deaf, right   . Depression   . Diabetes mellitus without complication ([place])   . GERD (gastroesophageal reflux disease)   . High cholesterol   . History of hiatal hernia   . Hypertension    controlled on meds  . Neuromuscular disorder (HCC)    neuropathy  . PONV (postoperative nausea and vomiting)   . Shortness of breath dyspnea   . Vertigo    last episode approx 1 yr ago  . Wears dentures    full upper and lower    Patient Active Problem List   Diagnosis Date  Noted  . Acute CHF (congestive heart failure) ([place]) 04/18/2017  . Hypertension 02/16/2017  . Renal artery stenosis ([place]) 02/16/2017  . Acute gastritis without bleeding 02/13/2017    Past Surgical History:  Procedure Laterality Date  . ABDOMINAL HYSTERECTOMY  1963  . APPENDECTOMY    . BACK SURGERY     cervical laminectomy x2  . CATARACT EXTRACTION W/PHACO Right 03/10/2016   Procedure: CATARACT EXTRACTION PHACO AND INTRAOCULAR LENS PLACEMENT (IOC);  Surgeon: Leandrew Koyanagi, MD;  Location: [place];  Service: Ophthalmology;  Laterality: Right;  DIABETIC - oral meds  . CATARACT EXTRACTION W/PHACO Left 04/07/2016   Procedure: CATARACT EXTRACTION PHACO AND INTRAOCULAR LENS PLACEMENT (IOC);  Surgeon: Leandrew Koyanagi, MD;  Location: [place];  Service: Ophthalmology;  Laterality: Left;  DIABETIC, oral med [place]    . ESOPHAGOGASTRODUODENOSCOPY N/A 07/18/2015   Procedure: ESOPHAGOGASTRODUODENOSCOPY (EGD);  Surgeon: Hulen Luster, MD;  Location: [facility] ENDOSCOPY;  Service: Gastroenterology;  Laterality: N/A;  . JOINT REPLACEMENT     hip / also revision  . SHOULDER SURGERY      Current Outpatient Rx  . Order #: [id number] Class: Print  . Order #: [id number] Class: Historical Med  . Order #: [id number] Class: Print  . Order #: [id number] Class: Historical Med  . Order #: [id number] Class: Historical Med  . Order #:  [id number] Class: Historical Med  . Order #: [id number] Class: Historical Med  . Order #: [id number] Class: Normal  . Order #: [id number] Class: Historical Med  . Order #: [id number] Class: Normal  . Order #: [id number] Class: Print  . Order #: [id number] Class: Historical Med  . Order #: [id number] Class: Print  . Order #: [id number] Class: Historical Med  . Order #: [id number] Class: Historical Med  . Order #: [id number] Class: Historical Med  . Order #: [id number] Class: Historical Med  . Order #: [id number] Class: Historical Med    Allergies Ciprofloxacin;  Hydrocodone; Sertraline; and Codeine  No family history on file.  Social History Social History   Tobacco Use  . Smoking status: Former Smoker    Packs/day: 0.25    Years: 20.00    Pack years: 5.00    Types: Cigarettes    Last attempt to quit: 03/14/2000    Years since quitting: 17.7  . Smokeless tobacco: Never Used  Substance Use Topics  . Alcohol use: No  . Drug use: No    Review of Systems Constitutional: No fever/chills.  No lightheadedness or syncope.  Positive exertional shortness of breath.  Positive decreased exercise tolerance. Eyes: No visual changes.  No eye discharge. ENT: No sore throat. No congestion or rhinorrhea. Cardiovascular: Denies chest pain. Denies palpitations. Respiratory: Positive exertional shortness of breath.  Positive hypoxia.  No cough. Gastrointestinal: No abdominal pain.  No nausea, no vomiting.  No diarrhea.  No constipation. Genitourinary: Negative for dysuria. Musculoskeletal: Negative for back pain. Skin: Negative for rash. Neurological: Negative for headaches. No focal numbness, tingling or weakness.     ____________________________________________   PHYSICAL EXAM:  VITAL SIGNS: ED Triage Vitals [11/30/17 1625]  Enc Vitals Group     BP 137/63     Pulse Rate 62     Resp (!) 22     Temp 97.9 F (36.6 C)     Temp Source Oral     SpO2 92 %     Weight 170 lb (77.1 kg)     Height 5\' 4"  (1.626 m)     Head Circumference      Peak Flow      Pain Score 0     Pain Loc      Pain Edu?      Excl. in [place]?     Constitutional: Alert and oriented. Well appearing and in no acute distress. Answers questions appropriately. Eyes: Conjunctivae are normal.  EOMI. No scleral icterus. Head: Atraumatic. Nose: No congestion/rhinnorhea. Mouth/Throat: Mucous membranes are moist.  Neck: No stridor.  Supple.  No JVD.  No meningismus. Cardiovascular: Normal rate, regular rhythm. No murmurs, rubs or gallops.  Respiratory: The patient is satting 94%  on 2 L nasal cannula; when I come in the room I turned it off and she had O2 sats as low as 84% on room air.  She has tachypnea with a prolonged expiratory phase but no significant retractions, no wheezes rales or rhonchi. Gastrointestinal: Soft, nontender and nondistended.  No guarding or rebound.  No peritoneal signs. Musculoskeletal: Mild symmetric LE edema isolated to the bilateral ankles. No ttp in the calves or palpable cords.  Negative Homan's sign. Neurologic:  A&Ox3.  Speech is clear.  Face and smile are symmetric.  EOMI.  Moves all extremities well. Skin:  Skin is warm, dry and intact. No rash noted. Psychiatric: Mood and affect are normal. Speech and behavior are normal.  Normal judgement.  ____________________________________________   LABS (all labs ordered are listed, but only abnormal results are displayed)  Labs Reviewed  BASIC METABOLIC PANEL - Abnormal; Notable for the following components:      Result Value   CO2 21 (*)    Glucose, Bld 149 (*)    BUN 22 (*)    Creatinine, Ser 1.35 (*)    GFR calc non Af Amer 35 (*)    GFR calc Af Amer 41 (*)    All other components within normal limits  CBC - Abnormal; Notable for the following components:   WBC 21.6 (*)    MCHC 31.8 (*)    RDW 16.4 (*)    All other components within normal limits  URINALYSIS, COMPLETE (UACMP) WITH MICROSCOPIC  TROPONIN I  BRAIN NATRIURETIC PEPTIDE   ____________________________________________  EKG  ED ECG REPORT I, Eula Listen, the attending physician, personally viewed and interpreted this ECG.   Date: 11/30/2017  EKG Time: 1623  Rate: 61  Rhythm: normal sinus rhythm  Axis: normal  Intervals:none  ST&T Change: No STEMI  ____________________________________________  RADIOLOGY  Dg Chest 2 View  Result Date: 11/30/2017 CLINICAL DATA:  Pt to ER via POV for evaluation of SOB. Pt denies CP. States that she has had increase in SOB over last few days. Family unsure if  portable oxygen is working at home. Wears 2L [place] chronicallyCHF, COPD, diabetes, EXAM: CHEST - 2 VIEW COMPARISON:  Chest CT and chest radiographs, 11/02/2017. Chest radiograph, 04/18/2017. FINDINGS: Cardiac silhouette is normal in size. No mediastinal or hilar masses. No convincing adenopathy. Bilateral irregular interstitial thickening is similar to the exam dated 04/18/2017. Findings are consistent with chronic interstitial fibrosis. No convincing pneumonia or pulmonary edema. No pleural effusion or pneumothorax. Skeletal structures are demineralized. There are findings consistent with bilateral prior shoulder surgery as well as changes from previous anterior posterior neck fusion surgery, stable from the prior studies. IMPRESSION: 1. No acute cardiopulmonary disease. 2. Chronic lung changes consistent with interstitial lung disease. Electronically Signed   By: Lajean Manes M.D.   On: 11/30/2017 16:47    ____________________________________________   PROCEDURES  Procedure(s) performed: None  Procedures  Critical Care performed: No ____________________________________________   INITIAL IMPRESSION / ASSESSMENT AND PLAN / ED COURSE  Pertinent labs & imaging results that were available during my care of the patient were reviewed by me and considered in my medical decision making (see chart for details).  84 y.o. female with a history of COPD and as needed oxygen use presenting with increasing exertional shortness of breath, decreased exercise tolerance, and hypoxia on room air.  Overall, it is possible and likely that the patient's symptoms are due to progressive COPD disease.  She does not have any signs or symptoms of pneumonia and her chest x-ray does not show an infiltrate.  PE is possible but less likely; a CT scan has been ordered for further evaluation.  The patient is not anemic, is not having an arrhythmia, and ACS or MI is much less likely.  Have spoken to the social worker as well as  the case Arts development officer, who reports that in order to be eligible for oxygen delivery to the patient's home, she has to have an inpatient admission.  I will admit the patient to the hospitalist at this time.  ____________________________________________  FINAL CLINICAL IMPRESSION(S) / ED DIAGNOSES  Final diagnoses:  SOB (shortness of breath)  Hypoxia  Decreased exercise tolerance         NEW MEDICATIONS STARTED DURING THIS VISIT:  New Prescriptions   No medications on file  Eula Listen, MD 11/30/17 [id number]

## 2017-11-30 NOTE — ED Notes (Signed)
Report Manufacturing systems engineer

## 2017-11-30 NOTE — H&P (Signed)
[place] [facility] [place] NAME: [name]    MR#:  [id number]  DATE OF BIRTH:  03-15-1933  DATE OF ADMISSION:  11/30/2017  PRIMARY CARE PHYSICIAN: Katheren Shams   REQUESTING/REFERRING PHYSICIAN: Dr. Mariea Clonts  CHIEF COMPLAINT:   Shortness of breath and low oxygen saturation's HISTORY OF PRESENT ILLNESS:  [name]  is a 84 y.o. female with a known history of COPD who wears O2 as needed, CHF, DM, presenting with shortness of breath and hypoxia.  The patient reports that for the last several weeks she has been feeling more short of breath particularly with exertion.  She states that when she puts her oxygen on, she feels somewhat better but not all the way better.  She went to get a corticosteroid injection of her right shoulder yesterday with Dr. Harlow Mares, and was found to have O2 sats in the 52s; a follow-up appointment was made to see her PMD today, also found the patient to be hypoxic in the 39s and 80s.  The patient denies any other new symptoms.  She has not had any fevers or chills, change in her cough character or severity.  It is being admitted for further evaluation of management. She received some IV Solu-Medrol in the ER. CT chest done in the ER negative for PE  PAST MEDICAL HISTORY:   Past Medical History:  Diagnosis Date  . Anxiety   . Arthritis    hands  . Cancer ([place])   . CHF (congestive heart failure) (HCC)    swelling of feet and legs  . COPD (chronic obstructive pulmonary disease) (HCC)    O2 use at night 2 liters  . DDD (degenerative disc disease), cervical    cervical laminectomy x 2  . Deaf, right   . Depression   . Diabetes mellitus without complication ([place])   . GERD (gastroesophageal reflux disease)   . High cholesterol   . History of hiatal hernia   . Hypertension    controlled on meds  . Neuromuscular disorder (HCC)    neuropathy  . PONV (postoperative nausea and vomiting)   . Shortness of  breath dyspnea   . Vertigo    last episode approx 1 yr ago  . Wears dentures    full upper and lower    PAST SURGICAL HISTOIRY:   Past Surgical History:  Procedure Laterality Date  . ABDOMINAL HYSTERECTOMY  1963  . APPENDECTOMY    . BACK SURGERY     cervical laminectomy x2  . CATARACT EXTRACTION W/PHACO Right 03/10/2016   Procedure: CATARACT EXTRACTION PHACO AND INTRAOCULAR LENS PLACEMENT (IOC);  Surgeon: Leandrew Koyanagi, MD;  Location: [place];  Service: Ophthalmology;  Laterality: Right;  DIABETIC - oral meds  . CATARACT EXTRACTION W/PHACO Left 04/07/2016   Procedure: CATARACT EXTRACTION PHACO AND INTRAOCULAR LENS PLACEMENT (IOC);  Surgeon: Leandrew Koyanagi, MD;  Location: [place];  Service: Ophthalmology;  Laterality: Left;  DIABETIC, oral med [place]    . ESOPHAGOGASTRODUODENOSCOPY N/A 07/18/2015   Procedure: ESOPHAGOGASTRODUODENOSCOPY (EGD);  Surgeon: Hulen Luster, MD;  Location: [facility] ENDOSCOPY;  Service: Gastroenterology;  Laterality: N/A;  . JOINT REPLACEMENT     hip / also revision  . SHOULDER SURGERY      SOCIAL HISTORY:   Social History   Tobacco Use  . Smoking status: Former Smoker    Packs/day: 0.25    Years: 20.00    Pack years: 5.00    Types:  Cigarettes    Last attempt to quit: 03/14/2000    Years since quitting: 17.7  . Smokeless tobacco: Never Used  Substance Use Topics  . Alcohol use: No    FAMILY HISTORY:  No family history on file.  DRUG ALLERGIES:   Allergies  Allergen Reactions  . Ciprofloxacin Nausea And Vomiting  . Hydrocodone Nausea Only  . Sertraline Nausea And Vomiting  . Codeine Nausea And Vomiting    REVIEW OF SYSTEMS:  Review of Systems  Constitutional: Negative for chills, fever and weight loss.  HENT: Negative for ear discharge, ear pain and nosebleeds.   Eyes: Negative for blurred vision, pain and discharge.  Respiratory: Positive for shortness of breath. Negative for sputum  production, wheezing and stridor.   Cardiovascular: Negative for chest pain, palpitations, orthopnea and PND.  Gastrointestinal: Negative for abdominal pain, diarrhea, nausea and vomiting.  Genitourinary: Negative for frequency and urgency.  Musculoskeletal: Negative for back pain and joint pain.  Neurological: Negative for sensory change, speech change, focal weakness and weakness.  Psychiatric/Behavioral: Negative for depression and hallucinations. The patient is not nervous/anxious.      MEDICATIONS AT HOME:   Prior to Admission medications   Medication Sig Start Date End Date Taking? Authorizing Provider  albuterol (PROVENTIL HFA;VENTOLIN HFA) 108 (90 Base) MCG/ACT inhaler Inhale 2 puffs into the lungs every 6 (six) hours as needed for wheezing or shortness of breath. 11/02/17  Yes Alfred Levins, [place], MD  aspirin EC 81 MG tablet Take 81 mg by mouth daily. breakfast   Yes [provider]  butalbital-acetaminophen-caffeine (FIORICET, ESGIC) 50-325-40 MG tablet Take 1 tablet by mouth every 6 (six) hours as needed for headache or migraine. 02/17/17  Yes Max Sane, MD  clobetasol cream (TEMOVATE) 9.44 % Apply 1 application as needed topically.   Yes [provider]  cyanocobalamin 1000 MCG tablet Take 1,000 mcg by mouth daily. breakfast   Yes [provider]  DULoxetine (CYMBALTA) 20 MG capsule Take 20 mg daily by mouth.   Yes [provider]  furosemide (LASIX) 20 MG tablet Take 20 mg by mouth daily. May take second dose if needed for swelling.   Yes [provider]  gabapentin (NEURONTIN) 400 MG capsule Take 1 capsule (400 mg total) by mouth at bedtime. Patient taking differently: Take 800 mg at bedtime by mouth.  03/15/13  Yes Regal, Tamala Fothergill, DPM  hydrALAZINE (APRESOLINE) 25 MG tablet Take 1 tablet (25 mg total) 3 (three) times daily by mouth. 04/19/17  Yes Sudini, Alveta Heimlich, MD  lisinopril (PRINIVIL,ZESTRIL) 5 MG tablet Take 1 tablet (5 mg total) by  mouth daily. 08/08/16 11/30/17 Yes Delman Kitten, MD  metoprolol succinate (TOPROL-XL) 25 MG 24 hr tablet Take 25 mg daily by mouth.   Yes [provider]  pantoprazole (PROTONIX) 40 MG tablet Take 40 mg 2 (two) times daily by mouth.    Yes [provider]  simvastatin (ZOCOR) 20 MG tablet Take 20 mg by mouth daily.    Yes [provider]  solifenacin (VESICARE) 10 MG tablet Take 10 mg by mouth daily. breakfast   Yes [provider]  traMADol (ULTRAM) 50 MG tablet Take every 6 (six) hours as needed by mouth.   Yes [provider]      VITAL SIGNS:  Blood pressure (!) 146/91, pulse (!) 56, temperature 97.9 F (36.6 C), temperature source Oral, resp. rate 20, height 5\' 4"  (1.626 m), weight 77.1 kg (170 lb), SpO2 95 %.  PHYSICAL EXAMINATION:  GENERAL:  84 y.o.-year-old patient lying in the bed with no acute distress.  EYES: Pupils equal, round, reactive to light and accommodation. No scleral icterus. Extraocular muscles intact.  HEENT: Head atraumatic, normocephalic. Oropharynx and nasopharynx clear.  NECK:  Supple, no jugular venous distention. No thyroid enlargement, no tenderness.  LUNGS: Normal breath sounds bilaterally, no wheezing, rales,rhonchi or crepitation. No use of accessory muscles of respiration.  CARDIOVASCULAR: S1, S2 normal. No murmurs, rubs, or gallops.  ABDOMEN: Soft, nontender, nondistended. Bowel sounds present. No organomegaly or mass.  EXTREMITIES: No pedal edema, cyanosis, or clubbing.  NEUROLOGIC: Cranial nerves II through XII are intact. Muscle strength 5/5 in all extremities. Sensation intact. Gait not checked.  PSYCHIATRIC: The patient is alert and oriented x 3.  SKIN: No obvious rash, lesion, or ulcer.   LABORATORY PANEL:   CBC Recent Labs  Lab 11/30/17 1628  WBC 21.6*  HGB 12.9  HCT 40.6  PLT 342    ------------------------------------------------------------------------------------------------------------------  Chemistries  Recent Labs  Lab 11/30/17 1628  NA 135  K 4.3  CL 102  CO2 21*  GLUCOSE 149*  BUN 22*  CREATININE 1.35*  CALCIUM 9.2   ------------------------------------------------------------------------------------------------------------------  Cardiac Enzymes Recent Labs  Lab 11/30/17 1628  TROPONINI <0.03   ------------------------------------------------------------------------------------------------------------------  RADIOLOGY:  Dg Chest 2 View  Result Date: 11/30/2017 CLINICAL DATA:  Pt to ER via POV for evaluation of SOB. Pt denies CP. States that she has had increase in SOB over last few days. Family unsure if portable oxygen is working at home. Wears 2L [place] chronicallyCHF, COPD, diabetes, EXAM: CHEST - 2 VIEW COMPARISON:  Chest CT and chest radiographs, 11/02/2017. Chest radiograph, 04/18/2017. FINDINGS: Cardiac silhouette is normal in size. No mediastinal or hilar masses. No convincing adenopathy. Bilateral irregular interstitial thickening is similar to the exam dated 04/18/2017. Findings are consistent with chronic interstitial fibrosis. No convincing pneumonia or pulmonary edema. No pleural effusion or pneumothorax. Skeletal structures are demineralized. There are findings consistent with bilateral prior shoulder surgery as well as changes from previous anterior posterior neck fusion surgery, stable from the prior studies. IMPRESSION: 1. No acute cardiopulmonary disease. 2. Chronic lung changes consistent with interstitial lung disease. Electronically Signed   By: Lajean Manes M.D.   On: 11/30/2017 16:47   Ct Angio Chest Pe W And/or Wo Contrast  Result Date: 11/30/2017 CLINICAL DATA:  Worsening shortness of breath for a few weeks, hypoxic. History of oxygen-dependent COPD, CHF, back surgery. At EXAM: CT ANGIOGRAPHY CHEST WITH CONTRAST TECHNIQUE:  Multidetector CT imaging of the chest was performed using the standard protocol during bolus administration of intravenous contrast. Multiplanar CT image reconstructions and MIPs were obtained to evaluate the vascular anatomy. CONTRAST:  70mL ISOVUE-370 IOPAMIDOL (ISOVUE-370) INJECTION 76% COMPARISON:  Chest radiograph November 30, 2017 and CT chest Nov 02, 2017 FINDINGS: Respiratory motion degraded examination. CARDIOVASCULAR: Adequate contrast opacification of the pulmonary artery's. Main pulmonary artery is not enlarged. No pulmonary arterial filling defects to the level of the subsegmental branches. Heart size is upper limits of normal. Small to moderate unchanged pericardial effusion. Thoracic aorta is normal course and caliber, severe intimal thickening calcific atherosclerosis. MEDIASTINUM/NODES: Stable mild mediastinal lymphadenopathy measuring to 14 mm LUNGS/PLEURA: Tracheobronchial tree is patent, no pneumothorax. Diffuse interstitial prominence with interlobular septal thickening. Faint ground-glass opacity decreased from prior examination. Punctate parenchymal calcifications. No honeycomb or subpleural space bearing. UPPER ABDOMEN: Nonacute. Status post cholecystectomy calcified granuloma dome of liver. It is small hiatal hernia with esophageal air-fluid level. MUSCULOSKELETAL: Nonacute. ACDF.  Thyromegaly with substernal extent. Punctate breast calcifications. Mild LEFT glenohumeral osteoarthrosis. Moderate T1 to spondylosis. Review of the MIP images confirms the above findings. findings IMPRESSION: 1. No pulmonary embolism. 2. Stable interstitial prominence compatible with chronic interstitial lung disease. Decreased ground-glass opacities/edema from prior CT. Aortic Atherosclerosis (ICD10-I70.0). Electronically Signed   By: Elon Alas M.D.   On: 11/30/2017 19:01    EKG:    IMPRESSION AND PLAN:   [name]  is a 84 y.o. female with a known history of COPD who wears O2 as needed, CHF, DM,  presenting with shortness of breath and hypoxia.  The patient reports that for the last several weeks she has been feeling more short of breath particularly with exertion.  She states that when she puts her oxygen on, she feels somewhat better but not all the way better.   1. acute on chronic COPD exacerbation mild with hypoxia -admit to medical floor. -Patient received dose of IV Solu-Medrol in the ER. Continue inhalers and nebulizer. -No indication for antibiotic. Chest x-ray appears consistent with chronic changes of COPD -the patient is not wheezing. She is feeling at baseline. I will hold off on prednisone taper -care management to assess and help with oxygen at home.  2. diabetes continue home meds and sliding scale insulin  3. Hypertension continue home meds  4. Jerrye Bushy continue PPI  5. DVT prophylaxis subcu Lovenox  This with daughter in the room All the records are reviewed and case discussed with ED provider. Management plans discussed with the patient, family and they are in agreement.  CODE STATUS: DNR TOTAL TIME TAKING CARE OF THIS PATIENT: *50* minutes.    Fritzi Mandes M.D on 11/30/2017 at 7:19 PM  Between 7am to 6pm - Pager - [phone]  After 6pm go to [email or web address] - password [email or web address] [facility]  [facility]  Office  [phone]  CC: Primary care physician; Katheren Shams

## 2017-12-01 LAB — URINALYSIS, COMPLETE (UACMP) WITH MICROSCOPIC
Bacteria, UA: NONE SEEN
Bilirubin Urine: NEGATIVE
GLUCOSE, UA: NEGATIVE mg/dL
Hgb urine dipstick: NEGATIVE
Ketones, ur: NEGATIVE mg/dL
Leukocytes, UA: NEGATIVE
NITRITE: NEGATIVE
PH: 6 (ref 5.0–8.0)
Protein, ur: NEGATIVE mg/dL
Specific Gravity, Urine: 1.019 (ref 1.005–1.030)

## 2017-12-01 LAB — GLUCOSE, CAPILLARY
GLUCOSE-CAPILLARY: 76 mg/dL (ref 65–99)
GLUCOSE-CAPILLARY: 88 mg/dL (ref 65–99)
GLUCOSE-CAPILLARY: 87 mg/dL (ref 65–99)
Glucose-Capillary: 85 mg/dL (ref 65–99)

## 2017-12-01 MED ORDER — HYDRALAZINE HCL 25 MG PO TABS
25.0000 mg | ORAL_TABLET | Freq: Once | ORAL | Status: AC
  Administered 2017-12-01: 25 mg via ORAL
  Filled 2017-12-01: qty 1

## 2017-12-01 MED ORDER — HYDRALAZINE HCL 50 MG PO TABS
50.0000 mg | ORAL_TABLET | Freq: Three times a day (TID) | ORAL | Status: DC
Start: 2017-12-02 — End: 2017-12-04
  Administered 2017-12-02 – 2017-12-03 (×6): 50 mg via ORAL
  Filled 2017-12-01 (×6): qty 1

## 2017-12-01 MED ORDER — HYDRALAZINE HCL 20 MG/ML IJ SOLN
10.0000 mg | Freq: Four times a day (QID) | INTRAMUSCULAR | Status: DC | PRN
  Administered 2017-12-02 – 2017-12-03 (×2): 10 mg via INTRAVENOUS
  Filled 2017-12-01 (×2): qty 1

## 2017-12-01 NOTE — Progress Notes (Signed)
Assessment done. Pt awake in bed reading a book. No distress noted. No c/o offered. Call bell in reach, instructed to call for needs. Bed alarm on.

## 2017-12-01 NOTE — Progress Notes (Signed)
Pt has slept in long intervals this shift. Bed alarm on, but has used call bell at times to get up to br. amb to br with use of walker without event. Call bell in reach. No acute resp distress.

## 2017-12-01 NOTE — Progress Notes (Signed)
Sleeping on rounds. Bed alarm on, no resp distress. Call bell in reach.

## 2017-12-01 NOTE — Progress Notes (Signed)
[facility] [place] NAME: [name]    MR#:  [id number]  DATE OF BIRTH:  08-03-1932  SUBJECTIVE:  CHIEF COMPLAINT:  Sob slightly better , still coughing  REVIEW OF SYSTEMS:  CONSTITUTIONAL: No fever, fatigue or weakness.  EYES: No blurred or double vision.  EARS, NOSE, AND THROAT: No tinnitus or ear pain.  RESPIRATORY: cough, shortness of breath, wheezing , no hemoptysis.  CARDIOVASCULAR: No chest pain, orthopnea, edema.  GASTROINTESTINAL: No nausea, vomiting, diarrhea or abdominal pain.  GENITOURINARY: No dysuria, hematuria.  ENDOCRINE: No polyuria, nocturia,  HEMATOLOGY: No anemia, easy bruising or bleeding SKIN: No rash or lesion. MUSCULOSKELETAL: No joint pain or arthritis.   NEUROLOGIC: No tingling, numbness, weakness.  PSYCHIATRY: No anxiety or depression.   DRUG ALLERGIES:   Allergies  Allergen Reactions  . Ciprofloxacin Nausea And Vomiting  . Hydrocodone Nausea Only  . Sertraline Nausea And Vomiting  . Codeine Nausea And Vomiting    VITALS:  Blood pressure (!) 175/87, pulse 67, temperature 97.9 F (36.6 C), temperature source Oral, resp. rate 20, height 5\' 4"  (1.626 m), weight 81 kg (178 lb 9.6 oz), SpO2 96 %.  PHYSICAL EXAMINATION:  GENERAL:  84 y.o.-year-old patient lying in the bed with no acute distress.  EYES: Pupils equal, round, reactive to light and accommodation. No scleral icterus. Extraocular muscles intact.  HEENT: Head atraumatic, normocephalic. Oropharynx and nasopharynx clear.  NECK:  Supple, no jugular venous distention. No thyroid enlargement, no tenderness.  LUNGS: mofd  breath sounds bilaterally,  wheezing, no rales,rhonchi or crepitation. No use of accessory muscles of respiration.  CARDIOVASCULAR: S1, S2 normal. No murmurs, rubs, or gallops.  ABDOMEN: Soft, nontender, nondistended. Bowel sounds present. No organomegaly or mass.  EXTREMITIES: No pedal edema, cyanosis, or clubbing.   NEUROLOGIC: Cranial nerves II through XII are intact. Muscle strength 5/5 in all extremities. Sensation intact. Gait not checked.  PSYCHIATRIC: The patient is alert and oriented x 3.  SKIN: No obvious rash, lesion, or ulcer.    LABORATORY PANEL:   CBC Recent Labs  Lab 11/30/17 1628  WBC 21.6*  HGB 12.9  HCT 40.6  PLT 342   ------------------------------------------------------------------------------------------------------------------  Chemistries  Recent Labs  Lab 11/30/17 1628  NA 135  K 4.3  CL 102  CO2 21*  GLUCOSE 149*  BUN 22*  CREATININE 1.35*  CALCIUM 9.2   ------------------------------------------------------------------------------------------------------------------  Cardiac Enzymes Recent Labs  Lab 11/30/17 1628  TROPONINI <0.03   ------------------------------------------------------------------------------------------------------------------  RADIOLOGY:  Dg Chest 2 View  Result Date: 11/30/2017 CLINICAL DATA:  Pt to ER via POV for evaluation of SOB. Pt denies CP. States that she has had increase in SOB over last few days. Family unsure if portable oxygen is working at home. Wears 2L [place] chronicallyCHF, COPD, diabetes, EXAM: CHEST - 2 VIEW COMPARISON:  Chest CT and chest radiographs, 11/02/2017. Chest radiograph, 04/18/2017. FINDINGS: Cardiac silhouette is normal in size. No mediastinal or hilar masses. No convincing adenopathy. Bilateral irregular interstitial thickening is similar to the exam dated 04/18/2017. Findings are consistent with chronic interstitial fibrosis. No convincing pneumonia or pulmonary edema. No pleural effusion or pneumothorax. Skeletal structures are demineralized. There are findings consistent with bilateral prior shoulder surgery as well as changes from previous anterior posterior neck fusion surgery, stable from the prior studies. IMPRESSION: 1. No acute cardiopulmonary disease. 2. Chronic lung changes consistent with interstitial  lung disease. Electronically Signed   By: Lajean Manes M.D.   On: 11/30/2017  16:47   Ct Angio Chest Pe W And/or Wo Contrast  Result Date: 11/30/2017 CLINICAL DATA:  Worsening shortness of breath for a few weeks, hypoxic. History of oxygen-dependent COPD, CHF, back surgery. At EXAM: CT ANGIOGRAPHY CHEST WITH CONTRAST TECHNIQUE: Multidetector CT imaging of the chest was performed using the standard protocol during bolus administration of intravenous contrast. Multiplanar CT image reconstructions and MIPs were obtained to evaluate the vascular anatomy. CONTRAST:  28mL ISOVUE-370 IOPAMIDOL (ISOVUE-370) INJECTION 76% COMPARISON:  Chest radiograph November 30, 2017 and CT chest Nov 02, 2017 FINDINGS: Respiratory motion degraded examination. CARDIOVASCULAR: Adequate contrast opacification of the pulmonary artery's. Main pulmonary artery is not enlarged. No pulmonary arterial filling defects to the level of the subsegmental branches. Heart size is upper limits of normal. Small to moderate unchanged pericardial effusion. Thoracic aorta is normal course and caliber, severe intimal thickening calcific atherosclerosis. MEDIASTINUM/NODES: Stable mild mediastinal lymphadenopathy measuring to 14 mm LUNGS/PLEURA: Tracheobronchial tree is patent, no pneumothorax. Diffuse interstitial prominence with interlobular septal thickening. Faint ground-glass opacity decreased from prior examination. Punctate parenchymal calcifications. No honeycomb or subpleural space bearing. UPPER ABDOMEN: Nonacute. Status post cholecystectomy calcified granuloma dome of liver. It is small hiatal hernia with esophageal air-fluid level. MUSCULOSKELETAL: Nonacute. ACDF. Thyromegaly with substernal extent. Punctate breast calcifications. Mild LEFT glenohumeral osteoarthrosis. Moderate T1 to spondylosis. Review of the MIP images confirms the above findings. findings IMPRESSION: 1. No pulmonary embolism. 2. Stable interstitial prominence compatible with  chronic interstitial lung disease. Decreased ground-glass opacities/edema from prior CT. Aortic Atherosclerosis (ICD10-I70.0). Electronically Signed   By: Elon Alas M.D.   On: 11/30/2017 19:01    EKG:   Orders placed or performed during the hospital encounter of 11/30/17  . ED EKG  . ED EKG  . EKG 12-Lead  . EKG 12-Lead    ASSESSMENT AND PLAN:   Zoeya Gramajo  is a 84 y.o. female with a known history of COPD who wears O2 as needed, CHF, DM, presenting with shortness of breath and hypoxia. The patient reports that for the last several weeks she has been feeling more short of breath particularly with exertion. She states that when she puts her oxygen on, she feels somewhat better but not all the way better.   1. acute on chronic COPD exacerbation mild with hypoxia - IV Solu-Medrol. Continue inhalers and nebulizer. -No indication for antibiotic. Chest x-ray appears consistent with chronic changes of COPD - ambulatory pox -care management to assess and help with oxygen at home.  2. diabetes continue home meds and sliding scale insulin  3. Hypertension urgency continue home meds, increase hydralazine to 50 mg  4. Jerrye Bushy continue PPI  5. Leukocytosis - sec to steroids   DVT prophylaxis subcu Lovenox       All the records are reviewed and case discussed with Care Management/Social Workerr. Management plans discussed with the patient, family and they are in agreement.  CODE STATUS: fc  TOTAL TIME TAKING CARE OF THIS PATIENT: 36  minutes.   POSSIBLE D/C IN 1-2  DAYS, DEPENDING ON CLINICAL CONDITION.  Note: This dictation was prepared with Dragon dictation along with smaller phrase technology. Any transcriptional errors that result from this process are unintentional.   Nicholes Mango M.D on 12/01/2017 at 9:27 PM  Between 7am to 6pm - Pager - [phone] After 6pm go to [email or web address] - password [email or web address] [facility]  [facility]  Office   [phone]  CC: Primary care physician; Katheren Shams

## 2017-12-02 LAB — GLUCOSE, CAPILLARY
GLUCOSE-CAPILLARY: 91 mg/dL (ref 65–99)
Glucose-Capillary: 118 mg/dL — ABNORMAL HIGH (ref 65–99)
Glucose-Capillary: 109 mg/dL — ABNORMAL HIGH (ref 65–99)
Glucose-Capillary: 152 mg/dL — ABNORMAL HIGH (ref 65–99)

## 2017-12-02 MED ORDER — METHYLPREDNISOLONE SODIUM SUCC 40 MG IJ SOLR
40.0000 mg | Freq: Every day | INTRAMUSCULAR | Status: DC
  Administered 2017-12-02 – 2017-12-04 (×3): 40 mg via INTRAVENOUS
  Filled 2017-12-02 (×3): qty 1

## 2017-12-02 NOTE — Care Management Note (Signed)
Case Management Note  Patient Details  Name: [name] MRN: [id number] Date of Birth: [birth year]  Subjective/Objective:   Spoke with daughter, [name]. She states patient lives at home with her son. She uses a Corporate investment banker but is independent other wise. Patient on home O2. Daughter or patient does not know who it is through but states it does not work and her portable tanks doe not work. Daughter ask that I make a referral to [facility] for new home O2 and she would cancel hers tonight. Offered home health services for RN and PT. Daughter chose [facility]. Referral to [facility] with [facility] for RN and PT  Update: Spoke with [name]: O2 is through [employer]. Updated daughter. Patient has made 29/36 payments. Instructed daughter to call [employer] today and have them come and bring new concentrator and new portable tanks. Provided RNCM contact numbers if she had any difficulties.                  Action/Plan:   Expected Discharge Date:                  Expected Discharge Plan:  [place]  In-House Referral:     Discharge planning Services  CM Consult  Post Acute Care Choice:  Home Health Choice offered to:  Adult Children  DME Arranged:    DME Agency:     HH Arranged:  RN, PT [place] Agency:  [facility]  Status of Service:  In process, will continue to follow  If discussed at Long Length of Stay Meetings, dates discussed:    Additional Comments:  Kristy Mango, RN 12/02/2017, 4:11 PM

## 2017-12-02 NOTE — Evaluation (Signed)
Physical Therapy Evaluation Patient Details Name: [name] MRN: [id number] DOB: 1932/12/20 Today's Date: 12/02/2017   History of Present Illness  presented to ER secondary to SOB, hypoxia; admitted with acute/chronic respiratory failure due to COPD exacerbation.  Clinical Impression  Upon evaluation, patient alert and oriented; follows all commands and demonstrates good effort with all functional activities.  Bilat UE/LE strength and ROM grossly symmetrical and WFL; no focal weakness, pain reported.  Able to complete bed mobility indep; sit/stand, basic transfers and gait (190') with RW.  Decreased cadence, shuffling steps; mod reliance on RW for external support and energy conservation.  Mild SOB with desat to 85-86% after 60-75'; recovers >90% with 15-20 seconds standing rest and pursed lip breathing (requiring 4L supplemental O2 with exertion). Would benefit from skilled PT to address above deficits and promote optimal return to PLOF; Recommend transition to [place] upon discharge from acute hospitalization.     Follow Up Recommendations Home health PT    Equipment Recommendations  (has RW, 4WRW in home environment)    Recommendations for Other Services       Precautions / Restrictions Precautions Precautions: Fall Restrictions Weight Bearing Restrictions: No      Mobility  Bed Mobility Overal bed mobility: Independent                Transfers Overall transfer level: Needs assistance Equipment used: Rolling walker (2 wheeled) Transfers: Sit to/from Stand Sit to Stand: Min guard;Supervision            Ambulation/Gait Ambulation/Gait assistance: Min Gaffer (Feet): 180 Feet Assistive device: Rolling walker (2 wheeled)       General Gait Details: forward flexed posture, shuffling steps with decreased cadence and overall gait speed.  Mild SOB with desat to 85-86% after 60-75'; recovers >90% with 15-20 seconds standing rest and pursed  lip breathing; does require 4L throughout gait trial.  Stairs            Wheelchair Mobility    Modified Rankin (Stroke Patients Only)       Balance Overall balance assessment: Needs assistance Sitting-balance support: Feet supported;No upper extremity supported Sitting balance-Leahy Scale: Good     Standing balance support: Bilateral upper extremity supported Standing balance-Leahy Scale: Fair                               Pertinent Vitals/Pain Pain Assessment: No/denies pain    Home Living Family/patient expects to be discharged to:: Private residence Living Arrangements: Children Available Help at Discharge: Family(son, works outside of home) Type of Home: House Home Access: Stairs to enter Entrance Stairs-Rails: Right;Left;Can reach both Technical brewer of Steps: 5 Home Layout: One level        Prior Function Level of Independence: Independent         Comments: Indep with ADLs, household mobility without assist device; SPC vs. 4WRW for limited community distancse.  Intermittent use of O2 within the home.  Denies fall history.     Hand Dominance        Extremity/Trunk Assessment   Upper Extremity Assessment Upper Extremity Assessment: Overall WFL for tasks assessed    Lower Extremity Assessment Lower Extremity Assessment: Overall WFL for tasks assessed(grossly at least 4/5 throughout)       Communication   Communication: No difficulties  Cognition Arousal/Alertness: Awake/alert Behavior During Therapy: WFL for tasks assessed/performed Overall Cognitive Status: Within Functional Limits for tasks assessed  General Comments      Exercises Other Exercises Other Exercises: Educated in pursed lip breathing, activity pacing and energy conservation; review signs/symptoms of fatigue and decreased oxygen (encouraging intermittent rest periods when noted).  Patient vocied  understanding; will reinforce throughout stay.   Assessment/Plan    PT Assessment Patient needs continued PT services  PT Problem List Decreased activity tolerance;Decreased balance;Decreased mobility;Cardiopulmonary status limiting activity       PT Treatment Interventions DME instruction;Gait training;Stair training;Functional mobility training;Therapeutic activities;Therapeutic exercise;Balance training;Patient/family education    PT Goals (Current goals can be found in the Care Plan section)  Acute Rehab PT Goals Patient Stated Goal: to return home PT Goal Formulation: With patient Time For Goal Achievement: 12/16/17 Potential to Achieve Goals: Good    Frequency Min 2X/week   Barriers to discharge Decreased caregiver support      Co-evaluation               AM-PAC PT "6 Clicks" Daily Activity  Outcome Measure Difficulty turning over in bed (including adjusting bedclothes, sheets and blankets)?: None Difficulty moving from lying on back to sitting on the side of the bed? : None Difficulty sitting down on and standing up from a chair with arms (e.g., wheelchair, bedside commode, etc,.)?: None Help needed moving to and from a bed to chair (including a wheelchair)?: A Little Help needed walking in hospital room?: A Little Help needed climbing 3-5 steps with a railing? : A Little 6 Click Score: 21    End of Session Equipment Utilized During Treatment: Gait belt;Oxygen Activity Tolerance: Patient tolerated treatment well Patient left: in chair;with call bell/phone within reach;with chair alarm set Nurse Communication: Mobility status PT Visit Diagnosis: Unsteadiness on feet (R26.81);Muscle weakness (generalized) (M62.81);Difficulty in walking, not elsewhere classified (R26.2)    Time: 6606-0045 PT Time Calculation (min) (ACUTE ONLY): 42 min   Charges:   PT Evaluation $PT Eval Moderate Complexity: 1 Mod PT Treatments $Gait Training: 8-22 mins $Therapeutic  Activity: 8-22 mins   PT G Codes:        Evaluation/treatment session was lead and completed by Roxan Hockey, SPT; directly observed, guided and documented by Tera Helper, [place]. Owens Shark, PT, DPT, NCS 12/02/17, 2:54 PM [phone]

## 2017-12-02 NOTE — Progress Notes (Addendum)
SATURATION QUALIFICATIONS: (This note is used to comply with regulatory documentation for home oxygen)  Patient Saturations on Room Air at Rest = 93%  Patient Saturations on Room Air while Ambulating = 79%  Patient Saturations on 4 Liters of oxygen while Ambulating = 81%  Please briefly explain why patient needs home oxygen: Upon assessment for saturation qualification, patient is hypoxic with exertion. Desaturated to 79% on room air with ambulation. Failing to achieve a decent O2 saturation after walking for approx 50 feet, assisted back to room. While on 4L/[place], pt was instructed to take deep breaths. Sats started to slowly improved. Currently saturating 94% at rest on 3L/[place]. MD informed on bedside rounds.

## 2017-12-02 NOTE — Progress Notes (Signed)
Pt awakens easily to check b/p. Pt had 1 leg hanging out of bed while sleeping. When asked about this, pt states when she sleeps at home, she sometimes wakes up with 1 or 2 legs hanging out of the bed. Side rail on that side of the bed raised for safety. Prn hydralazine given per mar.

## 2017-12-02 NOTE — Progress Notes (Signed)
[place] [facility] [place] NAME: [name]    MR#:  [id number]  DATE OF BIRTH:  04-Sep-1932  SUBJECTIVE:  CHIEF COMPLAINT:  Sob resolved while resting, patient was requiring more than 4 L of oxygen while ambulating, while ambulating the patient on 4 L she desaturated only up to 81%,  RN had to bring the patient back as she was dyspneic.  Patient does not ambulate much at home and sees Dr. Raul Del and [place] as an outpatient   REVIEW OF SYSTEMS:  CONSTITUTIONAL: No fever, fatigue or weakness.  EYES: No blurred or double vision.  EARS, NOSE, AND THROAT: No tinnitus or ear pain.  RESPIRATORY: Patient denies cough but shortness of breath with exertion , no wheezing , no hemoptysis.  CARDIOVASCULAR: No chest pain, orthopnea, edema.  GASTROINTESTINAL: No nausea, vomiting, diarrhea or abdominal pain.  GENITOURINARY: No dysuria, hematuria.  ENDOCRINE: No polyuria, nocturia,  HEMATOLOGY: No anemia, easy bruising or bleeding SKIN: No rash or lesion. MUSCULOSKELETAL: No joint pain or arthritis.   NEUROLOGIC: No tingling, numbness, weakness.  PSYCHIATRY: No anxiety or depression.   DRUG ALLERGIES:   Allergies  Allergen Reactions  . Ciprofloxacin Nausea And Vomiting  . Hydrocodone Nausea Only  . Sertraline Nausea And Vomiting  . Codeine Nausea And Vomiting    VITALS:  Blood pressure (!) 171/80, pulse 73, temperature 98 F (36.7 C), temperature source Oral, resp. rate 18, height 5\' 4"  (1.626 m), weight 81 kg (178 lb 9.6 oz), SpO2 94 %.  PHYSICAL EXAMINATION:  GENERAL:  84 y.o.-year-old patient lying in the bed with no acute distress.  EYES: Pupils equal, round, reactive to light and accommodation. No scleral icterus. Extraocular muscles intact.  HEENT: Head atraumatic, normocephalic. Oropharynx and nasopharynx clear.  NECK:  Supple, no jugular venous distention. No thyroid enlargement, no tenderness.  LUNGS: mofd  breath sounds  bilaterally,  wheezing, no rales,rhonchi chronic baseline crepitation. No use of accessory muscles of respiration.  CARDIOVASCULAR: S1, S2 normal. No murmurs, rubs, or gallops.  ABDOMEN: Soft, nontender, nondistended. Bowel sounds present. No organomegaly or mass.  EXTREMITIES: No pedal edema, cyanosis, or clubbing.  NEUROLOGIC: Cranial nerves II through XII are intact. Muscle strength 5/5 in all extremities. Sensation intact. Gait not checked.  PSYCHIATRIC: The patient is alert and oriented x 3.  SKIN: No obvious rash, lesion, or ulcer.    LABORATORY PANEL:   CBC Recent Labs  Lab 11/30/17 1628  WBC 21.6*  HGB 12.9  HCT 40.6  PLT 342   ------------------------------------------------------------------------------------------------------------------  Chemistries  Recent Labs  Lab 11/30/17 1628  NA 135  K 4.3  CL 102  CO2 21*  GLUCOSE 149*  BUN 22*  CREATININE 1.35*  CALCIUM 9.2   ------------------------------------------------------------------------------------------------------------------  Cardiac Enzymes Recent Labs  Lab 11/30/17 1628  TROPONINI <0.03   ------------------------------------------------------------------------------------------------------------------  RADIOLOGY:  Dg Chest 2 View  Result Date: 11/30/2017 CLINICAL DATA:  Pt to ER via POV for evaluation of SOB. Pt denies CP. States that she has had increase in SOB over last few days. Family unsure if portable oxygen is working at home. Wears 2L [place] chronicallyCHF, COPD, diabetes, EXAM: CHEST - 2 VIEW COMPARISON:  Chest CT and chest radiographs, 11/02/2017. Chest radiograph, 04/18/2017. FINDINGS: Cardiac silhouette is normal in size. No mediastinal or hilar masses. No convincing adenopathy. Bilateral irregular interstitial thickening is similar to the exam dated 04/18/2017. Findings are consistent with chronic interstitial fibrosis. No convincing pneumonia or pulmonary edema. No pleural effusion  or  pneumothorax. Skeletal structures are demineralized. There are findings consistent with bilateral prior shoulder surgery as well as changes from previous anterior posterior neck fusion surgery, stable from the prior studies. IMPRESSION: 1. No acute cardiopulmonary disease. 2. Chronic lung changes consistent with interstitial lung disease. Electronically Signed   By: Lajean Manes M.D.   On: 11/30/2017 16:47   Ct Angio Chest Pe W And/or Wo Contrast  Result Date: 11/30/2017 CLINICAL DATA:  Worsening shortness of breath for a few weeks, hypoxic. History of oxygen-dependent COPD, CHF, back surgery. At EXAM: CT ANGIOGRAPHY CHEST WITH CONTRAST TECHNIQUE: Multidetector CT imaging of the chest was performed using the standard protocol during bolus administration of intravenous contrast. Multiplanar CT image reconstructions and MIPs were obtained to evaluate the vascular anatomy. CONTRAST:  81mL ISOVUE-370 IOPAMIDOL (ISOVUE-370) INJECTION 76% COMPARISON:  Chest radiograph November 30, 2017 and CT chest Nov 02, 2017 FINDINGS: Respiratory motion degraded examination. CARDIOVASCULAR: Adequate contrast opacification of the pulmonary artery's. Main pulmonary artery is not enlarged. No pulmonary arterial filling defects to the level of the subsegmental branches. Heart size is upper limits of normal. Small to moderate unchanged pericardial effusion. Thoracic aorta is normal course and caliber, severe intimal thickening calcific atherosclerosis. MEDIASTINUM/NODES: Stable mild mediastinal lymphadenopathy measuring to 14 mm LUNGS/PLEURA: Tracheobronchial tree is patent, no pneumothorax. Diffuse interstitial prominence with interlobular septal thickening. Faint ground-glass opacity decreased from prior examination. Punctate parenchymal calcifications. No honeycomb or subpleural space bearing. UPPER ABDOMEN: Nonacute. Status post cholecystectomy calcified granuloma dome of liver. It is small hiatal hernia with esophageal air-fluid  level. MUSCULOSKELETAL: Nonacute. ACDF. Thyromegaly with substernal extent. Punctate breast calcifications. Mild LEFT glenohumeral osteoarthrosis. Moderate T1 to spondylosis. Review of the MIP images confirms the above findings. findings IMPRESSION: 1. No pulmonary embolism. 2. Stable interstitial prominence compatible with chronic interstitial lung disease. Decreased ground-glass opacities/edema from prior CT. Aortic Atherosclerosis (ICD10-I70.0). Electronically Signed   By: Elon Alas M.D.   On: 11/30/2017 19:01    EKG:   Orders placed or performed during the hospital encounter of 11/30/17  . ED EKG  . ED EKG  . EKG 12-Lead  . EKG 12-Lead    ASSESSMENT AND PLAN:   Dorathea Faerber  is a 84 y.o. female with a known history of COPD who wears O2 as needed, CHF, DM, presenting with shortness of breath and hypoxia. The patient reports that for the last several weeks she has been feeling more short of breath particularly with exertion. She states that when she puts her oxygen on, she feels somewhat better but not all the way better.   1. acute on chronic COPD exacerbation with hypoxia with the past medical history of pulmonary hypertension and chronic interstitial lung disease Outpatient follow-up with Dr. Raul Del- - IV Solu-Medrol be changed to p.o. prednisone from tomorrow continue inhalers and nebulizer. -No indication for antibiotic. Chest x-ray appears consistent with chronic changes of COPD - ambulatory pox-today patient is on 4 L and only saturating at 81% -care management to assess and help with oxygen at home. -Lasix -Incentive spirometry  2. diabetes continue home meds and sliding scale insulin  3. Hypertension urgency continue home meds with history of whitecoat hypertension  increased hydralazine to 50 mg.  Continue home dose metoprolol and lisinopril Given the history of whitecoat hypertension cannot be active CLL patient is in the hospital.  Patient became extremely  hypotensive after she went home during the previous admission  4. Jerrye Bushy continue PPI  5. Leukocytosis - sec to steroids  DVT prophylaxis subcu Lovenox   Case management is involved regarding home oxygen arrangement.  We will check an ability pulse ox    All the records are reviewed and case discussed with Care Management/Social Workerr. Management plans discussed with the patient, family and they are in agreement.  CODE STATUS: fc  TOTAL TIME TAKING CARE OF THIS PATIENT: 36  minutes.   POSSIBLE D/C IN 1-2  DAYS, DEPENDING ON CLINICAL CONDITION.  Note: This dictation was prepared with Dragon dictation along with smaller phrase technology. Any transcriptional errors that result from this process are unintentional.   Nicholes Mango M.D on 12/02/2017 at 11:25 AM  Between 7am to 6pm - Pager - [phone] After 6pm go to [email or web address] - password [email or web address]  [place] [place] [facility]  Office  [phone]  CC: Primary care physician; Katheren Shams

## 2017-12-03 LAB — GLUCOSE, CAPILLARY
GLUCOSE-CAPILLARY: 94 mg/dL (ref 65–99)
GLUCOSE-CAPILLARY: 124 mg/dL — AB (ref 65–99)
Glucose-Capillary: 107 mg/dL — ABNORMAL HIGH (ref 65–99)
Glucose-Capillary: 211 mg/dL — ABNORMAL HIGH (ref 65–99)
Glucose-Capillary: 140 mg/dL — ABNORMAL HIGH (ref 65–99)

## 2017-12-03 LAB — CREATININE, SERUM
CREATININE: 1.01 mg/dL — AB (ref 0.44–1.00)
GFR calc Af Amer: 58 mL/min — ABNORMAL LOW (ref >60)
GFR, EST NON AFRICAN AMERICAN: 50 mL/min — AB (ref >60)

## 2017-12-03 MED ORDER — FUROSEMIDE 20 MG PO TABS
20.0000 mg | ORAL_TABLET | Freq: Two times a day (BID) | ORAL | Status: DC
  Administered 2017-12-03 – 2017-12-04 (×2): 20 mg via ORAL
  Filled 2017-12-03 (×2): qty 1

## 2017-12-03 NOTE — Progress Notes (Signed)
[place] [facility] [place] NAME: [name]    MR#:  [id number]  DATE OF BIRTH:  1933-01-28  SUBJECTIVE:  CHIEF COMPLAINT:  Sob resolved while resting, patient was requiring 3- 4 L of oxygen while ambulatingPatient does not ambulate much at home and sees Dr. Raul Del as an outpatient   REVIEW OF SYSTEMS:  CONSTITUTIONAL: No fever, fatigue or weakness.  EYES: No blurred or double vision.  EARS, NOSE, AND THROAT: No tinnitus or ear pain.  RESPIRATORY: Patient denies cough but shortness of breath with exertion , no wheezing , no hemoptysis.  CARDIOVASCULAR: No chest pain, orthopnea, edema.  GASTROINTESTINAL: No nausea, vomiting, diarrhea or abdominal pain.  GENITOURINARY: No dysuria, hematuria.  ENDOCRINE: No polyuria, nocturia,  HEMATOLOGY: No anemia, easy bruising or bleeding SKIN: No rash or lesion. MUSCULOSKELETAL: No joint pain or arthritis.   NEUROLOGIC: No tingling, numbness, weakness.  PSYCHIATRY: No anxiety or depression.   DRUG ALLERGIES:   Allergies  Allergen Reactions  . Ciprofloxacin Nausea And Vomiting  . Hydrocodone Nausea Only  . Sertraline Nausea And Vomiting  . Codeine Nausea And Vomiting    VITALS:  Blood pressure (!) 158/60, pulse 63, temperature 98.3 F (36.8 C), resp. rate 18, height 5\' 4"  (1.626 m), weight 81 kg (178 lb 9.6 oz), SpO2 96 %.  PHYSICAL EXAMINATION:  GENERAL:  84 y.o.-year-old patient lying in the bed with no acute distress.  EYES: Pupils equal, round, reactive to light and accommodation. No scleral icterus. Extraocular muscles intact.  HEENT: Head atraumatic, normocephalic. Oropharynx and nasopharynx clear.  NECK:  Supple, no jugular venous distention. No thyroid enlargement, no tenderness.  LUNGS: mofd  breath sounds bilaterally,  wheezing, no rales,rhonchi chronic baseline crepitation. No use of accessory muscles of respiration.  CARDIOVASCULAR: S1, S2 normal. No murmurs, rubs, or gallops.   ABDOMEN: Soft, nontender, nondistended. Bowel sounds present. No organomegaly or mass.  EXTREMITIES: No pedal edema, cyanosis, or clubbing.  NEUROLOGIC: Cranial nerves II through XII are intact. Muscle strength 5/5 in all extremities. Sensation intact. Gait not checked.  PSYCHIATRIC: The patient is alert and oriented x 3.  SKIN: No obvious rash, lesion, or ulcer.    LABORATORY PANEL:   CBC Recent Labs  Lab 11/30/17 1628  WBC 21.6*  HGB 12.9  HCT 40.6  PLT 342   ------------------------------------------------------------------------------------------------------------------  Chemistries  Recent Labs  Lab 11/30/17 1628 12/03/17 0430  NA 135  --   K 4.3  --   CL 102  --   CO2 21*  --   GLUCOSE 149*  --   BUN 22*  --   CREATININE 1.35* 1.01*  CALCIUM 9.2  --    ------------------------------------------------------------------------------------------------------------------  Cardiac Enzymes Recent Labs  Lab 11/30/17 1628  TROPONINI <0.03   ------------------------------------------------------------------------------------------------------------------  RADIOLOGY:  No results found.  EKG:   Orders placed or performed during the hospital encounter of 11/30/17  . ED EKG  . ED EKG  . EKG 12-Lead  . EKG 12-Lead    ASSESSMENT AND PLAN:   [name]  is a 84 y.o. female with a known history of COPD who wears O2 as needed, CHF, DM, presenting with shortness of breath and hypoxia. The patient reports that for the last several weeks she has been feeling more short of breath particularly with exertion. She states that when she puts her oxygen on, she feels somewhat better but not all the way better.   1. acute on chronic COPD exacerbation with  hypoxia with the past medical history of pulmonary hypertension and chronic interstitial lung disease Outpatient follow-up with Dr. Raul Del- - IV Solu-Medrol be changed to p.o. prednisone  continue inhalers and  nebulizer. -No indication for antibiotic. Chest x-ray appears consistent with chronic changes of COPD - ambulatory pox-today patient is on 4 L and only saturating at 81% -care management to assess and help with oxygen at home. -Lasix twice a day -Incentive spirometry  2. diabetes continue home meds and sliding scale insulin  3. Hypertension urgency continue home meds with history of whitecoat hypertension  increased hydralazine to 50 mg.  Continue home dose metoprolol and lisinopril Given the history of whitecoat hypertension cannot be active CLL patient is in the hospital.  Patient became extremely hypotensive after she went home during the previous admission  4. Jerrye Bushy continue PPI  5. Leukocytosis - sec to steroids   DVT prophylaxis subcu Lovenox   Case management is involved regarding home oxygen arrangement.  Home oxygen equipment and oxygen tan might not be delivered until midnight    All the records are reviewed and case discussed with Care Management/Social Workerr. Management plans discussed with the patient, family and they are in agreement.  CODE STATUS: fc  TOTAL TIME TAKING CARE OF THIS PATIENT: 32  minutes.   POSSIBLE D/C IN 1  DAYS, DEPENDING ON CLINICAL CONDITION.  Note: This dictation was prepared with Dragon dictation along with smaller phrase technology. Any transcriptional errors that result from this process are unintentional.   Nicholes Mango M.D on 12/03/2017 at 4:13 PM  Between 7am to 6pm - Pager - [phone] After 6pm go to [email or web address] - password [email or web address]  [place] [facility]  Office  [phone]  CC: Primary care physician; Katheren Shams

## 2017-12-03 NOTE — Progress Notes (Signed)
Pt ambulated about 50 feet with RN. Pt's oxygen saturation was 83% on 4L [place], pt denies being SOB, but did report being dizzy.   [place], Jerry Caras

## 2017-12-03 NOTE — Care Management (Addendum)
RNCM has attempted to call [employer] Emergency Line [phone] multiple times. Phone rings but no one answers.   TC to [phone] they stated I had to call the emergency number back at 9 am. Will try back at 9 am.

## 2017-12-03 NOTE — Care Management (Signed)
Spoke with [employer] @ [phone].. They are going to send out needed part today to patients home. They may not arrive until midnight but they will arrive by midnight per the representative. Spoke with son. He was very frustrated. He feels that [employer] has been giving him the run around . I conveyed this to [employer]. Son called [employer] and was told equipment would not be delivered until next Monday and next Wednesday.

## 2017-12-03 NOTE — Progress Notes (Signed)
Pt ambulated again this afternoon almost around nurses station, O2 sats stayed >88 on 3-4L. Pt tolerated well.   [place]ilda, Jerry Caras

## 2017-12-04 LAB — GLUCOSE, CAPILLARY: GLUCOSE-CAPILLARY: 98 mg/dL (ref 65–99)

## 2017-12-04 MED ORDER — PREDNISONE 10 MG (21) PO TBPK: ORAL_TABLET | ORAL | 0 refills | Status: DC

## 2017-12-04 MED ORDER — HYDRALAZINE HCL 50 MG PO TABS
50.0000 mg | ORAL_TABLET | Freq: Three times a day (TID) | ORAL | Status: DC
  Administered 2017-12-04: 50 mg via ORAL
  Filled 2017-12-04: qty 1

## 2017-12-04 MED ORDER — LISINOPRIL 10 MG PO TABS
10.0000 mg | ORAL_TABLET | Freq: Every day | ORAL | Status: DC
  Administered 2017-12-04: 10 mg via ORAL
  Filled 2017-12-04: qty 1

## 2017-12-04 NOTE — Care Management Note (Signed)
Case Management Note  Patient Details  Name: [name] MRN: [id number] Date of Birth: Apr 20, 1933  Subjective/Objective:    Patient to be discharged per MD order. RNCM had previously set up [place] services via [place], spoke with [facility] who had already received referral. Patient brought portable oxygen tank from home that is now working, home unit set up and is working properly as well. Apparently [employer] had delivered all the necessary supplies yesterday so now patient can safely be discharged and receive home oxygen. RNCm signed off                 Action/Plan:   Expected Discharge Date:  12/04/17               Expected Discharge Plan:  [place]  In-House Referral:     Discharge planning Services  CM Consult  Post Acute Care Choice:  Home Health Choice offered to:  Adult Children  DME Arranged:    DME Agency:     HH Arranged:  RN, PT [place] Agency:  [place]  Status of Service:  Completed, signed off  If discussed at [place] of Stay Meetings, dates discussed:    Additional Comments:  Latanya Maudlin, RN 12/04/2017, 12:11 PM

## 2017-12-04 NOTE — Discharge Instructions (Signed)
Oxygen 2 L/min  Activity as tolerated

## 2017-12-04 NOTE — Progress Notes (Signed)
[name] to be D/C'd Home per MD order.  Discussed prescriptions and follow up appointments with the patient. Prescriptions electronically submitted, medication list explained in detail. Pt verbalized understanding.  Allergies as of 12/04/2017      Reactions   Ciprofloxacin Nausea And Vomiting   Hydrocodone Nausea Only   Sertraline Nausea And Vomiting   Codeine Nausea And Vomiting      Medication List    TAKE these medications   albuterol 108 (90 Base) MCG/ACT inhaler Commonly known as:  PROVENTIL HFA;VENTOLIN HFA Inhale 2 puffs into the lungs every 6 (six) hours as needed for wheezing or shortness of breath.   aspirin EC 81 MG tablet Take 81 mg by mouth daily. breakfast   butalbital-acetaminophen-caffeine 50-325-40 MG tablet Commonly known as:  FIORICET, ESGIC Take 1 tablet by mouth every 6 (six) hours as needed for headache or migraine.   clobetasol cream 0.05 % Commonly known as:  TEMOVATE Apply 1 application as needed topically.   cyanocobalamin 1000 MCG tablet Take 1,000 mcg by mouth daily. breakfast   DULoxetine 20 MG capsule Commonly known as:  CYMBALTA Take 20 mg daily by mouth.   furosemide 20 MG tablet Commonly known as:  LASIX Take 20 mg by mouth daily. May take second dose if needed for swelling.   gabapentin 400 MG capsule Commonly known as:  NEURONTIN Take 1 capsule (400 mg total) by mouth at bedtime. What changed:  how much to take   hydrALAZINE 25 MG tablet Commonly known as:  APRESOLINE Take 1 tablet (25 mg total) 3 (three) times daily by mouth.   lisinopril 5 MG tablet Commonly known as:  PRINIVIL,ZESTRIL Take 1 tablet (5 mg total) by mouth daily.   metoprolol succinate 25 MG 24 hr tablet Commonly known as:  TOPROL-XL Take 25 mg daily by mouth.   pantoprazole 40 MG tablet Commonly known as:  PROTONIX Take 40 mg 2 (two) times daily by mouth.   predniSONE 10 MG (21) Tbpk tablet Commonly known as:  STERAPRED UNI-PAK 21 TAB 6 tabs day  1 and taper 10 mg a day - 6 days   simvastatin 20 MG tablet Commonly known as:  ZOCOR Take 20 mg by mouth daily.   solifenacin 10 MG tablet Commonly known as:  VESICARE Take 10 mg by mouth daily. breakfast   traMADol 50 MG tablet Commonly known as:  ULTRAM Take every 6 (six) hours as needed by mouth.       Vitals:   12/04/17 0855 12/04/17 1039  BP: (!) 175/72 (!) 145/73  Pulse: 65 76  Resp:    Temp:    SpO2:      Skin clean, dry and intact without evidence of skin break down, no evidence of skin tears noted. IV catheter discontinued intact. Site without signs and symptoms of complications. Dressing and pressure applied. Pt denies pain at this time. No complaints noted.  An After Visit Summary was printed and given to the patient. Patient escorted via [facility] with personal o2 tank, and D/C home via private auto.  [place]

## 2017-12-04 NOTE — Progress Notes (Signed)
Pt alert and oriented. Remaining on 2L of oxygen. Ambulating to bathroom with assistance. No complaints of shortness of breath. Pt able to sleep in between care.

## 2017-12-07 ENCOUNTER — Telehealth: Payer: Self-pay

## 2017-12-07 NOTE — Telephone Encounter (Signed)
EMMI Follow-up: Noted on the report that patient said no follow-up appointment made.  I talked with [name] and she said her daughter handles making appointments for her and will remind her this afternoon to make one at [facility]. Waiting for company to deliver her portable 02, suppose to be in by Thursday.  I let her know there would be one more follow-up automated call and to let us know if she has any concerns at that time.

## 2017-12-09 NOTE — Discharge Summary (Signed)
[facility] [place] NAME: [name]    MR#:  [id number]  DATE OF BIRTH:  [birth year]  DATE OF ADMISSION:  11/30/2017 ADMITTING PHYSICIAN: Fritzi Mandes, MD  DATE OF DISCHARGE: 12/04/2017  1:04 PM  PRIMARY CARE PHYSICIAN: [facility]   ADMISSION DIAGNOSIS:  SOB (shortness of breath) [R06.02] Hypoxia [R09.02] Decreased exercise tolerance [R68.89]  DISCHARGE DIAGNOSIS:  Active Problems:   Acute on chronic respiratory failure with hypoxia (HCC)   SECONDARY DIAGNOSIS:   Past Medical History:  Diagnosis Date  . Anxiety   . Arthritis    hands  . Cancer ([place])   . CHF (congestive heart failure) (HCC)    swelling of feet and legs  . COPD (chronic obstructive pulmonary disease) (HCC)    O2 use at night 2 liters  . DDD (degenerative disc disease), cervical    cervical laminectomy x 2  . Deaf, right   . Depression   . Diabetes mellitus without complication ([place])   . GERD (gastroesophageal reflux disease)   . High cholesterol   . History of hiatal hernia   . Hypertension    controlled on meds  . Neuromuscular disorder (HCC)    neuropathy  . PONV (postoperative nausea and vomiting)   . Shortness of breath dyspnea   . Vertigo    last episode approx 1 yr ago  . Wears dentures    full upper and lower     ADMITTING HISTORY  HISTORY OF PRESENT ILLNESS:  [name]  is a 84 y.o. female with a known history of COPD who wears O2 as needed, CHF, DM, presenting with shortness of breath and hypoxia. The patient reports that for the last several weeks she has been feeling more short of breath particularly with exertion. She states that when she puts her oxygen on, she feels somewhat better but not all the way better. She went to get a corticosteroid injection of her right shoulder yesterday with Dr. Harlow Mares, and was found to have O2 sats in the 10s; a follow-up appointment was made to see her PMD today,also found the patient to  be hypoxic in the 63s and 80s. The patient denies any other new symptoms. She has not had any fevers or chills, change in her cough character or severity.  It is being admitted for further evaluation of management. She received some IV Solu-Medrol in the ER. CT chest done in the ER negative for PE  HOSPITAL COURSE:   NancyParrishis a85 y.o.femalewith a known history of COPD who wears O2 as needed, CHF, DM, presenting with shortness of breath and hypoxia. The patient reports that for the last several weeks she has been feeling more short of breath particularly with exertion. She states that when she puts her oxygen on, she feels somewhat better but not all the way better.  1.Acute COPD exacerbation with hypoxia with the past medical history of pulmonary hypertension and chronic interstitial lung disease Treated with IV steroids, nebulizers.  Patient slowly improved.  By the day of discharge she is back to her baseline.  Patient has home health set up an oxygen at home available.  Will be discharged home in stable condition to follow-up with her pulmonologist as outpatient.  2.diabetes continue home meds and sliding scale insulin  3.Hypertension urgency continue home meds with history of whitecoat hypertension  increased hydralazine to 50 mg.  Continue home dose metoprolol and lisinopril Blood pressure in 546E range systolic by the day of  discharge.  Improved.  4.Gerd continue PPI  5. Leukocytosis - sec to steroids  Patient stable for discharge home  CONSULTS OBTAINED:    DRUG ALLERGIES:   Allergies  Allergen Reactions  . Ciprofloxacin Nausea And Vomiting  . Hydrocodone Nausea Only  . Sertraline Nausea And Vomiting  . Codeine Nausea And Vomiting    DISCHARGE MEDICATIONS:   Allergies as of 12/04/2017      Reactions   Ciprofloxacin Nausea And Vomiting   Hydrocodone Nausea Only   Sertraline Nausea And Vomiting   Codeine Nausea And Vomiting      Medication  List    TAKE these medications   albuterol 108 (90 Base) MCG/ACT inhaler Commonly known as:  PROVENTIL HFA;VENTOLIN HFA Inhale 2 puffs into the lungs every 6 (six) hours as needed for wheezing or shortness of breath.   aspirin EC 81 MG tablet Take 81 mg by mouth daily. breakfast   butalbital-acetaminophen-caffeine 50-325-40 MG tablet Commonly known as:  FIORICET, ESGIC Take 1 tablet by mouth every 6 (six) hours as needed for headache or migraine.   clobetasol cream 0.05 % Commonly known as:  TEMOVATE Apply 1 application as needed topically.   cyanocobalamin 1000 MCG tablet Take 1,000 mcg by mouth daily. breakfast   DULoxetine 20 MG capsule Commonly known as:  CYMBALTA Take 20 mg daily by mouth.   furosemide 20 MG tablet Commonly known as:  LASIX Take 20 mg by mouth daily. May take second dose if needed for swelling.   gabapentin 400 MG capsule Commonly known as:  NEURONTIN Take 1 capsule (400 mg total) by mouth at bedtime. What changed:  how much to take   hydrALAZINE 25 MG tablet Commonly known as:  APRESOLINE Take 1 tablet (25 mg total) 3 (three) times daily by mouth.   lisinopril 5 MG tablet Commonly known as:  PRINIVIL,ZESTRIL Take 1 tablet (5 mg total) by mouth daily.   metoprolol succinate 25 MG 24 hr tablet Commonly known as:  TOPROL-XL Take 25 mg daily by mouth.   pantoprazole 40 MG tablet Commonly known as:  PROTONIX Take 40 mg 2 (two) times daily by mouth.   predniSONE 10 MG (21) Tbpk tablet Commonly known as:  STERAPRED UNI-PAK 21 TAB 6 tabs day 1 and taper 10 mg a day - 6 days   simvastatin 20 MG tablet Commonly known as:  ZOCOR Take 20 mg by mouth daily.   solifenacin 10 MG tablet Commonly known as:  VESICARE Take 10 mg by mouth daily. breakfast   traMADol 50 MG tablet Commonly known as:  ULTRAM Take every 6 (six) hours as needed by mouth.       Today   VITAL SIGNS:  Blood pressure (!) 145/73, pulse 76, temperature 97.7 F (36.5  C), temperature source Oral, resp. rate 18, height 5\' 4"  (1.626 m), weight 81 kg (178 lb 9.6 oz), SpO2 94 %.  I/O:  No intake or output data in the 24 hours ending 12/09/17 1436  PHYSICAL EXAMINATION:  Physical Exam  GENERAL:  84 y.o.-year-old patient lying in the bed with no acute distress.  LUNGS: Normal breath sounds bilaterally, no wheezing, rales,rhonchi or crepitation. No use of accessory muscles of respiration.  CARDIOVASCULAR: S1, S2 normal. No murmurs, rubs, or gallops.  ABDOMEN: Soft, non-tender, non-distended. Bowel sounds present. No organomegaly or mass.  NEUROLOGIC: Moves all 4 extremities. PSYCHIATRIC: The patient is alert and oriented x 3.  SKIN: No obvious rash, lesion, or ulcer.   DATA REVIEW:  CBC No results for input(s): WBC, HGB, HCT, PLT in the last 168 hours.  Chemistries  Recent Labs  Lab 12/03/17 0430  CREATININE 1.01*    Cardiac Enzymes No results for input(s): TROPONINI in the last 168 hours.  Microbiology Results  Results for orders placed or performed during the hospital encounter of 07/07/15  Blood culture (routine x 2)     Status: None   Collection Time: 07/07/15 11:36 AM  Result Value Ref Range Status   Specimen Description BLOOD LEFT ASSIST CONTROL  Final   Special Requests BOTTLES DRAWN AEROBIC AND ANAEROBIC [place]  Final   Culture NO GROWTH 5 DAYS  Final   Report Status 07/12/2015 FINAL  Final  Blood culture (routine x 2)     Status: None   Collection Time: 07/07/15 11:36 AM  Result Value Ref Range Status   Specimen Description BLOOD LEFT ASSIST CONTROL  Final   Special Requests BOTTLES DRAWN AEROBIC AND ANAEROBIC [place]  Final   Culture NO GROWTH 5 DAYS  Final   Report Status 07/12/2015 FINAL  Final    RADIOLOGY:  No results found.  Follow up with PCP in 1 week.  Management plans discussed with the patient, family and they are in agreement.  CODE STATUS:  Code Status History    Date Active Date Inactive  Code Status Order ID Comments User Context   11/30/2017 2022 12/04/2017 1610 Full Code [id number]  Fritzi Mandes, MD Inpatient   04/18/2017 2345 04/19/2017 2040 Full Code [id number]  Dustin Flock, MD Inpatient   02/13/2017 1625 02/17/2017 1517 Full Code [id number]  Nicholes Mango, MD Inpatient      TOTAL TIME TAKING CARE OF THIS PATIENT ON DAY OF DISCHARGE: more than 30 minutes.   Leia Alf Shada Nienaber M.D on 12/09/2017 at 2:36 PM  Between 7am to 6pm - Pager - [phone]  After 6pm go to [email or web address] - password [email or web address] [facility]  Office  [phone]  CC: Primary care physician; Katheren Shams  Note: This dictation was prepared with Dragon dictation along with smaller phrase technology. Any transcriptional errors that result from this process are unintentional.

## 2018-01-19 ENCOUNTER — Emergency Department: Payer: Medicare Other

## 2018-01-19 ENCOUNTER — Inpatient Hospital Stay: Payer: Medicare Other

## 2018-01-19 ENCOUNTER — Inpatient Hospital Stay
Admission: EM | Admit: 2018-01-19 | Discharge: 2018-02-12 | DRG: 871 | Disposition: E | Payer: Medicare Other | Attending: Internal Medicine | Admitting: Internal Medicine

## 2018-01-19 ENCOUNTER — Other Ambulatory Visit: Payer: Self-pay

## 2018-01-19 DIAGNOSIS — I509 Heart failure, unspecified: Secondary | ICD-10-CM

## 2018-01-19 DIAGNOSIS — F329 Major depressive disorder, single episode, unspecified: Secondary | ICD-10-CM | POA: Diagnosis present

## 2018-01-19 DIAGNOSIS — Z7189 Other specified counseling: Secondary | ICD-10-CM | POA: Diagnosis not present

## 2018-01-19 DIAGNOSIS — J9601 Acute respiratory failure with hypoxia: Secondary | ICD-10-CM | POA: Diagnosis not present

## 2018-01-19 DIAGNOSIS — I110 Hypertensive heart disease with heart failure: Secondary | ICD-10-CM | POA: Diagnosis present

## 2018-01-19 DIAGNOSIS — M19042 Primary osteoarthritis, left hand: Secondary | ICD-10-CM | POA: Diagnosis present

## 2018-01-19 DIAGNOSIS — N179 Acute kidney failure, unspecified: Secondary | ICD-10-CM | POA: Diagnosis not present

## 2018-01-19 DIAGNOSIS — I421 Obstructive hypertrophic cardiomyopathy: Secondary | ICD-10-CM | POA: Diagnosis present

## 2018-01-19 DIAGNOSIS — E785 Hyperlipidemia, unspecified: Secondary | ICD-10-CM | POA: Diagnosis present

## 2018-01-19 DIAGNOSIS — G92 Toxic encephalopathy: Secondary | ICD-10-CM | POA: Diagnosis not present

## 2018-01-19 DIAGNOSIS — J441 Chronic obstructive pulmonary disease with (acute) exacerbation: Secondary | ICD-10-CM | POA: Diagnosis present

## 2018-01-19 DIAGNOSIS — E870 Hyperosmolality and hypernatremia: Secondary | ICD-10-CM | POA: Diagnosis not present

## 2018-01-19 DIAGNOSIS — J8410 Pulmonary fibrosis, unspecified: Secondary | ICD-10-CM

## 2018-01-19 DIAGNOSIS — K219 Gastro-esophageal reflux disease without esophagitis: Secondary | ICD-10-CM | POA: Diagnosis present

## 2018-01-19 DIAGNOSIS — J9621 Acute and chronic respiratory failure with hypoxia: Secondary | ICD-10-CM | POA: Diagnosis not present

## 2018-01-19 DIAGNOSIS — E7800 Pure hypercholesterolemia, unspecified: Secondary | ICD-10-CM | POA: Diagnosis present

## 2018-01-19 DIAGNOSIS — Y95 Nosocomial condition: Secondary | ICD-10-CM | POA: Diagnosis present

## 2018-01-19 DIAGNOSIS — M5030 Other cervical disc degeneration, unspecified cervical region: Secondary | ICD-10-CM | POA: Diagnosis present

## 2018-01-19 DIAGNOSIS — I351 Nonrheumatic aortic (valve) insufficiency: Secondary | ICD-10-CM | POA: Diagnosis not present

## 2018-01-19 DIAGNOSIS — Z87891 Personal history of nicotine dependence: Secondary | ICD-10-CM

## 2018-01-19 DIAGNOSIS — Z9842 Cataract extraction status, left eye: Secondary | ICD-10-CM

## 2018-01-19 DIAGNOSIS — J9600 Acute respiratory failure, unspecified whether with hypoxia or hypercapnia: Secondary | ICD-10-CM

## 2018-01-19 DIAGNOSIS — J84112 Idiopathic pulmonary fibrosis: Secondary | ICD-10-CM | POA: Diagnosis not present

## 2018-01-19 DIAGNOSIS — A419 Sepsis, unspecified organism: Secondary | ICD-10-CM

## 2018-01-19 DIAGNOSIS — R0602 Shortness of breath: Secondary | ICD-10-CM | POA: Diagnosis not present

## 2018-01-19 DIAGNOSIS — F419 Anxiety disorder, unspecified: Secondary | ICD-10-CM | POA: Diagnosis present

## 2018-01-19 DIAGNOSIS — Z90710 Acquired absence of both cervix and uterus: Secondary | ICD-10-CM

## 2018-01-19 DIAGNOSIS — Z66 Do not resuscitate: Secondary | ICD-10-CM | POA: Diagnosis present

## 2018-01-19 DIAGNOSIS — Z9841 Cataract extraction status, right eye: Secondary | ICD-10-CM

## 2018-01-19 DIAGNOSIS — M19041 Primary osteoarthritis, right hand: Secondary | ICD-10-CM | POA: Diagnosis present

## 2018-01-19 DIAGNOSIS — Z961 Presence of intraocular lens: Secondary | ICD-10-CM | POA: Diagnosis present

## 2018-01-19 DIAGNOSIS — J440 Chronic obstructive pulmonary disease with acute lower respiratory infection: Secondary | ICD-10-CM | POA: Diagnosis present

## 2018-01-19 DIAGNOSIS — J189 Pneumonia, unspecified organism: Secondary | ICD-10-CM

## 2018-01-19 DIAGNOSIS — D649 Anemia, unspecified: Secondary | ICD-10-CM | POA: Diagnosis present

## 2018-01-19 DIAGNOSIS — Z515 Encounter for palliative care: Secondary | ICD-10-CM | POA: Diagnosis not present

## 2018-01-19 DIAGNOSIS — R0902 Hypoxemia: Secondary | ICD-10-CM | POA: Diagnosis not present

## 2018-01-19 DIAGNOSIS — Z9049 Acquired absence of other specified parts of digestive tract: Secondary | ICD-10-CM

## 2018-01-19 DIAGNOSIS — I5033 Acute on chronic diastolic (congestive) heart failure: Secondary | ICD-10-CM | POA: Diagnosis present

## 2018-01-19 DIAGNOSIS — E873 Alkalosis: Secondary | ICD-10-CM | POA: Diagnosis not present

## 2018-01-19 DIAGNOSIS — T380X5A Adverse effect of glucocorticoids and synthetic analogues, initial encounter: Secondary | ICD-10-CM | POA: Diagnosis not present

## 2018-01-19 DIAGNOSIS — E876 Hypokalemia: Secondary | ICD-10-CM | POA: Diagnosis not present

## 2018-01-19 DIAGNOSIS — Z885 Allergy status to narcotic agent status: Secondary | ICD-10-CM | POA: Diagnosis not present

## 2018-01-19 DIAGNOSIS — Z881 Allergy status to other antibiotic agents status: Secondary | ICD-10-CM

## 2018-01-19 DIAGNOSIS — E1140 Type 2 diabetes mellitus with diabetic neuropathy, unspecified: Secondary | ICD-10-CM | POA: Diagnosis present

## 2018-01-19 DIAGNOSIS — H9191 Unspecified hearing loss, right ear: Secondary | ICD-10-CM | POA: Diagnosis present

## 2018-01-19 DIAGNOSIS — Z7982 Long term (current) use of aspirin: Secondary | ICD-10-CM

## 2018-01-19 DIAGNOSIS — Z888 Allergy status to other drugs, medicaments and biological substances status: Secondary | ICD-10-CM

## 2018-01-19 LAB — BASIC METABOLIC PANEL
ANION GAP: 9 (ref 5–15)
BUN: 17 mg/dL (ref 8–23)
CO2: 29 mmol/L (ref 22–32)
Calcium: 9.1 mg/dL (ref 8.9–10.3)
Chloride: 102 mmol/L (ref 98–111)
Creatinine, Ser: 1.03 mg/dL — ABNORMAL HIGH (ref 0.44–1.00)
GFR calc Af Amer: 57 mL/min — ABNORMAL LOW (ref >60)
GFR calc non Af Amer: 49 mL/min — ABNORMAL LOW (ref >60)
GLUCOSE: 139 mg/dL — AB (ref 70–99)
POTASSIUM: 3.6 mmol/L (ref 3.5–5.1)
Sodium: 140 mmol/L (ref 135–145)

## 2018-01-19 LAB — CBC
HEMATOCRIT: 36.3 % (ref 35.0–47.0)
Hemoglobin: 11.7 g/dL — ABNORMAL LOW (ref 12.0–16.0)
MCH: 25.7 pg — AB (ref 26.0–34.0)
MCHC: 32.2 g/dL (ref 32.0–36.0)
MCV: 79.7 fL — ABNORMAL LOW (ref 80.0–100.0)
Platelets: 282 10*3/uL (ref 150–440)
RBC: 4.55 MIL/uL (ref 3.80–5.20)
RDW: 17.8 % — ABNORMAL HIGH (ref 11.5–14.5)
WBC: 13.4 10*3/uL — ABNORMAL HIGH (ref 3.6–11.0)

## 2018-01-19 LAB — GLUCOSE, CAPILLARY
Glucose-Capillary: 162 mg/dL — ABNORMAL HIGH (ref 70–99)
Glucose-Capillary: 173 mg/dL — ABNORMAL HIGH (ref 70–99)

## 2018-01-19 LAB — TROPONIN I: Troponin I: <0.03 ng/mL (ref <0.03)

## 2018-01-19 LAB — LACTIC ACID, PLASMA: LACTIC ACID, VENOUS: 1.7 mmol/L (ref 0.5–1.9)

## 2018-01-19 LAB — BRAIN NATRIURETIC PEPTIDE: B Natriuretic Peptide: 88 pg/mL (ref 0.0–100.0)

## 2018-01-19 MED ORDER — TRAMADOL HCL 50 MG PO TABS: 50.0000 mg | ORAL_TABLET | Freq: Four times a day (QID) | ORAL | Status: DC | PRN

## 2018-01-19 MED ORDER — IPRATROPIUM-ALBUTEROL 0.5-2.5 (3) MG/3ML IN SOLN
6.0000 mL | Freq: Once | RESPIRATORY_TRACT | Status: AC
  Administered 2018-01-19: 6 mL via RESPIRATORY_TRACT
  Filled 2018-01-19: qty 6

## 2018-01-19 MED ORDER — HYDRALAZINE HCL 50 MG PO TABS
25.0000 mg | ORAL_TABLET | Freq: Three times a day (TID) | ORAL | Status: DC
  Administered 2018-01-19 – 2018-01-20 (×4): 25 mg via ORAL
  Filled 2018-01-19 (×4): qty 1

## 2018-01-19 MED ORDER — VITAMIN B-12 1000 MCG PO TABS
1000.0000 ug | ORAL_TABLET | Freq: Every day | ORAL | Status: DC
  Administered 2018-01-20 – 2018-01-27 (×5): 1000 ug via ORAL
  Filled 2018-01-19 (×6): qty 1

## 2018-01-19 MED ORDER — CEFEPIME HCL 1 G IJ SOLR
1.0000 g | Freq: Once | INTRAMUSCULAR | Status: AC
  Administered 2018-01-19: 1 g via INTRAVENOUS
  Filled 2018-01-19: qty 1

## 2018-01-19 MED ORDER — METHYLPREDNISOLONE SODIUM SUCC 40 MG IJ SOLR
40.0000 mg | Freq: Four times a day (QID) | INTRAMUSCULAR | Status: DC
  Administered 2018-01-19 – 2018-01-22 (×12): 40 mg via INTRAVENOUS
  Filled 2018-01-19 (×12): qty 1

## 2018-01-19 MED ORDER — ENOXAPARIN SODIUM 40 MG/0.4ML ~~LOC~~ SOLN
40.0000 mg | SUBCUTANEOUS | Status: DC
  Administered 2018-01-19 – 2018-01-26 (×8): 40 mg via SUBCUTANEOUS
  Filled 2018-01-19 (×8): qty 0.4

## 2018-01-19 MED ORDER — PANTOPRAZOLE SODIUM 40 MG PO TBEC
40.0000 mg | DELAYED_RELEASE_TABLET | Freq: Two times a day (BID) | ORAL | Status: DC
  Administered 2018-01-19 – 2018-01-20 (×3): 40 mg via ORAL
  Filled 2018-01-19 (×3): qty 1

## 2018-01-19 MED ORDER — CLOBETASOL PROPIONATE 0.05 % EX CREA
1.0000 "application " | TOPICAL_CREAM | CUTANEOUS | Status: DC | PRN
  Filled 2018-01-19: qty 15

## 2018-01-19 MED ORDER — INSULIN ASPART 100 UNIT/ML ~~LOC~~ SOLN
0.0000 [IU] | Freq: Three times a day (TID) | SUBCUTANEOUS | Status: DC
  Administered 2018-01-19: 4 [IU] via SUBCUTANEOUS
  Administered 2018-01-20: 7 [IU] via SUBCUTANEOUS
  Administered 2018-01-20 – 2018-01-21 (×3): 3 [IU] via SUBCUTANEOUS
  Administered 2018-01-21: 4 [IU] via SUBCUTANEOUS
  Administered 2018-01-22: 3 [IU] via SUBCUTANEOUS
  Filled 2018-01-19 (×6): qty 1

## 2018-01-19 MED ORDER — METHYLPREDNISOLONE SODIUM SUCC 125 MG IJ SOLR
125.0000 mg | Freq: Once | INTRAMUSCULAR | Status: AC
  Administered 2018-01-19: 125 mg via INTRAVENOUS
  Filled 2018-01-19: qty 2

## 2018-01-19 MED ORDER — METOPROLOL SUCCINATE ER 50 MG PO TB24
25.0000 mg | ORAL_TABLET | Freq: Every day | ORAL | Status: DC
  Administered 2018-01-20: 25 mg via ORAL
  Filled 2018-01-19 (×2): qty 1

## 2018-01-19 MED ORDER — IPRATROPIUM-ALBUTEROL 0.5-2.5 (3) MG/3ML IN SOLN: 3.0000 mL | RESPIRATORY_TRACT | Status: DC | PRN

## 2018-01-19 MED ORDER — GABAPENTIN 300 MG PO CAPS
400.0000 mg | ORAL_CAPSULE | Freq: Every day | ORAL | Status: DC
  Administered 2018-01-19 – 2018-01-26 (×6): 400 mg via ORAL
  Filled 2018-01-19 (×6): qty 1

## 2018-01-19 MED ORDER — INSULIN ASPART 100 UNIT/ML ~~LOC~~ SOLN: 0.0000 [IU] | Freq: Every day | SUBCUTANEOUS | Status: DC

## 2018-01-19 MED ORDER — DULOXETINE HCL 20 MG PO CPEP
20.0000 mg | ORAL_CAPSULE | Freq: Every day | ORAL | Status: DC
  Administered 2018-01-20 – 2018-01-27 (×5): 20 mg via ORAL
  Filled 2018-01-19 (×11): qty 1

## 2018-01-19 MED ORDER — ASPIRIN EC 81 MG PO TBEC
81.0000 mg | DELAYED_RELEASE_TABLET | Freq: Every day | ORAL | Status: DC
  Administered 2018-01-20 – 2018-01-27 (×5): 81 mg via ORAL
  Filled 2018-01-19 (×7): qty 1

## 2018-01-19 MED ORDER — ALBUTEROL SULFATE (2.5 MG/3ML) 0.083% IN NEBU: 3.0000 mL | INHALATION_SOLUTION | Freq: Four times a day (QID) | RESPIRATORY_TRACT | Status: DC | PRN

## 2018-01-19 MED ORDER — SIMVASTATIN 20 MG PO TABS
20.0000 mg | ORAL_TABLET | Freq: Every day | ORAL | Status: DC
  Administered 2018-01-20 – 2018-01-27 (×5): 20 mg via ORAL
  Filled 2018-01-19 (×6): qty 1

## 2018-01-19 MED ORDER — FUROSEMIDE 20 MG PO TABS
20.0000 mg | ORAL_TABLET | Freq: Every day | ORAL | Status: DC
  Administered 2018-01-20: 20 mg via ORAL
  Filled 2018-01-19 (×2): qty 1

## 2018-01-19 MED ORDER — DARIFENACIN HYDROBROMIDE ER 7.5 MG PO TB24
7.5000 mg | ORAL_TABLET | Freq: Every day | ORAL | Status: DC
  Administered 2018-01-20 – 2018-01-27 (×5): 7.5 mg via ORAL
  Filled 2018-01-19 (×10): qty 1

## 2018-01-19 MED ORDER — VANCOMYCIN HCL IN DEXTROSE 1-5 GM/200ML-% IV SOLN
1000.0000 mg | Freq: Once | INTRAVENOUS | Status: AC
  Administered 2018-01-19: 1000 mg via INTRAVENOUS
  Filled 2018-01-19: qty 200

## 2018-01-19 MED ORDER — VANCOMYCIN HCL 10 G IV SOLR
1250.0000 mg | INTRAVENOUS | Status: DC
  Administered 2018-01-19: 1250 mg via INTRAVENOUS
  Filled 2018-01-19 (×2): qty 1250

## 2018-01-19 MED ORDER — LISINOPRIL 5 MG PO TABS
5.0000 mg | ORAL_TABLET | Freq: Every day | ORAL | Status: DC
  Administered 2018-01-20 – 2018-01-27 (×5): 5 mg via ORAL
  Filled 2018-01-19 (×6): qty 1

## 2018-01-19 MED ORDER — SODIUM CHLORIDE 0.9 % IV SOLN
1.0000 g | Freq: Three times a day (TID) | INTRAVENOUS | Status: DC
  Administered 2018-01-19 – 2018-01-20 (×2): 1 g via INTRAVENOUS
  Filled 2018-01-19 (×4): qty 1

## 2018-01-19 MED ORDER — BUDESONIDE 0.5 MG/2ML IN SUSP
0.5000 mg | Freq: Two times a day (BID) | RESPIRATORY_TRACT | Status: DC
  Administered 2018-01-19 – 2018-01-24 (×10): 0.5 mg via RESPIRATORY_TRACT
  Filled 2018-01-19 (×13): qty 2

## 2018-01-19 NOTE — ED Triage Notes (Signed)
Pt comes into the ED via EMS from home with c/o SOB this morning. States she is normally on 3L [place] at home and today when the home health nurse cheeked on her her sats where in the 58's. Pt arrival with duoneb in process with a total of 2.. Pt is a/ox4 on arrival states she was fine yesterday and upon waking this morning feeling SOB.

## 2018-01-19 NOTE — H&P (Signed)
[facility] [place] NAME: [name]    MR#:  [id number]  DATE OF BIRTH:  1933/01/28  DATE OF ADMISSION:  02/09/2018  PRIMARY CARE PHYSICIAN: Kirk Ruths, MD   REQUESTING/REFERRING PHYSICIAN:   CHIEF COMPLAINT:   Chief Complaint  Patient presents with  . Shortness of Breath    HISTORY OF PRESENT ILLNESS: [name]  is a 84 y.o. female with a known history per below, recent hospital admission for pneumonia, presented to the emergency room with a one-week history of worsening shortness of breath with associated cough, no better with breathing treatments, patient was noted to be satting 60% on 4 L via nasal cannula as checked by her home health nurse, patient was brought to the emergency room via EMS, did receive DuoNeb's, patient placed on nonrebreather in the emergency room with O2 saturation in the 80s, noted tachypnea, work-up noted for pneumonia on chest x-ray, evaluated emergency room, daughter at the bedside, patient now be admitted for acute sepsis due to acute H CAP.   PAST MEDICAL HISTORY:   Past Medical History:  Diagnosis Date  . Anxiety   . Arthritis    hands  . Cancer ([place])   . CHF (congestive heart failure) (HCC)    swelling of feet and legs  . COPD (chronic obstructive pulmonary disease) (HCC)    O2 use at night 2 liters  . DDD (degenerative disc disease), cervical    cervical laminectomy x 2  . Deaf, right   . Depression   . Diabetes mellitus without complication ([place])   . GERD (gastroesophageal reflux disease)   . High cholesterol   . History of hiatal hernia   . Hypertension    controlled on meds  . Neuromuscular disorder (HCC)    neuropathy  . PONV (postoperative nausea and vomiting)   . Shortness of breath dyspnea   . Vertigo    last episode approx 1 yr ago  . Wears dentures    full upper and lower    PAST SURGICAL HISTORY:  Past Surgical History:  Procedure Laterality Date  . ABDOMINAL  HYSTERECTOMY  1963  . APPENDECTOMY    . BACK SURGERY     cervical laminectomy x2  . CATARACT EXTRACTION W/PHACO Right 03/10/2016   Procedure: CATARACT EXTRACTION PHACO AND INTRAOCULAR LENS PLACEMENT (IOC);  Surgeon: Leandrew Koyanagi, MD;  Location: [facility];  Service: Ophthalmology;  Laterality: Right;  DIABETIC - oral meds  . CATARACT EXTRACTION W/PHACO Left 04/07/2016   Procedure: CATARACT EXTRACTION PHACO AND INTRAOCULAR LENS PLACEMENT (IOC);  Surgeon: Leandrew Koyanagi, MD;  Location: [facility];  Service: Ophthalmology;  Laterality: Left;  DIABETIC, oral med [place]    . ESOPHAGOGASTRODUODENOSCOPY N/A 07/18/2015   Procedure: ESOPHAGOGASTRODUODENOSCOPY (EGD);  Surgeon: Hulen Luster, MD;  Location: [facility] ENDOSCOPY;  Service: Gastroenterology;  Laterality: N/A;  . JOINT REPLACEMENT     hip / also revision  . SHOULDER SURGERY      SOCIAL HISTORY:  Social History   Tobacco Use  . Smoking status: Former Smoker    Packs/day: 0.25    Years: 20.00    Pack years: 5.00    Types: Cigarettes    Last attempt to quit: 03/14/2000    Years since quitting: 17.8  . Smokeless tobacco: Never Used  Substance Use Topics  . Alcohol use: No    FAMILY HISTORY: No family history on file.  DRUG ALLERGIES:  Allergies  Allergen  Reactions  . Ciprofloxacin Nausea And Vomiting  . Hydrocodone Nausea Only  . Sertraline Nausea And Vomiting  . Codeine Nausea And Vomiting    REVIEW OF SYSTEMS:   CONSTITUTIONAL: + fever, fatigue, weakness.  EYES: No blurred or double vision.  EARS, NOSE, AND THROAT: No tinnitus or ear pain.  RESPIRATORY: +cough, shortness of breath, wheezing  CARDIOVASCULAR: No chest pain, orthopnea, edema.  GASTROINTESTINAL: No nausea, vomiting, diarrhea or abdominal pain.  GENITOURINARY: No dysuria, hematuria.  ENDOCRINE: No polyuria, nocturia,  HEMATOLOGY: No anemia, easy bruising or bleeding SKIN: No rash or lesion. MUSCULOSKELETAL: No  joint pain or arthritis.   NEUROLOGIC: No tingling, numbness, weakness.  PSYCHIATRY: No anxiety or depression.   MEDICATIONS AT HOME:  Prior to Admission medications   Medication Sig Start Date End Date Taking? Authorizing Provider  albuterol (PROVENTIL HFA;VENTOLIN HFA) 108 (90 Base) MCG/ACT inhaler Inhale 2 puffs into the lungs every 6 (six) hours as needed for wheezing or shortness of breath. 11/02/17  Yes Alfred Levins, [place], MD  aspirin EC 81 MG tablet Take 81 mg by mouth daily. breakfast   Yes [provider]  clobetasol cream (TEMOVATE) 3.55 % Apply 1 application as needed topically.   Yes [provider]  cyanocobalamin 1000 MCG tablet Take 1,000 mcg by mouth daily. breakfast   Yes [provider]  DULoxetine (CYMBALTA) 20 MG capsule Take 20 mg daily by mouth.   Yes [provider]  furosemide (LASIX) 20 MG tablet Take 20 mg by mouth daily. May take second dose if needed for swelling.   Yes [provider]  gabapentin (NEURONTIN) 400 MG capsule Take 1 capsule (400 mg total) by mouth at bedtime. Patient taking differently: Take 800 mg at bedtime by mouth.  03/15/13  Yes Regal, Tamala Fothergill, DPM  hydrALAZINE (APRESOLINE) 25 MG tablet Take 1 tablet (25 mg total) 3 (three) times daily by mouth. 04/19/17  Yes Sudini, Alveta Heimlich, MD  metoprolol succinate (TOPROL-XL) 25 MG 24 hr tablet Take 25 mg daily by mouth.   Yes [provider]  pantoprazole (PROTONIX) 40 MG tablet Take 40 mg 2 (two) times daily by mouth.    Yes [provider]  simvastatin (ZOCOR) 20 MG tablet Take 20 mg by mouth daily.    Yes [provider]  solifenacin (VESICARE) 10 MG tablet Take 10 mg by mouth daily. breakfast   Yes [provider]  traMADol (ULTRAM) 50 MG tablet Take 50 mg by mouth every 6 (six) hours as needed.    Yes [provider]  lisinopril (PRINIVIL,ZESTRIL) 5 MG tablet Take 1 tablet (5 mg total) by mouth daily. 08/08/16 11/30/17   Delman Kitten, MD      PHYSICAL EXAMINATION:   VITAL SIGNS: Blood pressure 133/69, pulse 80, temperature 97.8 F (36.6 C), temperature source Oral, resp. rate 19, height 5\' 4"  (1.626 m), weight 74.8 kg, SpO2 90 %.  GENERAL:  84 y.o.-year-old patient lying in the bed with no acute distress.  Frail-appearing EYES: Pupils equal, round, reactive to light and accommodation. No scleral icterus. Extraocular muscles intact.  HEENT: Head atraumatic, normocephalic. Oropharynx and nasopharynx clear.  NECK:  Supple, no jugular venous distention. No thyroid enlargement, no tenderness.  LUNGS: Diffuse rhonchi/crackles up to mid back bilaterally. No use of accessory muscles of respiration.  CARDIOVASCULAR: S1, S2 normal. No murmurs, rubs, or gallops.  ABDOMEN: Soft, nontender, nondistended. Bowel sounds present. No organomegaly or mass.  EXTREMITIES: No pedal edema, cyanosis, or clubbing.  NEUROLOGIC: Cranial nerves  II through XII are intact. Muscle strength 5/5 in all extremities. Sensation intact. Gait not checked.  PSYCHIATRIC: The patient is alert and oriented x 3.  SKIN: No obvious rash, lesion, or ulcer.   LABORATORY PANEL:   CBC Recent Labs  Lab 02/05/2018 1031  WBC 13.4*  HGB 11.7*  HCT 36.3  PLT 282  MCV 79.7*  MCH 25.7*  MCHC 32.2  RDW 17.8*   ------------------------------------------------------------------------------------------------------------------  Chemistries  Recent Labs  Lab 01/12/2018 1031  NA 140  K 3.6  CL 102  CO2 29  GLUCOSE 139*  BUN 17  CREATININE 1.03*  CALCIUM 9.1   ------------------------------------------------------------------------------------------------------------------ estimated creatinine clearance is 41 mL/min (A) (by C-G formula based on SCr of 1.03 mg/dL (H)). ------------------------------------------------------------------------------------------------------------------ No results for input(s): TSH, T4TOTAL, T3FREE, THYROIDAB in the  last 72 hours.  Invalid input(s): FREET3   Coagulation profile No results for input(s): INR, PROTIME in the last 168 hours. ------------------------------------------------------------------------------------------------------------------- No results for input(s): DDIMER in the last 72 hours. -------------------------------------------------------------------------------------------------------------------  Cardiac Enzymes Recent Labs  Lab 01/17/2018 1031  TROPONINI <0.03   ------------------------------------------------------------------------------------------------------------------ Invalid input(s): POCBNP  ---------------------------------------------------------------------------------------------------------------  Urinalysis    Component Value Date/Time   COLORURINE YELLOW (A) 11/30/2017 0801   APPEARANCEUR CLEAR (A) 11/30/2017 0801   APPEARANCEUR Clear 07/06/2011 1402   LABSPEC 1.019 11/30/2017 0801   LABSPEC 1.009 07/06/2011 1402   PHURINE 6.0 11/30/2017 0801   GLUCOSEU NEGATIVE 11/30/2017 0801   GLUCOSEU Negative 07/06/2011 1402   HGBUR NEGATIVE 11/30/2017 0801   BILIRUBINUR NEGATIVE 11/30/2017 0801   BILIRUBINUR Negative 07/06/2011 1402   KETONESUR NEGATIVE 11/30/2017 0801   PROTEINUR NEGATIVE 11/30/2017 0801   UROBILINOGEN 0.2 05/19/2009 1130   NITRITE NEGATIVE 11/30/2017 0801   LEUKOCYTESUR NEGATIVE 11/30/2017 0801   LEUKOCYTESUR 1+ 07/06/2011 1402     RADIOLOGY: Dg Chest Portable 1 View  Result Date: 02/03/2018 CLINICAL DATA:  Shortness of breath beginning today. EXAM: PORTABLE CHEST 1 VIEW COMPARISON:  11/30/2017. FINDINGS: Heart size is normal. Chronic aortic atherosclerosis. Background pattern of chronic interstitial lung disease. Pulmonary opacity is increased diffusely, more on the left than the right. This could be due to superimposed edema or pneumonia. No dense consolidation, lobar collapse or visible effusion. IMPRESSION: Background chronic  interstitial lung disease. Increased interstitial density left more than right could be due to superimposed edema or pneumonia. Electronically Signed   By: Nelson Chimes M.D.   On: 01/17/2018 11:08    EKG: Orders placed or performed during the hospital encounter of 02/11/2018  . ED EKG within 10 minutes  . ED EKG within 10 minutes  . EKG 12-Lead  . EKG 12-Lead    IMPRESSION AND PLAN: *Acute sepsis due to H CAP *Acute H CAP *Acute on COPD exacerbation *Chronic diabetes mellitus type 2  Admit to regular nursing for bed on our sepsis/pneumonia protocol, empiric cefepime/vancomycin, follow-up on cultures, aggressive pulmonary toilet with bronchodilator therapy, mucolytic agents, inhaled corticosteroids twice daily, IV Solu-Medrol with tapering as tolerated, wean O2 off as tolerated, and continue close medical monitoring     All the records are reviewed and case discussed with ED provider. Management plans discussed with the patient, family and they are in agreement.  CODE STATUS:dnr Code Status History    Date Active Date Inactive Code Status Order ID Comments User Context   11/30/2017 2022 12/04/2017 1610 Full Code [id number]  Fritzi Mandes, MD Inpatient   04/18/2017 2345 04/19/2017 2040 Full Code [id number]  Dustin Flock, MD Inpatient   02/13/2017 1625 02/17/2017 1517  Full Code [id number]  Nicholes Mango, MD Inpatient    Advance Directive Documentation     Most Recent Value  Type of Advance Directive  Living will, Healthcare Power of Attorney  Pre-existing out of facility DNR order (yellow form or pink MOST form)  -  "MOST" Form in Place?  -       TOTAL TIME TAKING CARE OF THIS PATIENT: 45 minutes.    Avel Peace Jamicia Haaland M.D on 01/18/2018   Between 7am to 6pm - Pager - [phone]  After 6pm go to [email or web address] - password [email or web address] [facility]  Office  [phone]  CC: Primary care physician; Kirk Ruths, MD   Note: This dictation was prepared with  Dragon dictation along with smaller phrase technology. Any transcriptional errors that result from this process are unintentional.

## 2018-01-19 NOTE — ED Notes (Signed)
Pt to and from CT. ABCs intact. NAD. 

## 2018-01-19 NOTE — ED Provider Notes (Signed)
[facility] Emergency Department Provider Note  ____________________________________________  Time seen: Approximately 10:36 AM  I have reviewed the triage vital signs and the nursing notes.   HISTORY  Chief Complaint Shortness of Breath   HPI [name] is a 84 y.o. female with a history of pulmonary fibrosis on 3 L nasal cannula, CHF with preserved EF, hypertension, hyperlipidemia, diabetes who presents for evaluation of shortness of breath.  Patient reports progressively worsening shortness of breath for a week.  Went to see her primary care doctor yesterday.  Today the shortness of breath became more severe.  She denies chest pain, fever, chills, nausea, vomiting, abdominal pain.  She does report a dry cough for the last week.  Her shortness of breath this morning was severe and constant.  Not responding to her breathing treatments.  Patient was found hypoxic to 60% to by home health nurse.  She received a 2 DuoNeb's in route.  She feels improved at this time.  Past Medical History:  Diagnosis Date  . Anxiety   . Arthritis    hands  . Cancer ([place])   . CHF (congestive heart failure) (HCC)    swelling of feet and legs  . COPD (chronic obstructive pulmonary disease) (HCC)    O2 use at night 2 liters  . DDD (degenerative disc disease), cervical    cervical laminectomy x 2  . Deaf, right   . Depression   . Diabetes mellitus without complication ([place])   . GERD (gastroesophageal reflux disease)   . High cholesterol   . History of hiatal hernia   . Hypertension    controlled on meds  . Neuromuscular disorder (HCC)    neuropathy  . PONV (postoperative nausea and vomiting)   . Shortness of breath dyspnea   . Vertigo    last episode approx 1 yr ago  . Wears dentures    full upper and lower    Patient Active Problem List   Diagnosis Date Noted  . Acute on chronic respiratory failure with hypoxia ([place]) 11/30/2017  . Acute CHF (congestive heart  failure) ([place]) 04/18/2017  . Hypertension 02/16/2017  . Renal artery stenosis ([place]) 02/16/2017  . Acute gastritis without bleeding 02/13/2017    Past Surgical History:  Procedure Laterality Date  . ABDOMINAL HYSTERECTOMY  1963  . APPENDECTOMY    . BACK SURGERY     cervical laminectomy x2  . CATARACT EXTRACTION W/PHACO Right 03/10/2016   Procedure: CATARACT EXTRACTION PHACO AND INTRAOCULAR LENS PLACEMENT (IOC);  Surgeon: Leandrew Koyanagi, MD;  Location: [place];  Service: Ophthalmology;  Laterality: Right;  DIABETIC - oral meds  . CATARACT EXTRACTION W/PHACO Left 04/07/2016   Procedure: CATARACT EXTRACTION PHACO AND INTRAOCULAR LENS PLACEMENT (IOC);  Surgeon: Leandrew Koyanagi, MD;  Location: [place];  Service: Ophthalmology;  Laterality: Left;  DIABETIC, oral med [place]    . ESOPHAGOGASTRODUODENOSCOPY N/A 07/18/2015   Procedure: ESOPHAGOGASTRODUODENOSCOPY (EGD);  Surgeon: Hulen Luster, MD;  Location: [facility] ENDOSCOPY;  Service: Gastroenterology;  Laterality: N/A;  . JOINT REPLACEMENT     hip / also revision  . SHOULDER SURGERY      Prior to Admission medications   Medication Sig Start Date End Date Taking? Authorizing Provider  albuterol (PROVENTIL HFA;VENTOLIN HFA) 108 (90 Base) MCG/ACT inhaler Inhale 2 puffs into the lungs every 6 (six) hours as needed for wheezing or shortness of breath. 11/02/17   Rudene Re, MD  aspirin EC 81 MG tablet  Take 81 mg by mouth daily. breakfast    [provider]  butalbital-acetaminophen-caffeine (FIORICET, ESGIC) 50-325-40 MG tablet Take 1 tablet by mouth every 6 (six) hours as needed for headache or migraine. 02/17/17   Max Sane, MD  clobetasol cream (TEMOVATE) 8.11 % Apply 1 application as needed topically.    [provider]  cyanocobalamin 1000 MCG tablet Take 1,000 mcg by mouth daily. breakfast    [provider]  DULoxetine (CYMBALTA) 20 MG capsule Take 20 mg daily by  mouth.    [provider]  furosemide (LASIX) 20 MG tablet Take 20 mg by mouth daily. May take second dose if needed for swelling.    [provider]  gabapentin (NEURONTIN) 400 MG capsule Take 1 capsule (400 mg total) by mouth at bedtime. Patient taking differently: Take 800 mg at bedtime by mouth.  03/15/13   Wallene Huh, DPM  hydrALAZINE (APRESOLINE) 25 MG tablet Take 1 tablet (25 mg total) 3 (three) times daily by mouth. 04/19/17   Hillary Bow, MD  lisinopril (PRINIVIL,ZESTRIL) 5 MG tablet Take 1 tablet (5 mg total) by mouth daily. 08/08/16 11/30/17  Delman Kitten, MD  metoprolol succinate (TOPROL-XL) 25 MG 24 hr tablet Take 25 mg daily by mouth.    [provider]  pantoprazole (PROTONIX) 40 MG tablet Take 40 mg 2 (two) times daily by mouth.     [provider]  predniSONE (STERAPRED UNI-PAK 21 TAB) 10 MG (21) TBPK tablet 6 tabs day 1 and taper 10 mg a day - 6 days 12/04/17   Hillary Bow, MD  simvastatin (ZOCOR) 20 MG tablet Take 20 mg by mouth daily.     [provider]  solifenacin (VESICARE) 10 MG tablet Take 10 mg by mouth daily. breakfast    [provider]  traMADol (ULTRAM) 50 MG tablet Take every 6 (six) hours as needed by mouth.    [provider]    Allergies Ciprofloxacin; Hydrocodone; Sertraline; and Codeine  No family history on file.  Social History Social History   Tobacco Use  . Smoking status: Former Smoker    Packs/day: 0.25    Years: 20.00    Pack years: 5.00    Types: Cigarettes    Last attempt to quit: 03/14/2000    Years since quitting: 17.8  . Smokeless tobacco: Never Used  Substance Use Topics  . Alcohol use: No  . Drug use: No    Review of Systems  Constitutional: Negative for fever. Eyes: Negative for visual changes. ENT: Negative for sore throat. Neck: No neck pain  Cardiovascular: Negative for chest pain. Respiratory: + shortness of breath, cough Gastrointestinal: Negative for  abdominal pain, vomiting or diarrhea. Genitourinary: Negative for dysuria. Musculoskeletal: Negative for back pain. Skin: Negative for rash. Neurological: Negative for headaches, weakness or numbness. Psych: No SI or HI  ____________________________________________   PHYSICAL EXAM:  VITAL SIGNS: ED Triage Vitals  Enc Vitals Group     BP 01/28/2018 1014 132/81     Pulse Rate 02/05/2018 1014 94     Resp 01/21/2018 1014 (!) 28     Temp 02/04/2018 1014 97.8 F (36.6 C)     Temp Source 01/28/2018 1014 Oral     SpO2 01/24/2018 1007 (!) 80 %     Weight 01/27/2018 1018 165 lb (74.8 kg)     Height 01/30/2018 1018 5\' 4"  (1.626 m)     Head Circumference --      Peak Flow --  Pain Score 01/22/2018 1018 0     Pain Loc --      Pain Edu? --      Excl. in [place]? --     Constitutional: Alert and oriented. Well appearing and in no apparent distress. HEENT:      Head: Normocephalic and atraumatic.         Eyes: Conjunctivae are normal. Sclera is non-icteric.       Mouth/Throat: Mucous membranes are moist.       Neck: Supple with no signs of meningismus. Cardiovascular: Regular rate and rhythm. No murmurs, gallops, or rubs. 2+ symmetrical distal pulses are present in all extremities. Elevated JVD to angle of the jaw Respiratory: Increased work of breathing, hypoxic on 3 L which improved on 5 L nonrebreather, moving good air with faint crackles bilaterally  Gastrointestinal: Soft, non tender, and non distended with positive bowel sounds. No rebound or guarding. Musculoskeletal: Nontender with normal range of motion in all extremities. No edema, cyanosis, or erythema of extremities. Neurologic: Normal speech and language. Face is symmetric. Moving all extremities. No gross focal neurologic deficits are appreciated. Skin: Skin is warm, dry and intact. No rash noted. Psychiatric: Mood and affect are normal. Speech and behavior are normal.  ____________________________________________   LABS (all labs ordered  are listed, but only abnormal results are displayed)  Labs Reviewed  BASIC METABOLIC PANEL - Abnormal; Notable for the following components:      Result Value   Glucose, Bld 139 (*)    Creatinine, Ser 1.03 (*)    GFR calc non Af Amer 49 (*)    GFR calc Af Amer 57 (*)    All other components within normal limits  CBC - Abnormal; Notable for the following components:   WBC 13.4 (*)    Hemoglobin 11.7 (*)    MCV 79.7 (*)    MCH 25.7 (*)    RDW 17.8 (*)    All other components within normal limits  CULTURE, BLOOD (ROUTINE X 2)  CULTURE, BLOOD (ROUTINE X 2)  TROPONIN I  BRAIN NATRIURETIC PEPTIDE  URINALYSIS, ROUTINE W REFLEX MICROSCOPIC  LACTIC ACID, PLASMA  LACTIC ACID, PLASMA   ____________________________________________  EKG  ED ECG REPORT I, Rudene Re, the attending physician, personally viewed and interpreted this ECG.  Normal sinus rhythm, rate of 91, normal intervals, normal axis, minimal ST elevation in V2, no ST depressions.  Unchanged from prior ____________________________________________  RADIOLOGY  I have personally reviewed the images performed during this visit and I agree with the Radiologist's read.   Interpretation by Radiologist:  Dg Chest Portable 1 View  Result Date: 01/13/2018 CLINICAL DATA:  Shortness of breath beginning today. EXAM: PORTABLE CHEST 1 VIEW COMPARISON:  11/30/2017. FINDINGS: Heart size is normal. Chronic aortic atherosclerosis. Background pattern of chronic interstitial lung disease. Pulmonary opacity is increased diffusely, more on the left than the right. This could be due to superimposed edema or pneumonia. No dense consolidation, lobar collapse or visible effusion. IMPRESSION: Background chronic interstitial lung disease. Increased interstitial density left more than right could be due to superimposed edema or pneumonia. Electronically Signed   By: Nelson Chimes M.D.   On: 02/08/2018 11:08        ____________________________________________   PROCEDURES  Procedure(s) performed: None Procedures Critical Care performed:  Yes  CRITICAL CARE Performed by: Rudene Re  ?  Total critical care time: 35 min  Critical care time was exclusive of separately billable procedures and treating other patients.  Critical  care was necessary to treat or prevent imminent or life-threatening deterioration.  Critical care was time spent personally by me on the following activities: development of treatment plan with patient and/or surrogate as well as nursing, discussions with consultants, evaluation of patient's response to treatment, examination of patient, obtaining history from patient or surrogate, ordering and performing treatments and interventions, ordering and review of laboratory studies, ordering and review of radiographic studies, pulse oximetry and re-evaluation of patient's condition.  ____________________________________________   INITIAL IMPRESSION / ASSESSMENT AND PLAN / ED COURSE  84 y.o. female with a history of pulmonary fibrosis on 3 L nasal cannula, CHF with preserved EF, hypertension, hyperlipidemia, diabetes who presents for evaluation of shortness of breath and dry cough.  Patient arrives in moderate respiratory distress, tachypneic, hypoxic on her 3 L nasal cannula which improved once patient was put on a nonrebreather 5 L, she is moving good air and has faint diffuse crackles, also has elevated JVD but no pitting edema.  Differential diagnosis including pneumonia versus COPD/pulmonary fibrosis exacerbation versus CHF.  Chest x-ray is pending.  Labs are pending.  EKG showing no ischemic changes.  Will give DuoNeb x2 and Solu-Medrol    _________________________ 11:43 AM on 02/01/2018 -----------------------------------------  CXR concerning for PNA, elevated WBC, elevated RR concerning for sepsis. Patient admitted to the Hospital 2 months ago therefore will cover  for HCAP with cefepime and vancomycin. Will admit to Hospitalist   As part of my medical decision making, I reviewed the following data within the [place] notes reviewed and incorporated, Labs reviewed , EKG interpreted , Old EKG reviewed, Old chart reviewed, Radiograph reviewed , Discussed with admitting physician , Notes from prior ED visits and [place] Controlled Substance Database    Pertinent labs & imaging results that were available during my care of the patient were reviewed by me and considered in my medical decision making (see chart for details).    ____________________________________________   FINAL CLINICAL IMPRESSION(S) / ED DIAGNOSES  Final diagnoses:  Acute on chronic respiratory failure with hypoxia ([place])  HCAP (healthcare-associated pneumonia)  Sepsis, due to unspecified organism [facility])      NEW MEDICATIONS STARTED DURING THIS VISIT:  ED Discharge Orders    None       Note:  This document was prepared using Dragon voice recognition software and may include unintentional dictation errors.    Alfred Levins, [place], MD 02/10/2018 1145

## 2018-01-19 NOTE — ED Notes (Signed)
100% on NRB, tolerating well.

## 2018-01-19 NOTE — ED Notes (Signed)
Attempted to wean pt off NRB onto [place]. Pt sat dropped to mid/low 80s on [place] at 6L. NRB placed back on pt. Pts O2 sat increased back to 94%.

## 2018-01-19 NOTE — ED Notes (Signed)
Salary MD at bedside

## 2018-01-19 NOTE — Progress Notes (Signed)
Family Meeting Note  Advance Directive:yes  Today a meeting took place with the Patient.  Patient is able to participate   The following clinical team members were present during this meeting:MD  The following were discussed:Patient's diagnosis: Pneumonia, COPD, sepsis, Patient's progosis: Unable to determine and Goals for treatment: DNR  Additional follow-up to be provided: prn  Time spent during discussion:20 minutes  Gorden Harms, MD

## 2018-01-19 NOTE — Progress Notes (Signed)
CODE SEPSIS - PHARMACY COMMUNICATION  **Broad Spectrum Antibiotics should be administered within 1 hour of Sepsis diagnosis**  Time Code Sepsis Called/Page Received: 1148   Antibiotics Ordered: ceftriaxone  Time of 1st antibiotic administration: 1227  If necessary, Name of Provider/Nurse Contacted: not required    Dallie Piles ,PharmD Clinical Pharmacist  01/28/2018  12:35 PM

## 2018-01-19 NOTE — ED Notes (Signed)
Resting in bed, no distress noted, VSS.

## 2018-01-19 NOTE — Progress Notes (Signed)
Pharmacy Antibiotic Note  [name] is a 84 y.o. female admitted on 01/31/2018 with pneumonia.  Pharmacy has been consulted for vancomycin dosing.  Pertinent PMH includes recent hospital admission for pneumonia, CHF, cancer, COPD, DM, She presented to the ED with a one-week history of worsening shortness of breath, satting 60% on 4 L via nasal cannula, CXR shows PNA. There is no recent history of vancomycin therapy to guide dosing.  Plan: Vancomycin 1250mg  IV every 24 hours following a 6 hour stacked dose of 1000mg  in the ED. Ke: 0.045, Vd: 53L, T1/2: 15h, calculated concentrations at steady-state: 43.5/15.5 mcg/mL Goal trough 15-20 mcg/mL., Vt prior to 5th dose  Height: 5\' 4"  (162.6 cm) Weight: 165 lb (74.8 kg) IBW/kg (Calculated) : 54.7  Temp (24hrs), Avg:97.8 F (36.6 C), Min:97.8 F (36.6 C), Max:97.8 F (36.6 C)  Recent Labs  Lab 02/04/2018 1031 01/18/2018 1145  WBC 13.4*  --   CREATININE 1.03*  --   LATICACIDVEN  --  1.7    Estimated Creatinine Clearance: 41 mL/min (A) (by C-G formula based on SCr of 1.03 mg/dL (H)).    Allergies  Allergen Reactions  . Ciprofloxacin Nausea And Vomiting  . Hydrocodone Nausea Only  . Sertraline Nausea And Vomiting  . Codeine Nausea And Vomiting    Antimicrobials this admission: Vancomycin 8/8 >>  Cefepime 8/8 >>   Microbiology results: 8/8 BCx: pending 8/9  MRSA PCR:   Thank you for allowing pharmacy to be a part of this patient's care.  Dallie Piles, PharmD 01/30/2018 1:39 PM

## 2018-01-19 NOTE — ED Notes (Signed)
Yellow armband and socks applied, pt told not to get out of bed without assistance. Bed locked and low, call light within reach.

## 2018-01-20 DIAGNOSIS — J9601 Acute respiratory failure with hypoxia: Secondary | ICD-10-CM

## 2018-01-20 LAB — MRSA PCR SCREENING: MRSA BY PCR: NEGATIVE

## 2018-01-20 LAB — GLUCOSE, CAPILLARY
GLUCOSE-CAPILLARY: 147 mg/dL — AB (ref 70–99)
GLUCOSE-CAPILLARY: 192 mg/dL — AB (ref 70–99)
GLUCOSE-CAPILLARY: 146 mg/dL — AB (ref 70–99)
Glucose-Capillary: 204 mg/dL — ABNORMAL HIGH (ref 70–99)
Glucose-Capillary: 120 mg/dL — ABNORMAL HIGH (ref 70–99)

## 2018-01-20 LAB — PREALBUMIN: Prealbumin: 9.1 mg/dL — ABNORMAL LOW (ref 18–38)

## 2018-01-20 LAB — PROCALCITONIN: Procalcitonin: 0.1 ng/mL

## 2018-01-20 MED ORDER — ENSURE ENLIVE PO LIQD: 237.0000 mL | Freq: Two times a day (BID) | ORAL | Status: DC

## 2018-01-20 MED ORDER — ADULT MULTIVITAMIN W/MINERALS CH
1.0000 | ORAL_TABLET | Freq: Every day | ORAL | Status: DC
  Administered 2018-01-20 – 2018-01-27 (×5): 1 via ORAL
  Filled 2018-01-20 (×5): qty 1

## 2018-01-20 MED ORDER — ALBUTEROL SULFATE (2.5 MG/3ML) 0.083% IN NEBU
3.0000 mL | INHALATION_SOLUTION | Freq: Four times a day (QID) | RESPIRATORY_TRACT | Status: DC
  Administered 2018-01-20 – 2018-01-24 (×16): 3 mL via RESPIRATORY_TRACT
  Filled 2018-01-20 (×16): qty 3

## 2018-01-20 MED ORDER — FUROSEMIDE 10 MG/ML IJ SOLN
40.0000 mg | Freq: Every day | INTRAMUSCULAR | Status: DC
  Administered 2018-01-20 – 2018-01-22 (×3): 40 mg via INTRAVENOUS
  Filled 2018-01-20 (×3): qty 4

## 2018-01-20 MED ORDER — CEFEPIME HCL 1 G IJ SOLR
1.0000 g | Freq: Two times a day (BID) | INTRAMUSCULAR | Status: DC
  Administered 2018-01-20 – 2018-01-23 (×6): 1 g via INTRAVENOUS
  Filled 2018-01-20 (×7): qty 1

## 2018-01-20 NOTE — Plan of Care (Signed)

## 2018-01-20 NOTE — Progress Notes (Signed)
Patient ID: [name], female   DOB: 06-23-1932, 84 y.o.   MRN: [id number]  [place] PROGRESS NOTE  [name] WVP:[id number] DOB: 01/08/1933 DOA: 01/31/2018 PCP: Kirk Ruths, MD  HPI/Subjective: Called by respiratory therapist that the patient is on 90% oxygen high flow nasal cannula.  Patient states that she is breathing a little bit better than when she came in.  Son believes that her oxygen is not working at home.  Patient does have a cough but nonproductive.  Objective: Vitals:   01/20/18 0734 01/20/18 0900  BP:    Pulse:    Resp:    Temp:    SpO2: 90% (!) 88%    Filed Weights   02/06/2018 1018 02/04/2018 1610  Weight: 74.8 kg 77 kg    ROS: Review of Systems  Constitutional: Negative for chills and fever.  Eyes: Negative for blurred vision.  Respiratory: Positive for cough and shortness of breath.   Cardiovascular: Negative for chest pain.  Gastrointestinal: Negative for abdominal pain, constipation, diarrhea, nausea and vomiting.  Genitourinary: Negative for dysuria.  Musculoskeletal: Negative for joint pain.  Neurological: Negative for dizziness and headaches.   Exam: Physical Exam  Constitutional: She is oriented to person, place, and time.  HENT:  Nose: No mucosal edema.  Mouth/Throat: No oropharyngeal exudate or posterior oropharyngeal edema.  Eyes: Pupils are equal, round, and reactive to light. Conjunctivae, EOM and lids are normal.  Neck: No JVD present. Carotid bruit is not present. No edema present. No thyroid mass and no thyromegaly present.  Cardiovascular: S1 normal and S2 normal. Exam reveals no gallop.  No murmur heard. Pulses:      Dorsalis pedis pulses are 2+ on the right side, and 2+ on the left side.  Respiratory: Accessory muscle usage present. No respiratory distress. She has decreased breath sounds in the right middle field, the right lower field, the left middle field and the left lower field. She has no wheezes. She has  rhonchi in the right middle field and the left middle field. She has rales in the right lower field and the left lower field.  GI: Soft. Bowel sounds are normal. There is no tenderness.  Musculoskeletal:       Right ankle: She exhibits no swelling.       Left ankle: She exhibits no swelling.  Lymphadenopathy:    She has no cervical adenopathy.  Neurological: She is alert and oriented to person, place, and time. No cranial nerve deficit.  Skin: Skin is warm. No rash noted. Nails show no clubbing.  Psychiatric: She has a normal mood and affect.      Data Reviewed: Basic Metabolic Panel: Recent Labs  Lab 02/03/2018 1031  NA 140  K 3.6  CL 102  CO2 29  GLUCOSE 139*  BUN 17  CREATININE 1.03*  CALCIUM 9.1   CBC: Recent Labs  Lab 01/28/2018 1031  WBC 13.4*  HGB 11.7*  HCT 36.3  MCV 79.7*  PLT 282   Cardiac Enzymes: Recent Labs  Lab 02/09/2018 1031  TROPONINI <0.03   BNP (last 3 results) Recent Labs    11/02/17 1126 11/30/17 1628 01/23/2018 1036  BNP 188.0* 252.0* 88.0    CBG: Recent Labs  Lab 01/26/2018 1645 01/25/2018 2102 01/20/18 0724  GLUCAP 162* 173* 147*    Recent Results (from the past 240 hour(s))  Blood culture (routine x 2)     Status: None (Preliminary result)   Collection Time: 01/27/2018 11:42 AM  Result  Value Ref Range Status   Specimen Description BLOOD BLOOD RIGHT FOREARM  Final   Special Requests   Final    BOTTLES DRAWN AEROBIC AND ANAEROBIC Blood Culture adequate volume   Culture   Final    NO GROWTH < 24 HOURS Performed at [facility], [address], [place], [place] [address]    Report Status PENDING  Incomplete  Blood culture (routine x 2)     Status: None (Preliminary result)   Collection Time: 02/07/2018 12:09 PM  Result Value Ref Range Status   Specimen Description BLOOD BLOOD LEFT HAND  Final   Special Requests   Final    BOTTLES DRAWN AEROBIC AND ANAEROBIC Blood Culture results may not be optimal due to an inadequate  volume of blood received in culture bottles   Culture   Final    NO GROWTH < 24 HOURS Performed at [facility], [address], [place], [place] [address]    Report Status PENDING  Incomplete  MRSA PCR Screening     Status: None   Collection Time: 01/20/18  6:00 AM  Result Value Ref Range Status   MRSA by PCR NEGATIVE NEGATIVE Final    Comment:        The GeneXpert MRSA Assay (FDA approved for NASAL specimens only), is one component of a comprehensive MRSA colonization surveillance program. It is not intended to diagnose MRSA infection nor to guide or monitor treatment for MRSA infections. Performed at [facility], [address], [place], [place] [address]      Studies: Ct Chest Wo Contrast  Result Date: 02/06/2018 CLINICAL DATA:  Increased shortness of breath this morning. Low O2 sats. History of CHF, COPD, diabetes, hypertension. Former smoker. EXAM: CT CHEST WITHOUT CONTRAST TECHNIQUE: Multidetector CT imaging of the chest was performed following the standard protocol without IV contrast. COMPARISON:  Chest CT dated 11/30/2017. chest CT dated 04/18/2017. FINDINGS: Cardiovascular: Stable cardiomegaly. Trace pericardial effusion. Coronary artery calcifications. Aortic atherosclerosis. No aortic aneurysm. Mediastinum/Nodes: No significant change of the mild mediastinal and perihilar lymphadenopathy, the previously described lymph nodes appearing stable in size and number. No new mass or enlarged lymph nodes within the mediastinum. Esophagus is unremarkable. Enlarged thyroid gland appears stable, compatible with previous description of multinodular goiter. Trachea and central bronchi are unremarkable. Lungs/Pleura: Again noted is extensive interlobular septal thickening and subpleural reticulation throughout both lungs, increased compared to previous exams. The associated traction bronchiectasis within the lower lobes has also worsened. There are increased ground-glass  opacities throughout both lungs, superimposed on the chronic interlobular septal thickening and subpleural reticulation. No pleural effusion or pneumothorax. Upper Abdomen: No acute findings.  Status post cholecystectomy. Musculoskeletal: Mild degenerative spondylitic changes throughout the thoracic spine, most prominent within the lower thoracic spine. No acute or suspicious osseous finding. IMPRESSION: 1. Significantly increased ground-glass opacities throughout both lungs, most likely indicating pulmonary edema and/or multifocal pneumonia superimposed on patient's chronic interstitial lung disease/fibrosis. Differential for ground-glass opacities includes atypical pneumonias such as viral or fungal, interstitial pneumonias, edema related to volume overload/CHF, chronic interstitial diseases, hypersensitivity pneumonitis, and respiratory bronchiolitis. 2. Stable cardiomegaly.  Stable trace pericardial effusion. 3. Chronic mild mediastinal lymphadenopathy, not significantly changed. 4. Stable multinodular goiter. Aortic Atherosclerosis (ICD10-I70.0). Electronically Signed   By: Franki Cabot M.D.   On: 01/31/2018 13:57   Dg Chest Portable 1 View  Result Date: 01/31/2018 CLINICAL DATA:  Shortness of breath beginning today. EXAM: PORTABLE CHEST 1 VIEW COMPARISON:  11/30/2017. FINDINGS: Heart size  is normal. Chronic aortic atherosclerosis. Background pattern of chronic interstitial lung disease. Pulmonary opacity is increased diffusely, more on the left than the right. This could be due to superimposed edema or pneumonia. No dense consolidation, lobar collapse or visible effusion. IMPRESSION: Background chronic interstitial lung disease. Increased interstitial density left more than right could be due to superimposed edema or pneumonia. Electronically Signed   By: Nelson Chimes M.D.   On: 01/23/2018 11:08    Scheduled Meds: . albuterol  3 mL Inhalation Q6H  . aspirin EC  81 mg Oral Daily  . budesonide  (PULMICORT) nebulizer solution  0.5 mg Nebulization BID  . darifenacin  7.5 mg Oral Daily  . DULoxetine  20 mg Oral Daily  . enoxaparin (LOVENOX) injection  40 mg Subcutaneous Q24H  . furosemide  40 mg Intravenous Daily  . gabapentin  400 mg Oral QHS  . hydrALAZINE  25 mg Oral TID  . insulin aspart  0-20 Units Subcutaneous TID WC  . insulin aspart  0-5 Units Subcutaneous QHS  . lisinopril  5 mg Oral Daily  . methylPREDNISolone (SOLU-MEDROL) injection  40 mg Intravenous Q6H  . metoprolol succinate  25 mg Oral Daily  . pantoprazole  40 mg Oral BID  . simvastatin  20 mg Oral Daily  . cyanocobalamin  1,000 mcg Oral Daily   Continuous Infusions: . ceFEPime (MAXIPIME) IV Stopped (01/20/18 4270)  . vancomycin Stopped (01/24/2018 1943)    Assessment/Plan:  1. Acute hypoxic respiratory failure.  Patient on high flow nasal cannula.  With the 15 L of nasal cannula this equals 90% oxygen.  Case discussed with critical care specialist to transfer over to the CCU stepdown for potential BiPAP and closer monitoring of her respiratory status.  Patient is a DO NOT RESUSCITATE.   2. Clinical sepsis secondary to pneumonia.  Patient on Maxipime and vancomycin.  So far first procalcitonin is negative. 3. COPD exacerbation continue high-dose steroids make standing dose nebulizer treatments 4. Acute on chronic diastolic congestive heart failure with IHSS.  Will change Lasix over to IV to try to get respiratory status better. 5. Essential hypertension.  Continue metoprolol, lisinopril and hydralazine 6. GERD on Protonix 7. Hyperlipidemia on Zocor 8. Neuropathy on gabapentin  Code Status:     Code Status Orders  (From admission, onward)         Start     Ordered   01/17/2018 1340  Do not attempt resuscitation (DNR)  Continuous    Question Answer Comment  In the event of cardiac or respiratory ARREST Do not call a "code blue"   In the event of cardiac or respiratory ARREST Do not perform Intubation,  CPR, defibrillation or ACLS   In the event of cardiac or respiratory ARREST Use medication by any route, position, wound care, and other measures to relive pain and suffering. May use oxygen, suction and manual treatment of airway obstruction as needed for comfort.   Comments Nurse may pronounce      01/20/2018 1339        Code Status History    Date Active Date Inactive Code Status Order ID Comments User Context   11/30/2017 2022 12/04/2017 1610 Full Code [id number]  Fritzi Mandes, MD Inpatient   04/18/2017 2345 04/19/2017 2040 Full Code [id number]  Dustin Flock, MD Inpatient   02/13/2017 1625 02/17/2017 1517 Full Code [id number]  Nicholes Mango, MD Inpatient    Advance Directive Documentation     Most Recent Value  Type of Advance Directive  Living will  Pre-existing out of facility DNR order (yellow form or pink MOST form)  -  "MOST" Form in Place?  -     Family Communication: Spoke with son on the phone Disposition Plan: Transferred to CCU stepdown  Consultants:  Critical care specialist  Antibiotics:  Vancomycin  Cefepime  Time spent: 35 minutes including ACP time.  Alia Parsley [employer]

## 2018-01-20 NOTE — Progress Notes (Signed)
Report called to [facility] in CCU. Patient Transferred to CCU 18.

## 2018-01-20 NOTE — Consult Note (Signed)
PULMONARY / CRITICAL CARE MEDICINE   Name: [name] MRN: [id number] DOB: 06/11/1933    ADMISSION DATE:  01/14/2018 CONSULTATION DATE:  01/18/2018  REFERRING MD:  Dr. Leslye Peer  CHIEF COMPLAINT:  Shortness of Breath  HISTORY OF PRESENT ILLNESS:   [name] is a 84 year old female with a past medical history listed below, who presented to [facility] ED on 02/07/2018 with complaints of shortness of breath and cough, that was not responsive to breathing treatments.  She reports that the shortness of breath been progressive over about a week.  At home, she was noted to be hypoxic O2 sats 60% on 4 L cannula.  Initial CXR in the ED was concerning for pneumonia.  She was admitted to [facility] for further treatment of sepsis, HCAP, COPD exacerbation, and Acute on Chronic Diastolic CHF  On 12/20/37, patient with increasing O2 requirements (90% HFNC).  She was therefore transferred to the ICU for closer monitoring and for the potential of BiPAP. PCCM consulted for further management.  PAST MEDICAL HISTORY :  She  has a past medical history of Anxiety, Arthritis, Cancer ([name]), CHF (congestive heart failure) ([name]), COPD (chronic obstructive pulmonary disease) ([name]), DDD (degenerative disc disease), cervical, Deaf, right, Depression, Diabetes mellitus without complication ([place]), GERD (gastroesophageal reflux disease), High cholesterol, History of hiatal hernia, Hypertension, Neuromuscular disorder ([name]), PONV (postoperative nausea and vomiting), Shortness of breath dyspnea, Vertigo, and Wears dentures.  PAST SURGICAL HISTORY: She  has a past surgical history that includes Cholecystectomy; Appendectomy; Shoulder surgery; Esophagogastroduodenoscopy (N/A, 07/18/2015); Cataract extraction w/PHACO (Right, 03/10/2016); Joint replacement; Back surgery; Abdominal hysterectomy (1963); and Cataract extraction w/PHACO (Left, 04/07/2016).  Allergies  Allergen Reactions  . Ciprofloxacin Nausea And Vomiting  . Hydrocodone Nausea Only   . Sertraline Nausea And Vomiting  . Codeine Nausea And Vomiting    Current Facility-Administered Medications on File Prior to Encounter  Medication  . fentaNYL (SUBLIMAZE) injection  . midazolam (VERSED) injection  . propofol (DIPRIVAN) 10 mg/mL bolus/IV push  . propofol (DIPRIVAN) 500 MG/50ML infusion   Current Outpatient Medications on File Prior to Encounter  Medication Sig  . albuterol (PROVENTIL HFA;VENTOLIN HFA) 108 (90 Base) MCG/ACT inhaler Inhale 2 puffs into the lungs every 6 (six) hours as needed for wheezing or shortness of breath.  Marland Kitchen aspirin EC 81 MG tablet Take 81 mg by mouth daily. breakfast  . clobetasol cream (TEMOVATE) 0.30 % Apply 1 application as needed topically.  . cyanocobalamin 1000 MCG tablet Take 1,000 mcg by mouth daily. breakfast  . DULoxetine (CYMBALTA) 20 MG capsule Take 20 mg daily by mouth.  . furosemide (LASIX) 20 MG tablet Take 20 mg by mouth daily. May take second dose if needed for swelling.  . gabapentin (NEURONTIN) 400 MG capsule Take 1 capsule (400 mg total) by mouth at bedtime. (Patient taking differently: Take 800 mg at bedtime by mouth. )  . hydrALAZINE (APRESOLINE) 25 MG tablet Take 1 tablet (25 mg total) 3 (three) times daily by mouth.  . metoprolol succinate (TOPROL-XL) 25 MG 24 hr tablet Take 25 mg daily by mouth.  . pantoprazole (PROTONIX) 40 MG tablet Take 40 mg 2 (two) times daily by mouth.   . simvastatin (ZOCOR) 20 MG tablet Take 20 mg by mouth daily.   . solifenacin (VESICARE) 10 MG tablet Take 10 mg by mouth daily. breakfast  . traMADol (ULTRAM) 50 MG tablet Take 50 mg by mouth every 6 (six) hours as needed.   Marland Kitchen lisinopril (PRINIVIL,ZESTRIL) 5 MG tablet Take 1 tablet (  5 mg total) by mouth daily.    FAMILY HISTORY:  Her family history is not on file.  SOCIAL HISTORY: She  reports that she quit smoking about 17 years ago. Her smoking use included cigarettes. She has a 5.00 pack-year smoking history. She has never used smokeless  tobacco. She reports that she does not drink alcohol or use drugs.  REVIEW OF SYSTEMS:   Positives in BOLD: Gen: Denies fever, chills, weight change, fatigue, night sweats HEENT: Denies blurred vision, double vision, hearing loss, tinnitus, sinus congestion, rhinorrhea, sore throat, neck stiffness, dysphagia PULM: Denies +shortness of breath, +cough, sputum production, hemoptysis, wheezing CV: Denies chest pain, +edema, orthopnea, paroxysmal nocturnal dyspnea, palpitations GI: Denies abdominal pain, nausea, vomiting, diarrhea, hematochezia, melena, constipation, change in bowel habits GU: Denies dysuria, hematuria, polyuria, oliguria, urethral discharge Endocrine: Denies hot or cold intolerance, polyuria, polyphagia or appetite change Derm: Denies rash, dry skin, scaling or peeling skin change Heme: Denies easy bruising, bleeding, bleeding gums Neuro: Denies headache, numbness, weakness, slurred speech, loss of memory or consciousness   SUBJECTIVE:  Complains of mild SOB Report nonproductive cough Denies pain Being placed on HFNC  VITAL SIGNS: BP 123/74 (BP Location: Right Arm)   Pulse 95   Temp 97.9 F (36.6 C) (Oral)   Resp (!) 24   Ht 5\' 4"  (1.626 m)   Wt 78.7 kg   SpO2 (!) 89%   BMI 29.78 kg/m   HEMODYNAMICS:    VENTILATOR SETTINGS:    INTAKE / OUTPUT: I/O last 3 completed shifts: In: 990.1 [P.O.:240; IV Piggyback:750.1] Out: 300 [Urine:300]  PHYSICAL EXAMINATION: General:  Acutely ill appearing female, laying in bed, being placed on HFNC from NRB, in mild respiratory distress Neuro:  Awake, A&O x4, follows commands, no focal deficits, clear speech HEENT:  Atraumatic, normocephalic, No JVD, MM pink/moist Cardiovascular:  RRR, s1s2, No M/R/G, 2+ pulses throughout Lungs:  Coarse breath sounds bilaterally, fine crackles in bilateral bases, no wheezing, even, symmetrical, no assessory muscle use Abdomen:  Obese, soft, nondistended, BS+ x4 Musculoskeletal:  No  deformities, normal bulk and tone Skin:  Warm/dry, No obvious rashes, lesions, or ulcerations  LABS:  BMET Recent Labs  Lab 02/10/2018 1031  NA 140  K 3.6  CL 102  CO2 29  BUN 17  CREATININE 1.03*  GLUCOSE 139*    Electrolytes Recent Labs  Lab 01/15/2018 1031  CALCIUM 9.1    CBC Recent Labs  Lab 01/14/2018 1031  WBC 13.4*  HGB 11.7*  HCT 36.3  PLT 282    Coag's No results for input(s): APTT, INR in the last 168 hours.  Sepsis Markers Recent Labs  Lab 02/01/2018 1145 01/20/18 0431  LATICACIDVEN 1.7  --   PROCALCITON  --  0.10    ABG No results for input(s): PHART, PCO2ART, PO2ART in the last 168 hours.  Liver Enzymes No results for input(s): AST, ALT, ALKPHOS, BILITOT, ALBUMIN in the last 168 hours.  Cardiac Enzymes Recent Labs  Lab 01/28/2018 1031  TROPONINI <0.03    Glucose Recent Labs  Lab 01/25/2018 1645 02/06/2018 2102 01/20/18 0724 01/20/18 1113  GLUCAP 162* 173* 147* 192*    Imaging Ct Chest Wo Contrast  Result Date: 01/17/2018 CLINICAL DATA:  Increased shortness of breath this morning. Low O2 sats. History of CHF, COPD, diabetes, hypertension. Former smoker. EXAM: CT CHEST WITHOUT CONTRAST TECHNIQUE: Multidetector CT imaging of the chest was performed following the standard protocol without IV contrast. COMPARISON:  Chest CT dated 11/30/2017. chest CT dated  04/18/2017. FINDINGS: Cardiovascular: Stable cardiomegaly. Trace pericardial effusion. Coronary artery calcifications. Aortic atherosclerosis. No aortic aneurysm. Mediastinum/Nodes: No significant change of the mild mediastinal and perihilar lymphadenopathy, the previously described lymph nodes appearing stable in size and number. No new mass or enlarged lymph nodes within the mediastinum. Esophagus is unremarkable. Enlarged thyroid gland appears stable, compatible with previous description of multinodular goiter. Trachea and central bronchi are unremarkable. Lungs/Pleura: Again noted is extensive  interlobular septal thickening and subpleural reticulation throughout both lungs, increased compared to previous exams. The associated traction bronchiectasis within the lower lobes has also worsened. There are increased ground-glass opacities throughout both lungs, superimposed on the chronic interlobular septal thickening and subpleural reticulation. No pleural effusion or pneumothorax. Upper Abdomen: No acute findings.  Status post cholecystectomy. Musculoskeletal: Mild degenerative spondylitic changes throughout the thoracic spine, most prominent within the lower thoracic spine. No acute or suspicious osseous finding. IMPRESSION: 1. Significantly increased ground-glass opacities throughout both lungs, most likely indicating pulmonary edema and/or multifocal pneumonia superimposed on patient's chronic interstitial lung disease/fibrosis. Differential for ground-glass opacities includes atypical pneumonias such as viral or fungal, interstitial pneumonias, edema related to volume overload/CHF, chronic interstitial diseases, hypersensitivity pneumonitis, and respiratory bronchiolitis. 2. Stable cardiomegaly.  Stable trace pericardial effusion. 3. Chronic mild mediastinal lymphadenopathy, not significantly changed. 4. Stable multinodular goiter. Aortic Atherosclerosis (ICD10-I70.0). Electronically Signed   By: Franki Cabot M.D.   On: 01/17/2018 13:57     STUDIES:  CT Chest 01/26/2018>> Significantly increased ground-glass opacities throughout both lungs, most likely indicating pulmonary edema and/or multifocal pneumonia superimposed on patient's chronic interstitial lung disease/fibrosis. Differential for ground-glass opacities includesatypical pneumonias such as viral or fungal, interstitial pneumonias, edema related to volume overload/CHF, chronicinterstitial diseases, hypersensitivity pneumonitis, and respiratoryBronchiolitis. Stable cardiomegaly.  Stable trace pericardial effusion. Chronic mild mediastinal  lymphadenopathy, not significantly changed.Stable multinodular goiter.    CULTURES: Blood x2 8/8>> Sputum 8/8>> Strep Pneumo urinary antigen 8/8>> Legionella urinary antigen 8/8>>  ANTIBIOTICS: Vancomycin 8/8>>8/9 Cefepime 8/8>>  SIGNIFICANT EVENTS: 02/07/2018>> Admission to [facility] 01/20/18>> Transfer to ICU for increasing O2 requirements, PCCM consulted  LINES/TUBES:  DISCUSSION: 84 y.o. Female with Acute Hypoxic Respiratory failure secondary to HCAP, AECOPD, CHF exacerbation.  Transferred to ICU for closer monitoring and potential BiPAP.  Had discussion regarding CODE status, pt confirms that she is DNR/DNI, BiPAP is acceptable if necessary.  ASSESSMENT / PLAN:  PULMONARY A: Acute Hypoxic Respiratory Failure in setting of HCAP, AECOPD, CHF exacerbation P:   Supplemental O2 to maintain O2 sats 88 to 92% HFNC BiPAP if needed Continue Albuterol, budesonide Solu-medrol 40 mg daily Follow intermittent CXR   CARDIOVASCULAR A:  Acute on Chronic diastolic CHF Hx: HTN, HLD P:  ICU monitoring IV Lasix Continue Metoprolol & Hydralazine  RENAL A:   No active issues P:   Monitor I&O's / urinary output Follow BMP closely with diuresis Ensure adequate renal perfusion Avoid nephrotoxic agents as able Replace electrolytes as indicated  GASTROINTESTINAL A:   No active issues Hx: GERD P:   Diet as tolerated Continue PO Protonix  HEMATOLOGIC A:   Anemia without evidence of bleeding P:  Monitor for s/sx of bleeding Trend CBC Lovenox for VTE prophylaxis Transfuse for Hgb<7  INFECTIOUS A:   HCAP P:   Monitor fever curve Trend WBC Trend PCT Follow cultures Continue Cefepime, will d/c Vancomycin 8/9 given MRSA PCR negative  ENDOCRINE A:   Diabetes Mellitus   P:   CBG's SSI Follow ICU hypo/hyperglycemia protocol  NEUROLOGIC A:   No active issues  Hx: anxiety, depression P:   Provide supportive care Lights on during the day Promote wake/sleep  cycle Continue home Duloxetine, gabapentin   FAMILY  - Updates: Updated pt at bedside 01/20/18  - Inter-disciplinary family meet or Palliative Care meeting due by:  01/27/18  Goals of Care: DNR/DNI    Darel Hong, [facility] [place] Pager: [phone]   01/20/2018, 11:52 AM

## 2018-01-20 NOTE — Care Management (Signed)
RNCM updated [name] with [facility] that patient has been transferred to ICU on [place].

## 2018-01-20 NOTE — Care Management Note (Signed)
Case Management Note  Patient Details  Name: LOVELY KERINS MRN: [id number] Date of Birth: 1932/08/26  Subjective/Objective:  Admitted to [facility] with the diagnosis of pneumonia. Lives with son. Daughter is Yosselyn [phone]).  Last seen Dr. Ouida Sills 12/28/17. [facility] was arranged 12/08/17. No skilled facility. Home oxygen 3 liters. Home oxygen per [facility]. Takes care of all  Basic activities of daily living herself, drives.  Placed on HFNC 90%                 Action/Plan: Will continue to follow for discharge plans    Expected Discharge Date:  01/23/18               Expected Discharge Plan:     In-House Referral:   yes  Discharge planning Services     Post Acute Care Choice:    Choice offered to:     DME Arranged:    DME Agency:     HH Arranged:    HH Agency:     Status of Service:     If discussed at [employer], dates discussed:    Additional Comments:  Shelbie Ammons, RN MSN CCM Care Management [phone] 01/20/2018, 10:42 AM

## 2018-01-20 NOTE — Progress Notes (Signed)
Initial Nutrition Assessment  DOCUMENTATION CODES:   Not applicable  INTERVENTION:   Ensure Enlive po BID, each supplement provides 350 kcal and 20 grams of protein  MVI daily  Liberalize diet  NUTRITION DIAGNOSIS:   Increased nutrient needs related to chronic illness(COPD, pulmonary fibrosis) as evidenced by increased estimated needs.  GOAL:   Patient will meet greater than or equal to 90% of their needs  MONITOR:   PO intake, Supplement acceptance, Labs, Weight trends, Skin, I & O's  REASON FOR ASSESSMENT:   Consult Assessment of nutrition requirement/status  ASSESSMENT:   84 y.o. female with a history of pulmonary fibrosis on 3 L nasal cannula, CHF with preserved EF, hypertension, hyperlipidemia, diabetes who presents for evaluation of shortness of breath.   Met with pt in room today. Pt reports poor appetite and oral intake for about 1 week pta. Pt currently eating 90% of her meals in hospital. Pt does not drink any supplements at home but would like to have strawberry Ensure while here. RD discussed with pt the importance of adequate calorie and protein intake needed to preserve lean muscle. Per chart, pt is weight stable. RD will add supplements and MVI to help pt meet her estimated protein needs. RD will liberalize the heart healthy portion of pt's diet as this restricts protein also.   Medications reviewed and include: aspirin, lovenox, lasix, insulin, solu-medrol, protonix, B12, cefepime, vancomycin  Labs reviewed: prealbumin 9.1(L) Wbc- 13.4(H) cbgs- 147, 192, 204 x 24 hrs  NUTRITION - FOCUSED PHYSICAL EXAM:    Most Recent Value  Orbital Region  No depletion  Upper Arm Region  No depletion  Thoracic and Lumbar Region  No depletion  Buccal Region  No depletion  Temple Region  No depletion  Clavicle Bone Region  No depletion  Clavicle and Acromion Bone Region  No depletion  Scapular Bone Region  No depletion  Dorsal Hand  No depletion  Patellar Region  No  depletion  Anterior Thigh Region  No depletion  Posterior Calf Region  No depletion  Edema (RD Assessment)  Mild  Hair  Reviewed  Eyes  Reviewed  Mouth  Reviewed  Skin  Reviewed  Nails  Reviewed     Diet Order:   Diet Order            Diet Carb Modified Fluid consistency: Thin; Room service appropriate? Yes; Fluid restriction: 1500 mL Fluid  Diet effective now             EDUCATION NEEDS:   Education needs have been addressed  Skin:  Skin Assessment: Reviewed RN Assessment(ecchymosis )  Last BM:  8/7  Height:   Ht Readings from Last 1 Encounters:  01/20/18 5' 4" (1.626 m)    Weight:   Wt Readings from Last 1 Encounters:  01/20/18 78.7 kg    Ideal Body Weight:  54.5 kg  BMI:  Body mass index is 29.78 kg/m.  Estimated Nutritional Needs:   Kcal:  1500-1700kcal/day   Protein:  77-85g/day   Fluid:  >1.5L/day or per MD  Koleen Distance MS, RD, LDN Pager #- [phone] Office#- [phone] After Hours Pager: [phone]

## 2018-01-20 NOTE — Evaluation (Signed)
Clinical/Bedside Swallow Evaluation Patient Details  Name: [name] MRN: [id number] Date of Birth: [birth year]  Today's Date: 01/20/2018 Time: SLP Start Time (ACUTE ONLY): 1200 SLP Stop Time (ACUTE ONLY): 1245 SLP Time Calculation (min) (ACUTE ONLY): 45 min  Past Medical History:  Past Medical History:  Diagnosis Date  . Anxiety   . Arthritis    hands  . Cancer ([place])   . CHF (congestive heart failure) (HCC)    swelling of feet and legs  . COPD (chronic obstructive pulmonary disease) (HCC)    O2 use at night 2 liters  . DDD (degenerative disc disease), cervical    cervical laminectomy x 2  . Deaf, right   . Depression   . Diabetes mellitus without complication ([place])   . GERD (gastroesophageal reflux disease)   . High cholesterol   . History of hiatal hernia   . Hypertension    controlled on meds  . Neuromuscular disorder (HCC)    neuropathy  . PONV (postoperative nausea and vomiting)   . Shortness of breath dyspnea   . Vertigo    last episode approx 1 yr ago  . Wears dentures    full upper and lower   Past Surgical History:  Past Surgical History:  Procedure Laterality Date  . ABDOMINAL HYSTERECTOMY  1963  . APPENDECTOMY    . BACK SURGERY     cervical laminectomy x2  . CATARACT EXTRACTION W/PHACO Right 03/10/2016   Procedure: CATARACT EXTRACTION PHACO AND INTRAOCULAR LENS PLACEMENT (IOC);  Surgeon: Leandrew Koyanagi, MD;  Location: [facility];  Service: Ophthalmology;  Laterality: Right;  DIABETIC - oral meds  . CATARACT EXTRACTION W/PHACO Left 04/07/2016   Procedure: CATARACT EXTRACTION PHACO AND INTRAOCULAR LENS PLACEMENT (IOC);  Surgeon: Leandrew Koyanagi, MD;  Location: [facility];  Service: Ophthalmology;  Laterality: Left;  DIABETIC, oral med [place]    . ESOPHAGOGASTRODUODENOSCOPY N/A 07/18/2015   Procedure: ESOPHAGOGASTRODUODENOSCOPY (EGD);  Surgeon: Hulen Luster, MD;  Location: [facility] ENDOSCOPY;  Service:  Gastroenterology;  Laterality: N/A;  . JOINT REPLACEMENT     hip / also revision  . SHOULDER SURGERY     HPI:  Per admitting H&P: [name]  is a 84 y.o. female with a known history per below, recent hospital admission for pneumonia, presented to the emergency room with a one-week history of worsening shortness of breath with associated cough, no better with breathing treatments, patient was noted to be satting 60% on 4 L via nasal cannula as checked by her home health nurse, patient was brought to the emergency room via EMS, did receive DuoNeb's, patient placed on nonrebreather in the emergency room with O2 saturation in the 80s, noted tachypnea, work-up noted for pneumonia on chest x-ray, evaluated emergency room, daughter at the bedside, patient now be admitted for acute sepsis due to acute H CAP.   Assessment / Plan / Recommendation Clinical Impression  Patient demonstrated no overt s/s oropharyngeal dysphagia. Patient tolerated all consistencies with no immediate s/s aspiration or laryngeal penetration. Observed patient with strong coughs and throat clears intermittently for approximately 10 minutes prior to PO trials. Noted 1 delayed throat clear and 1 delayed cough throughout 30 minutes of PO trials. Noted timely initiation of every swallow. Patient's vocal quality appeared clear and laryngeal elevation appeared adequate throughout evaluation. Patient reports history of GERD and regular esophageal dilatation (approximately every 6 months). Daughter reports patient missed her most recent appointment for dilatation due to illness and had not rescheduled  again yet. Patient reports she doesn't have trouble with food going down if she takes small bites, eats slowly, and regularly drinks a sip of liquid following 1-2 small bites of food. SLP observed patient strictly following these swallow precautions. Recommend continue with current regular diet with thin liquids, meds to be given whole with thin  liquid, strict aspiration precautions, including remaining seated upright for 90 minutes after PO intake and continue with safe swallow precautions of small bites and sips, alternating bites of food with sips of liquid, and minimize environmental distractions. SLP to f/u with toleration of diet. SLP Visit Diagnosis: Dysphagia, unspecified (R13.10)    Aspiration Risk  Mild aspiration risk    Diet Recommendation Regular;Thin liquid   Liquid Administration via: Cup;Straw Medication Administration: Whole meds with liquid Supervision: Patient able to self feed;Intermittent supervision to cue for compensatory strategies Compensations: Minimize environmental distractions;Slow rate;Small sips/bites;Follow solids with liquid Postural Changes: Seated upright at 90 degrees;Remain upright for at least 30 minutes after po intake    Other  Recommendations Oral Care Recommendations: Oral care BID   Follow up Recommendations        Frequency and Duration min 1 x/week  1 week       Prognosis Prognosis for Safe Diet Advancement: Good      Swallow Study   General Date of Onset: 01/20/18 HPI: Per admitting H&P: [name]  is a 84 y.o. female with a known history per below, recent hospital admission for pneumonia, presented to the emergency room with a one-week history of worsening shortness of breath with associated cough, no better with breathing treatments, patient was noted to be satting 60% on 4 L via nasal cannula as checked by her home health nurse, patient was brought to the emergency room via EMS, did receive DuoNeb's, patient placed on nonrebreather in the emergency room with O2 saturation in the 80s, noted tachypnea, work-up noted for pneumonia on chest x-ray, evaluated emergency room, daughter at the bedside, patient now be admitted for acute sepsis due to acute H CAP. Type of Study: Bedside Swallow Evaluation Diet Prior to this Study: Regular;Thin liquids Respiratory Status: Other  (comment) History of Recent Intubation: (High flow nasal cannula) Behavior/Cognition: Alert;Cooperative;Pleasant mood Oral Cavity Assessment: Dry Oral Cavity - Dentition: Dentures, not available Self-Feeding Abilities: Able to feed self Patient Positioning: Upright in bed Baseline Vocal Quality: Hoarse Volitional Cough: Strong Volitional Swallow: Able to elicit    Oral/Motor/Sensory Function Overall Oral Motor/Sensory Function: Within functional limits   Ice Chips     Thin Liquid Thin Liquid: Within functional limits Presentation: Self Fed;Straw    Nectar Thick     Honey Thick     Puree Puree: Within functional limits Presentation: Self Fed   Solid     Solid: Within functional limits Presentation: Self Fed      Nolan Tuazon, MA, CCC-SLP 01/20/2018,1:21 PM

## 2018-01-20 NOTE — Progress Notes (Signed)
Patient ID: [name], female   DOB: Aug 14, 1932, 84 y.o.   MRN: [id number]  ACP note  Diagnosis: Acute on chronic hypoxic respiratory failure, clinical sepsis with pneumonia, COPD exacerbation, acute diastolic CHF with IHSS.  CODE STATUS confirmed DNR  Plan.  Try to get respiratory status as good as possible.  Transfer to the CCU stepdown for further monitoring and closer monitoring.  Continue IV steroids and antibiotics.  Change Lasix to IV just in case congestive heart failure contributing.  Time spent on ACP discussion 17 minutes Dr. Loletha Grayer

## 2018-01-21 ENCOUNTER — Inpatient Hospital Stay: Payer: Medicare Other

## 2018-01-21 LAB — HIV ANTIBODY (ROUTINE TESTING W REFLEX): HIV Screen 4th Generation wRfx: NONREACTIVE

## 2018-01-21 LAB — BASIC METABOLIC PANEL
ANION GAP: 9 (ref 5–15)
BUN: 28 mg/dL — ABNORMAL HIGH (ref 8–23)
CHLORIDE: 101 mmol/L (ref 98–111)
CO2: 31 mmol/L (ref 22–32)
Calcium: 9.6 mg/dL (ref 8.9–10.3)
Creatinine, Ser: 0.99 mg/dL (ref 0.44–1.00)
GFR calc Af Amer: 59 mL/min — ABNORMAL LOW (ref >60)
GFR calc non Af Amer: 51 mL/min — ABNORMAL LOW (ref >60)
GLUCOSE: 143 mg/dL — AB (ref 70–99)
POTASSIUM: 3.9 mmol/L (ref 3.5–5.1)
Sodium: 141 mmol/L (ref 135–145)

## 2018-01-21 LAB — CBC
HCT: 38.7 % (ref 35.0–47.0)
Hemoglobin: 12.1 g/dL (ref 12.0–16.0)
MCH: 25 pg — ABNORMAL LOW (ref 26.0–34.0)
MCHC: 31.4 g/dL — ABNORMAL LOW (ref 32.0–36.0)
MCV: 79.7 fL — AB (ref 80.0–100.0)
Platelets: 334 10*3/uL (ref 150–440)
RBC: 4.85 MIL/uL (ref 3.80–5.20)
RDW: 17.5 % — AB (ref 11.5–14.5)
WBC: 37 10*3/uL — AB (ref 3.6–11.0)

## 2018-01-21 LAB — GLUCOSE, CAPILLARY
GLUCOSE-CAPILLARY: 147 mg/dL — AB (ref 70–99)
GLUCOSE-CAPILLARY: 169 mg/dL — AB (ref 70–99)
Glucose-Capillary: 127 mg/dL — ABNORMAL HIGH (ref 70–99)
Glucose-Capillary: 141 mg/dL — ABNORMAL HIGH (ref 70–99)
Glucose-Capillary: 143 mg/dL — ABNORMAL HIGH (ref 70–99)
Glucose-Capillary: 149 mg/dL — ABNORMAL HIGH (ref 70–99)

## 2018-01-21 LAB — PROCALCITONIN: Procalcitonin: <0.1 ng/mL

## 2018-01-21 MED ORDER — SODIUM CHLORIDE 0.9 % IV SOLN
INTRAVENOUS | Status: DC
  Administered 2018-01-21: 10:00:00 via INTRAVENOUS

## 2018-01-21 MED ORDER — FAMOTIDINE IN NACL 20-0.9 MG/50ML-% IV SOLN
20.0000 mg | INTRAVENOUS | Status: DC
  Administered 2018-01-21 – 2018-01-23 (×3): 20 mg via INTRAVENOUS
  Filled 2018-01-21 (×2): qty 50

## 2018-01-21 MED ORDER — PRO-STAT SUGAR FREE PO LIQD: 30.0000 mL | Freq: Two times a day (BID) | ORAL | Status: DC

## 2018-01-21 MED ORDER — FAMOTIDINE IN NACL 20-0.9 MG/50ML-% IV SOLN: 20.0000 mg | Freq: Two times a day (BID) | INTRAVENOUS | Status: DC

## 2018-01-21 MED ORDER — HYDRALAZINE HCL 20 MG/ML IJ SOLN
10.0000 mg | INTRAMUSCULAR | Status: DC | PRN
  Administered 2018-01-22 – 2018-01-27 (×9): 10 mg via INTRAVENOUS
  Filled 2018-01-21 (×8): qty 1

## 2018-01-21 MED ORDER — VITAL HIGH PROTEIN PO LIQD: 1000.0000 mL | ORAL | Status: DC

## 2018-01-21 MED ORDER — METOPROLOL TARTRATE 5 MG/5ML IV SOLN
5.0000 mg | Freq: Four times a day (QID) | INTRAVENOUS | Status: DC | PRN
  Administered 2018-01-21 (×2): 5 mg via INTRAVENOUS
  Filled 2018-01-21 (×2): qty 5

## 2018-01-21 MED ORDER — METOPROLOL TARTRATE 25 MG PO TABS
25.0000 mg | ORAL_TABLET | Freq: Two times a day (BID) | ORAL | Status: DC
  Administered 2018-01-23: 25 mg
  Filled 2018-01-21: qty 1

## 2018-01-21 MED ORDER — FUROSEMIDE 10 MG/ML IJ SOLN
40.0000 mg | Freq: Once | INTRAMUSCULAR | Status: AC
  Administered 2018-01-21: 40 mg via INTRAVENOUS
  Filled 2018-01-21: qty 4

## 2018-01-21 MED ORDER — DIPHENHYDRAMINE HCL 50 MG/ML IJ SOLN
50.0000 mg | Freq: Once | INTRAMUSCULAR | Status: AC
  Administered 2018-01-21: 50 mg via INTRAVENOUS
  Filled 2018-01-21: qty 1

## 2018-01-21 NOTE — Progress Notes (Signed)
Patient ID: [name], female   DOB: Sep 06, 1932, 84 y.o.   MRN: [id number]  [facility] PROGRESS NOTE  NATALINE BASARA EHU:[id number] DOB: 11/14/1932 DOA: 02/04/2018 PCP: Kirk Ruths, MD  HPI/Subjective: Patient on BiPAP continues to be very short of breath  Objective: Vitals:   01/21/18 1423 01/21/18 1502  BP:  (!) 147/60  Pulse:  88  Resp:  18  Temp:    SpO2: 97%     Filed Weights   02/01/2018 1018 02/11/2018 1610 01/20/18 1140  Weight: 74.8 kg 77 kg 78.7 kg    ROS: Review of Systems  Constitutional: Negative for chills and fever.  Eyes: Negative for blurred vision.  Respiratory: Positive for cough and shortness of breath.   Cardiovascular: Negative for chest pain.  Gastrointestinal: Negative for abdominal pain, constipation, diarrhea, nausea and vomiting.  Genitourinary: Negative for dysuria.  Musculoskeletal: Negative for joint pain.  Neurological: Negative for dizziness and headaches.   Exam: Physical Exam  Constitutional: She is oriented to person, place, and time.  HENT:  Nose: No mucosal edema.  Mouth/Throat: No oropharyngeal exudate or posterior oropharyngeal edema.  Eyes: Pupils are equal, round, and reactive to light. Conjunctivae, EOM and lids are normal.  Neck: No JVD present. Carotid bruit is not present. No edema present. No thyroid mass and no thyromegaly present.  Cardiovascular: S1 normal and S2 normal. Exam reveals no gallop.  No murmur heard. Pulses:      Dorsalis pedis pulses are 2+ on the right side, and 2+ on the left side.  Respiratory: Accessory muscle usage present. No respiratory distress. She has decreased breath sounds in the right middle field, the right lower field, the left middle field and the left lower field. She has no wheezes. She has rhonchi in the right middle field and the left middle field. She has rales in the right lower field and the left lower field.  GI: Soft. Bowel sounds are normal. There is no tenderness.   Musculoskeletal:       Right ankle: She exhibits no swelling.       Left ankle: She exhibits no swelling.  Lymphadenopathy:    She has no cervical adenopathy.  Neurological: She is alert and oriented to person, place, and time. No cranial nerve deficit.  Skin: Skin is warm. No rash noted. Nails show no clubbing.  Psychiatric: She has a normal mood and affect.      Data Reviewed: Basic Metabolic Panel: Recent Labs  Lab 01/24/2018 1031 01/21/18 0530  NA 140 141  K 3.6 3.9  CL 102 101  CO2 29 31  GLUCOSE 139* 143*  BUN 17 28*  CREATININE 1.03* 0.99  CALCIUM 9.1 9.6   CBC: Recent Labs  Lab 02/02/2018 1031 01/21/18 0530  WBC 13.4* 37.0*  HGB 11.7* 12.1  HCT 36.3 38.7  MCV 79.7* 79.7*  PLT 282 334   Cardiac Enzymes: Recent Labs  Lab 02/06/2018 1031  TROPONINI <0.03   BNP (last 3 results) Recent Labs    11/02/17 1126 11/30/17 1628 01/18/2018 1036  BNP 188.0* 252.0* 88.0    CBG: Recent Labs  Lab 01/20/18 1134 01/20/18 1654 01/20/18 2121 01/21/18 0712 01/21/18 1206  GLUCAP 204* 120* 146* 127* 147*    Recent Results (from the past 240 hour(s))  Blood culture (routine x 2)     Status: None (Preliminary result)   Collection Time: 02/03/2018 11:42 AM  Result Value Ref Range Status   Specimen Description BLOOD BLOOD RIGHT FOREARM  Final   Special Requests   Final    BOTTLES DRAWN AEROBIC AND ANAEROBIC Blood Culture adequate volume   Culture   Final    NO GROWTH 2 DAYS Performed at [facility], [address] [place], [place] [address]    Report Status PENDING  Incomplete  Blood culture (routine x 2)     Status: None (Preliminary result)   Collection Time: 02/02/2018 12:09 PM  Result Value Ref Range Status   Specimen Description BLOOD BLOOD LEFT HAND  Final   Special Requests   Final    BOTTLES DRAWN AEROBIC AND ANAEROBIC Blood Culture results may not be optimal due to an inadequate volume of blood received in culture bottles   Culture   Final     NO GROWTH 2 DAYS Performed at [facility], [address] [place], [place] [address]    Report Status PENDING  Incomplete  MRSA PCR Screening     Status: None   Collection Time: 01/20/18  6:00 AM  Result Value Ref Range Status   MRSA by PCR NEGATIVE NEGATIVE Final    Comment:        The GeneXpert MRSA Assay (FDA approved for NASAL specimens only), is one component of a comprehensive MRSA colonization surveillance program. It is not intended to diagnose MRSA infection nor to guide or monitor treatment for MRSA infections. Performed at [facility], [address], [place], [place] [address]      Studies: Dg Chest [facility] 1 View  Result Date: 01/21/2018 CLINICAL DATA:  Oxygen desaturation. EXAM: PORTABLE CHEST 1 VIEW COMPARISON:  January 19, 2018 FINDINGS: The cardiomediastinal silhouette is stable. No pneumothorax. Diffuse interstitial opacities are stable. No interval change. IMPRESSION: No significant interval change in cardiomediastinal silhouette or diffuse interstitial opacities. Electronically Signed   By: Dorise Bullion III M.D   On: 01/21/2018 08:16    Scheduled Meds: . albuterol  3 mL Inhalation Q6H  . aspirin EC  81 mg Oral Daily  . budesonide (PULMICORT) nebulizer solution  0.5 mg Nebulization BID  . darifenacin  7.5 mg Oral Daily  . DULoxetine  20 mg Oral Daily  . enoxaparin (LOVENOX) injection  40 mg Subcutaneous Q24H  . feeding supplement (ENSURE ENLIVE)  237 mL Oral BID BM  . feeding supplement (PRO-STAT SUGAR FREE 64)  30 mL Per Tube BID  . feeding supplement (VITAL HIGH PROTEIN)  1,000 mL Per Tube Q24H  . furosemide  40 mg Intravenous Daily  . gabapentin  400 mg Oral QHS  . hydrALAZINE  25 mg Oral TID  . insulin aspart  0-20 Units Subcutaneous TID WC  . insulin aspart  0-5 Units Subcutaneous QHS  . lisinopril  5 mg Oral Daily  . methylPREDNISolone (SOLU-MEDROL) injection  40 mg Intravenous Q6H  . metoprolol succinate  25 mg Oral Daily  .  multivitamin with minerals  1 tablet Oral Daily  . pantoprazole  40 mg Oral BID  . simvastatin  20 mg Oral Daily  . cyanocobalamin  1,000 mcg Oral Daily   Continuous Infusions: . sodium chloride 50 mL/hr at 01/21/18 1400  . ceFEPime (MAXIPIME) IV Stopped (01/21/18 1111)  . famotidine (PEPCID) IV Stopped (01/21/18 1304)    Assessment/Plan:  1. Acute hypoxic respiratory failure.  Change in BiPAP for now sepsis due to pneumonia 2. Clinical sepsis secondary to pneumonia.  Patient on Maxipime and vancomycin.  So far first procalcitonin is negative. 3. COPD exacerbation continue high-dose steroids 4. Acute on  chronic diastolic congestive heart failure with IHSS.  Continue IV Lasix  5. essential hypertension.  Continue metoprolol, lisinopril and hydralazine 6. GERD on Protonix 7. Hyperlipidemia on Zocor 8. Neuropathy on gabapentin  Code Status:     Code Status Orders  (From admission, onward)         Start     Ordered   01/18/2018 1340  Do not attempt resuscitation (DNR)  Continuous    Question Answer Comment  In the event of cardiac or respiratory ARREST Do not call a "code blue"   In the event of cardiac or respiratory ARREST Do not perform Intubation, CPR, defibrillation or ACLS   In the event of cardiac or respiratory ARREST Use medication by any route, position, wound care, and other measures to relive pain and suffering. May use oxygen, suction and manual treatment of airway obstruction as needed for comfort.   Comments Nurse may pronounce      02/07/2018 1339        Code Status History    Date Active Date Inactive Code Status Order ID Comments User Context   11/30/2017 2022 12/04/2017 1610 Full Code [id number]  Fritzi Mandes, MD Inpatient   04/18/2017 2345 04/19/2017 2040 Full Code [id number]  Dustin Flock, MD Inpatient   02/13/2017 1625 02/17/2017 1517 Full Code [id number]  Nicholes Mango, MD Inpatient    Advance Directive Documentation     Most Recent Value  Type of Advance  Directive  Living will  Pre-existing out of facility DNR order (yellow form or pink MOST form)  -  "MOST" Form in Place?  -     Family Communication: Spoke with son on the phone Disposition Plan: Pending Consultants:  Critical care specialist  Antibiotics:  Vancomycin  Cefepime  Time spent: 35 minutes [place]

## 2018-01-21 NOTE — Progress Notes (Signed)
Brief Nutrition Note  Consult received for enteral/tube feeding initiation and management.  Adult Enteral Nutrition Protocol initiated. Full assessment to follow.  Admitting Dx: HCAP (healthcare-associated pneumonia) [J18.9] Acute on chronic respiratory failure with hypoxia (HCC) [J96.21] Sepsis, due to unspecified organism ([place]) [A41.9]  Body mass index is 29.78 kg/m. Pt meets criteria for overweight based on current BMI.  Labs:  Recent Labs  Lab 01/23/2018 1031 01/21/18 0530  NA 140 141  K 3.6 3.9  CL 102 101  CO2 29 31  BUN 17 28*  CREATININE 1.03* 0.99  CALCIUM 9.1 9.6  GLUCOSE 139* 143*    [name] RD, LDN Clinical Nutrition Pager # [phone]

## 2018-01-21 NOTE — Progress Notes (Signed)
Name: [name] MRN: [id number] DOB: 06-Jun-1933     CONSULTATION DATE: 02/06/2018  Subjective & objective: She has to go on a BiPAP because of desaturation on high flow nasal cannula 100%.  PAST MEDICAL HISTORY :   has a past medical history of Anxiety, Arthritis, Cancer ([place]), CHF (congestive heart failure) ([place]), COPD (chronic obstructive pulmonary disease) ([place]), DDD (degenerative disc disease), cervical, Deaf, right, Depression, Diabetes mellitus without complication ([place]), GERD (gastroesophageal reflux disease), High cholesterol, History of hiatal hernia, Hypertension, Neuromuscular disorder ([place]), PONV (postoperative nausea and vomiting), Shortness of breath dyspnea, Vertigo, and Wears dentures.  has a past surgical history that includes Cholecystectomy; Appendectomy; Shoulder surgery; Esophagogastroduodenoscopy (N/A, 07/18/2015); Cataract extraction w/PHACO (Right, 03/10/2016); Joint replacement; Back surgery; Abdominal hysterectomy (1963); and Cataract extraction w/PHACO (Left, 04/07/2016). Prior to Admission medications   Medication Sig Start Date End Date Taking? Authorizing Provider  albuterol (PROVENTIL HFA;VENTOLIN HFA) 108 (90 Base) MCG/ACT inhaler Inhale 2 puffs into the lungs every 6 (six) hours as needed for wheezing or shortness of breath. 11/02/17  Yes Alfred Levins, [place], MD  aspirin EC 81 MG tablet Take 81 mg by mouth daily. breakfast   Yes [provider]  clobetasol cream (TEMOVATE) 2.95 % Apply 1 application as needed topically.   Yes [provider]  cyanocobalamin 1000 MCG tablet Take 1,000 mcg by mouth daily. breakfast   Yes [provider]  DULoxetine (CYMBALTA) 20 MG capsule Take 20 mg daily by mouth.   Yes [provider]  furosemide (LASIX) 20 MG tablet Take 20 mg by mouth daily. May take second dose if needed for swelling.   Yes [provider]  gabapentin (NEURONTIN) 400 MG capsule Take 1 capsule (400 mg total) by  mouth at bedtime. Patient taking differently: Take 800 mg at bedtime by mouth.  03/15/13  Yes Regal, Tamala Fothergill, DPM  hydrALAZINE (APRESOLINE) 25 MG tablet Take 1 tablet (25 mg total) 3 (three) times daily by mouth. 04/19/17  Yes Sudini, Alveta Heimlich, MD  metoprolol succinate (TOPROL-XL) 25 MG 24 hr tablet Take 25 mg daily by mouth.   Yes [provider]  pantoprazole (PROTONIX) 40 MG tablet Take 40 mg 2 (two) times daily by mouth.    Yes [provider]  simvastatin (ZOCOR) 20 MG tablet Take 20 mg by mouth daily.    Yes [provider]  solifenacin (VESICARE) 10 MG tablet Take 10 mg by mouth daily. breakfast   Yes [provider]  traMADol (ULTRAM) 50 MG tablet Take 50 mg by mouth every 6 (six) hours as needed.    Yes [provider]  lisinopril (PRINIVIL,ZESTRIL) 5 MG tablet Take 1 tablet (5 mg total) by mouth daily. 08/08/16 11/30/17  Delman Kitten, MD   Allergies  Allergen Reactions  . Ciprofloxacin Nausea And Vomiting  . Hydrocodone Nausea Only  . Sertraline Nausea And Vomiting  . Codeine Nausea And Vomiting    FAMILY HISTORY:  family history is not on file. SOCIAL HISTORY:  reports that she quit smoking about 17 years ago. Her smoking use included cigarettes. She has a 5.00 pack-year smoking history. She has never used smokeless tobacco. She reports that she does not drink alcohol or use drugs.  REVIEW OF SYSTEMS:   Unable to obtain due to critical illness   VITAL SIGNS: Temp:  [97.6 F (36.4 C)-98.5 F (36.9 C)] 97.6 F (36.4 C) (08/10 0800) Pulse Rate:  [71-99] 99 (08/10 0800) Resp:  [15-41] 34 (08/10 0800) BP: (113-175)/(54-122) 167/72 (  08/10 0800) SpO2:  [82 %-99 %] 95 % (08/10 0800) FiO2 (%):  [94 %-100 %] 100 % (08/10 0800) Weight:  [78.7 kg] 78.7 kg (08/09 1140)  Physical Examination:  Awake and oriented with no focal motor deficits On BiPAP 20/10 100%, no distress, bilateral equal air entry and no adventitious sounds S1 & S2 are  audible with no murmur Benign abdominal exam was normal peristalsis No peripheral edema   ASSESSMENT / PLAN:   Acute hypoxic respiratory failure.  DNR.  Tolerating BiPAP 20/10 100% in no distress. -Continue to monitor work of breathing and O2 sat -Consider palliative care consult as the patient is DNR  Exacerbation of COPD -Bronchodilators + inhaled steroids + systemic steroids  Pneumonia.  Worsening bilateral groundglass opacification and mediastinal adenopathy which was noticed on a previous CAT scan -Cefepime, off vancomycin.  MRSA PCR negative  Prerenal azotemia -Optimize hydration/diuresis, avoid nephrotoxins and monitor renal panel  Chronic diastolic CHF.  Echo in 04/2017 hyperdynamic LV with EF greater than 75%. -Watch for volume overload  Diabetes mellitus -Glycemic control  DNR  DVT & GI prophylaxis.  Continue with supportive care  Critical care time 35 minutes

## 2018-01-21 NOTE — Progress Notes (Signed)
Attempted dobhoff placement this afternoon. The patient refused after two attempts and we could not place the tube.  Cameron Ali, RN

## 2018-01-22 ENCOUNTER — Inpatient Hospital Stay: Payer: Medicare Other

## 2018-01-22 ENCOUNTER — Inpatient Hospital Stay (HOSPITAL_COMMUNITY)
Admit: 2018-01-22 | Discharge: 2018-01-22 | Disposition: A | Discharge status: Home / Self Care | Payer: Medicare Other | Attending: Adult Health | Admitting: Adult Health

## 2018-01-22 DIAGNOSIS — I351 Nonrheumatic aortic (valve) insufficiency: Secondary | ICD-10-CM

## 2018-01-22 LAB — GLUCOSE, CAPILLARY
GLUCOSE-CAPILLARY: 141 mg/dL — AB (ref 70–99)
GLUCOSE-CAPILLARY: 133 mg/dL — AB (ref 70–99)
GLUCOSE-CAPILLARY: 124 mg/dL — AB (ref 70–99)
Glucose-Capillary: 146 mg/dL — ABNORMAL HIGH (ref 70–99)
Glucose-Capillary: 110 mg/dL — ABNORMAL HIGH (ref 70–99)

## 2018-01-22 LAB — CBC WITH DIFFERENTIAL/PLATELET
Basophils Absolute: 0 10*3/uL (ref 0–0.1)
Basophils Relative: 0 %
EOS ABS: 0 10*3/uL (ref 0–0.7)
Eosinophils Relative: 0 %
HEMATOCRIT: 40.4 % (ref 35.0–47.0)
HEMOGLOBIN: 12.6 g/dL (ref 12.0–16.0)
LYMPHS ABS: 0.9 10*3/uL — AB (ref 1.0–3.6)
LYMPHS PCT: 3 %
MCH: 24.7 pg — AB (ref 26.0–34.0)
MCHC: 31.1 g/dL — AB (ref 32.0–36.0)
MCV: 79.3 fL — ABNORMAL LOW (ref 80.0–100.0)
MONOS PCT: 4 %
Monocytes Absolute: 1.3 10*3/uL — ABNORMAL HIGH (ref 0.2–0.9)
NEUTROS ABS: 26.8 10*3/uL — AB (ref 1.4–6.5)
NEUTROS PCT: 93 %
Platelets: 350 10*3/uL (ref 150–440)
RBC: 5.09 MIL/uL (ref 3.80–5.20)
RDW: 17.2 % — ABNORMAL HIGH (ref 11.5–14.5)
WBC: 29 10*3/uL — AB (ref 3.6–11.0)

## 2018-01-22 LAB — BASIC METABOLIC PANEL
ANION GAP: 14 (ref 5–15)
BUN: 36 mg/dL — AB (ref 8–23)
CO2: 32 mmol/L (ref 22–32)
Calcium: 9.7 mg/dL (ref 8.9–10.3)
Chloride: 99 mmol/L (ref 98–111)
Creatinine, Ser: 1.11 mg/dL — ABNORMAL HIGH (ref 0.44–1.00)
GFR calc Af Amer: 52 mL/min — ABNORMAL LOW (ref >60)
GFR, EST NON AFRICAN AMERICAN: 45 mL/min — AB (ref >60)
GLUCOSE: 141 mg/dL — AB (ref 70–99)
POTASSIUM: 3.7 mmol/L (ref 3.5–5.1)
Sodium: 145 mmol/L (ref 135–145)

## 2018-01-22 LAB — PROCALCITONIN: PROCALCITONIN: 0.14 ng/mL

## 2018-01-22 LAB — PHOSPHORUS: Phosphorus: 3.1 mg/dL (ref 2.5–4.6)

## 2018-01-22 LAB — ECHOCARDIOGRAM COMPLETE
Height: 64 in
WEIGHTICAEL: 2740.76 [oz_av]

## 2018-01-22 LAB — MAGNESIUM: Magnesium: 2.4 mg/dL (ref 1.7–2.4)

## 2018-01-22 LAB — BRAIN NATRIURETIC PEPTIDE: B NATRIURETIC PEPTIDE 5: 131 pg/mL — AB (ref 0.0–100.0)

## 2018-01-22 MED ORDER — INSULIN ASPART 100 UNIT/ML ~~LOC~~ SOLN
0.0000 [IU] | SUBCUTANEOUS | Status: DC
  Administered 2018-01-22 – 2018-01-23 (×5): 3 [IU] via SUBCUTANEOUS
  Administered 2018-01-23: 4 [IU] via SUBCUTANEOUS
  Administered 2018-01-23 – 2018-01-24 (×3): 3 [IU] via SUBCUTANEOUS
  Filled 2018-01-22 (×8): qty 1

## 2018-01-22 MED ORDER — DIPHENHYDRAMINE HCL 50 MG/ML IJ SOLN
50.0000 mg | Freq: Once | INTRAMUSCULAR | Status: AC
  Administered 2018-01-22: 50 mg via INTRAVENOUS
  Filled 2018-01-22: qty 1

## 2018-01-22 MED ORDER — ZIPRASIDONE MESYLATE 20 MG IM SOLR
10.0000 mg | Freq: Once | INTRAMUSCULAR | Status: AC
  Administered 2018-01-22: 10 mg via INTRAMUSCULAR
  Filled 2018-01-22: qty 20

## 2018-01-22 MED ORDER — DIPHENHYDRAMINE HCL 50 MG/ML IJ SOLN: 50.0000 mg | Freq: Once | INTRAMUSCULAR | Status: AC

## 2018-01-22 MED ORDER — ORAL CARE MOUTH RINSE
15.0000 mL | Freq: Two times a day (BID) | OROMUCOSAL | Status: DC
  Administered 2018-01-22 – 2018-01-26 (×7): 15 mL via OROMUCOSAL

## 2018-01-22 MED ORDER — METHYLPREDNISOLONE SODIUM SUCC 40 MG IJ SOLR
40.0000 mg | Freq: Two times a day (BID) | INTRAMUSCULAR | Status: AC
  Administered 2018-01-22 – 2018-01-24 (×5): 40 mg via INTRAVENOUS
  Filled 2018-01-22 (×5): qty 1

## 2018-01-22 MED ORDER — CHLORHEXIDINE GLUCONATE 0.12 % MT SOLN
15.0000 mL | Freq: Two times a day (BID) | OROMUCOSAL | Status: DC
  Administered 2018-01-23 – 2018-01-28 (×8): 15 mL via OROMUCOSAL
  Filled 2018-01-22 (×9): qty 15

## 2018-01-22 MED ORDER — FUROSEMIDE 10 MG/ML IJ SOLN
20.0000 mg | Freq: Every day | INTRAMUSCULAR | Status: DC
Start: 2018-01-23 — End: 2018-01-23
  Administered 2018-01-23: 20 mg via INTRAVENOUS
  Filled 2018-01-22: qty 2

## 2018-01-22 MED ORDER — METOCLOPRAMIDE HCL 5 MG/ML IJ SOLN
5.0000 mg | Freq: Four times a day (QID) | INTRAMUSCULAR | Status: DC | PRN
  Administered 2018-01-22 – 2018-01-23 (×3): 5 mg via INTRAVENOUS
  Filled 2018-01-22 (×3): qty 2

## 2018-01-22 NOTE — Progress Notes (Addendum)
Patient placed back on Bipap from HFNC. Patient tolerated HFNC for approximately 1 hour before desat to low 80s and became tachypneic. Will continue to monitor

## 2018-01-22 NOTE — Progress Notes (Addendum)
Nutrition Follow-up  DOCUMENTATION CODES:   Not applicable  INTERVENTION:  Patient refused placement of Dobbhoff tube for enteral nutrition.  As patient does not meet criteria for malnutrition, can hold off on initiation of any nutrition support at this time. Patient was eating 90-100% of meals up until being made NPO on AM of 8/10. Will need to consider initiation of nutrition support if diet unable to be advanced by 8/17-8/20.   Once diet able to be advanced recommend Ensure Enlive po BID, each supplement provides 350 kcal and 20 grams of protein. Patient prefers strawberry.  NUTRITION DIAGNOSIS:   Increased nutrient needs related to chronic illness(COPD, pulmonary fibrosis) as evidenced by estimated needs.  Ongoing.  GOAL:   Patient will meet greater than or equal to 90% of their needs  Unable to progress at this time as patient is NPO and refused placement of NGT.  MONITOR:   PO intake, Supplement acceptance, Labs, Weight trends, Skin, I & O's  REASON FOR ASSESSMENT:   Consult Assessment of nutrition requirement/status, Enteral/tube feeding initiation and management  ASSESSMENT:   84 y.o. female with a history of pulmonary fibrosis on 3 L nasal cannula, CHF with preserved EF, hypertension, hyperlipidemia, diabetes who presents for evaluation of shortness of breath.   Patient ate 90% of her dinner on 8/8 and 100% of breakfast on 8/9. On 8/9 she was approved for regular texture diet with thin liquids by SLP. She was made NPO on AM of 8/10 as patient required BiPAP after desaturation on HFNC 100%.  RD was consulted for enteral tube feeding initiation/management. However, patient refused placement of Dobbhoff tube x2. Patient was on HFNC this AM for a while but had to be placed back on BiPAP after desaturation and tachypnea.  Medications reviewed and include: Lasix 40 mg daily IV, gabapentin, Novolog 0-20 units Q4hrs, Novolog 0-5 units QHS, Solu-Medrol 40 mg Q6hrs IV, MVI  daily, vitamin B12 1000 mcg daily, cefepime, famotidine.  Labs reviewed: CBG 133-169, BUN 36, Creatinine 1.11, BNP 131.  Discussed with RN.  NUTRITION - FOCUSED PHYSICAL EXAM Completed on 01/20/2018:    Most Recent Value  Orbital Region  No depletion  Upper Arm Region  No depletion  Thoracic and Lumbar Region  No depletion  Buccal Region  No depletion  Temple Region  No depletion  Clavicle Bone Region  No depletion  Clavicle and Acromion Bone Region  No depletion  Scapular Bone Region  No depletion  Dorsal Hand  No depletion  Patellar Region  No depletion  Anterior Thigh Region  No depletion  Posterior Calf Region  No depletion  Edema (RD Assessment)  Mild  Hair  Reviewed  Eyes  Reviewed  Mouth  Reviewed  Skin  Reviewed  Nails  Reviewed     Diet Order:   Diet Order            Diet 2 gram sodium Room service appropriate? Yes; Fluid consistency: Thin  Diet effective now              EDUCATION NEEDS:   Education needs have been addressed  Skin:  Skin Assessment: Reviewed RN Assessment(ecchymosis )  Last BM:  8/7  Height:   Ht Readings from Last 1 Encounters:  01/20/18 5\' 4"  (1.626 m)    Weight:   Wt Readings from Last 1 Encounters:  01/23/18 75.7 kg    Ideal Body Weight:  54.5 kg  BMI:  Body mass index is 28.65 kg/m.  Estimated Nutritional Needs:  Kcal:  1500-1700kcal/day   Protein:  77-85g/day   Fluid:  >1.5L/day or per MD  Willey Blade, MS, RD, LDN Office: [phone] Pager: [phone] After Hours/Weekend Pager: [phone]

## 2018-01-22 NOTE — Progress Notes (Signed)
Name: [name] MRN: [id number] DOB: [birth year]     CONSULTATION DATE: 01/28/2018  Subjective & Objectives: She doesn't tolerate off BiPAP. Restless and refused DHT.  PAST MEDICAL HISTORY :   has a past medical history of Anxiety, Arthritis, Cancer ([place]), CHF (congestive heart failure) ([place]), COPD (chronic obstructive pulmonary disease) ([place]), DDD (degenerative disc disease), cervical, Deaf, right, Depression, Diabetes mellitus without complication ([place]), GERD (gastroesophageal reflux disease), High cholesterol, History of hiatal hernia, Hypertension, Neuromuscular disorder ([place]), PONV (postoperative nausea and vomiting), Shortness of breath dyspnea, Vertigo, and Wears dentures.  has a past surgical history that includes Cholecystectomy; Appendectomy; Shoulder surgery; Esophagogastroduodenoscopy (N/A, 07/18/2015); Cataract extraction w/PHACO (Right, 03/10/2016); Joint replacement; Back surgery; Abdominal hysterectomy (1963); and Cataract extraction w/PHACO (Left, 04/07/2016). Prior to Admission medications   Medication Sig Start Date End Date Taking? Authorizing Provider  albuterol (PROVENTIL HFA;VENTOLIN HFA) 108 (90 Base) MCG/ACT inhaler Inhale 2 puffs into the lungs every 6 (six) hours as needed for wheezing or shortness of breath. 11/02/17  Yes Alfred Levins, [place], MD  aspirin EC 81 MG tablet Take 81 mg by mouth daily. breakfast   Yes [provider]  clobetasol cream (TEMOVATE) 4.62 % Apply 1 application as needed topically.   Yes [provider]  cyanocobalamin 1000 MCG tablet Take 1,000 mcg by mouth daily. breakfast   Yes [provider]  DULoxetine (CYMBALTA) 20 MG capsule Take 20 mg daily by mouth.   Yes [provider]  furosemide (LASIX) 20 MG tablet Take 20 mg by mouth daily. May take second dose if needed for swelling.   Yes [provider]  gabapentin (NEURONTIN) 400 MG capsule Take 1 capsule (400 mg total) by mouth at bedtime. Patient  taking differently: Take 800 mg at bedtime by mouth.  03/15/13  Yes Regal, Tamala Fothergill, DPM  hydrALAZINE (APRESOLINE) 25 MG tablet Take 1 tablet (25 mg total) 3 (three) times daily by mouth. 04/19/17  Yes Sudini, Alveta Heimlich, MD  metoprolol succinate (TOPROL-XL) 25 MG 24 hr tablet Take 25 mg daily by mouth.   Yes [provider]  pantoprazole (PROTONIX) 40 MG tablet Take 40 mg 2 (two) times daily by mouth.    Yes [provider]  simvastatin (ZOCOR) 20 MG tablet Take 20 mg by mouth daily.    Yes [provider]  solifenacin (VESICARE) 10 MG tablet Take 10 mg by mouth daily. breakfast   Yes [provider]  traMADol (ULTRAM) 50 MG tablet Take 50 mg by mouth every 6 (six) hours as needed.    Yes [provider]  lisinopril (PRINIVIL,ZESTRIL) 5 MG tablet Take 1 tablet (5 mg total) by mouth daily. 08/08/16 11/30/17  Delman Kitten, MD   Allergies  Allergen Reactions  . Ciprofloxacin Nausea And Vomiting  . Hydrocodone Nausea Only  . Sertraline Nausea And Vomiting  . Codeine Nausea And Vomiting    FAMILY HISTORY:  family history is not on file. SOCIAL HISTORY:  reports that she quit smoking about 17 years ago. Her smoking use included cigarettes. She has a 5.00 pack-year smoking history. She has never used smokeless tobacco. She reports that she does not drink alcohol or use drugs.  REVIEW OF SYSTEMS:   Unable to obtain due to critical illness   VITAL SIGNS: Temp:  [97.5 F (36.4 C)-98.9 F (37.2 C)] 98.9 F (37.2 C) (08/11 1200) Pulse Rate:  [66-126] 100 (08/11 1200) Resp:  [18-42] 24 (08/11 1200) BP: (120-183)/(56-129) 163/64 (08/11 1200) SpO2:  [89 %-98 %]  97 % (08/11 1200) FiO2 (%):  [60 %-100 %] 66.5 % (08/11 0832) Weight:  [77.7 kg-77.8 kg] 77.7 kg (08/11 0500)  Physical Examination:  Awake and oriented with no focal motor deficits On BiPAP 60%, no distress, bilateral equal air entry and no adventitious sounds S1 & S2 are audible with no  murmur Benign abdominal exam was normal peristalsis No peripheral edema   ASSESSMENT / PLAN:  Acute hypoxic respiratory failure.  DNR. BiPAP dependent -Continue to monitor work of breathing and O2 sat -Follow with palliative care consult as the patient is DNR  Altered mental status (restlessness) with toxic metabolic encephalopathy and steroid induced psychosis -Optimize meds and monitor neuro status  Exacerbation of COPD -Bronchodilators + inhaled steroids + taper systemic steroids  Pneumonia.  improved bilateral groundglass opacification and mediastinal adenopathy which was noticed on a previous CAT scan -Cefepime, off vancomycin.  MRSA PCR negative  AKI and prerenal azotemia with diuresis, negative catabolic effect of steroids and intravascular volume depletion -Optimize hydration/diuresis, avoid nephrotoxins and monitor renal panel  HTN -Optimize antihypertensives and monitor hemodynamics.  Chronic diastolic CHF.  Echo in 04/2017 hyperdynamic LV with EF greater than 75%. -Watch for volume overload  Diabetes mellitus -Glycemic control  DNR and she refused DHT.  DVT & GI prophylaxis.  Continue with supportive care  Critical care time 35 minutes

## 2018-01-22 NOTE — Progress Notes (Signed)
Patient ID: [name], female   DOB: 02-03-1933, 84 y.o.   MRN: [id number]  [facility] PROGRESS NOTE  ARAYNA ILLESCAS VEL:[id number] DOB: 04-05-1933 DOA: 01/16/2018 PCP: Kirk Ruths, MD  HPI/Subjective: She was on BiPAP and was taken off this morning and oxygen sats dropping.  She states that she is feeling better  Objective: Vitals:   01/22/18 1459 01/22/18 1500  BP: (!) 176/76 (!) 180/84  Pulse:  (!) 108  Resp:  (!) 26  Temp:    SpO2:  95%    Filed Weights   01/20/18 1140 01/21/18 1534 01/22/18 0500  Weight: 78.7 kg 77.8 kg 77.7 kg    ROS: Review of Systems  Constitutional: Negative for chills and fever.  Eyes: Negative for blurred vision.  Respiratory: Positive for cough and shortness of breath.   Cardiovascular: Negative for chest pain.  Gastrointestinal: Negative for abdominal pain, constipation, diarrhea, nausea and vomiting.  Genitourinary: Negative for dysuria.  Musculoskeletal: Negative for joint pain.  Neurological: Negative for dizziness and headaches.   Exam: Physical Exam  Constitutional: She is oriented to person, place, and time.  HENT:  Nose: No mucosal edema.  Mouth/Throat: No oropharyngeal exudate or posterior oropharyngeal edema.  Eyes: Pupils are equal, round, and reactive to light. Conjunctivae, EOM and lids are normal.  Neck: No JVD present. Carotid bruit is not present. No edema present. No thyroid mass and no thyromegaly present.  Cardiovascular: S1 normal and S2 normal. Exam reveals no gallop.  No murmur heard. Pulses:      Dorsalis pedis pulses are 2+ on the right side, and 2+ on the left side.  Respiratory: Accessory muscle usage present. No respiratory distress. She has decreased breath sounds in the right middle field, the right lower field, the left middle field and the left lower field. She has no wheezes. She has rhonchi in the right middle field and the left middle field. She has rales in the right lower field and the  left lower field.  GI: Soft. Bowel sounds are normal. There is no tenderness.  Musculoskeletal:       Right ankle: She exhibits no swelling.       Left ankle: She exhibits no swelling.  Lymphadenopathy:    She has no cervical adenopathy.  Neurological: She is alert and oriented to person, place, and time. No cranial nerve deficit.  Skin: Skin is warm. No rash noted. Nails show no clubbing.  Psychiatric: She has a normal mood and affect.      Data Reviewed: Basic Metabolic Panel: Recent Labs  Lab 01/26/2018 1031 01/21/18 0530 01/22/18 0606  NA 140 141 145  K 3.6 3.9 3.7  CL 102 101 99  CO2 29 31 32  GLUCOSE 139* 143* 141*  BUN 17 28* 36*  CREATININE 1.03* 0.99 1.11*  CALCIUM 9.1 9.6 9.7  MG  --   --  2.4  PHOS  --   --  3.1   CBC: Recent Labs  Lab 02/06/2018 1031 01/21/18 0530 01/22/18 0606  WBC 13.4* 37.0* 29.0*  NEUTROABS  --   --  26.8*  HGB 11.7* 12.1 12.6  HCT 36.3 38.7 40.4  MCV 79.7* 79.7* 79.3*  PLT 282 334 350   Cardiac Enzymes: Recent Labs  Lab 02/07/2018 1031  TROPONINI <0.03   BNP (last 3 results) Recent Labs    11/30/17 1628 01/16/2018 1036 01/22/18 0606  BNP 252.0* 88.0 131.0*    CBG: Recent Labs  Lab 01/21/18 2154 01/21/18 2353  01/22/18 0407 01/22/18 0723 01/22/18 1156  GLUCAP 143* 149* 141* 133* 124*    Recent Results (from the past 240 hour(s))  Blood culture (routine x 2)     Status: None (Preliminary result)   Collection Time: 01/28/2018 11:42 AM  Result Value Ref Range Status   Specimen Description BLOOD BLOOD RIGHT FOREARM  Final   Special Requests   Final    BOTTLES DRAWN AEROBIC AND ANAEROBIC Blood Culture adequate volume   Culture   Final    NO GROWTH 3 DAYS Performed at [facility], [address] [place], [place] [address]    Report Status PENDING  Incomplete  Blood culture (routine x 2)     Status: None (Preliminary result)   Collection Time: 01/24/2018 12:09 PM  Result Value Ref Range Status   Specimen  Description BLOOD BLOOD LEFT HAND  Final   Special Requests   Final    BOTTLES DRAWN AEROBIC AND ANAEROBIC Blood Culture results may not be optimal due to an inadequate volume of blood received in culture bottles   Culture   Final    NO GROWTH 3 DAYS Performed at [facility], [address] [place], [place] [address]    Report Status PENDING  Incomplete  MRSA PCR Screening     Status: None   Collection Time: 01/20/18  6:00 AM  Result Value Ref Range Status   MRSA by PCR NEGATIVE NEGATIVE Final    Comment:        The GeneXpert MRSA Assay (FDA approved for NASAL specimens only), is one component of a comprehensive MRSA colonization surveillance program. It is not intended to diagnose MRSA infection nor to guide or monitor treatment for MRSA infections. Performed at [facility], [address] [place], [place] [address]      Studies: Dg Chest [facility] 1 View  Result Date: 01/22/2018 CLINICAL DATA:  CHF EXAM: PORTABLE CHEST 1 VIEW COMPARISON:  Multiple priors, including chest radiograph dated 01/21/2018 and CT chest dated 01/28/2018. FINDINGS: Multifocal patchy/interstitial opacities, right lung greater than left, although left lower lobe predominant. The patient has known underlying chronic shin lung disease with subpleural reticulation/fibrosis. Superimposed pneumonia is suspected, particularly in the right upper lobe. Interstitial edema is considered less likely given the pattern. No definite pleural effusions. The heart is normal in size. Cervical spine fixation hardware. IMPRESSION: Chronic interstitial lung disease with suspected superimposed patchy opacities/pneumonia in the right upper lobe. Electronically Signed   By: Julian Hy M.D.   On: 01/22/2018 08:16   Dg Chest Port 1 View  Result Date: 01/21/2018 CLINICAL DATA:  Oxygen desaturation. EXAM: PORTABLE CHEST 1 VIEW COMPARISON:  January 19, 2018 FINDINGS: The cardiomediastinal silhouette is stable. No  pneumothorax. Diffuse interstitial opacities are stable. No interval change. IMPRESSION: No significant interval change in cardiomediastinal silhouette or diffuse interstitial opacities. Electronically Signed   By: Dorise Bullion III M.D   On: 01/21/2018 08:16    Scheduled Meds: . albuterol  3 mL Inhalation Q6H  . aspirin EC  81 mg Oral Daily  . budesonide (PULMICORT) nebulizer solution  0.5 mg Nebulization BID  . chlorhexidine  15 mL Mouth Rinse BID  . darifenacin  7.5 mg Oral Daily  . DULoxetine  20 mg Oral Daily  . enoxaparin (LOVENOX) injection  40 mg Subcutaneous Q24H  . [START ON 01/23/2018] furosemide  20 mg Intravenous Daily  . gabapentin  400 mg Oral QHS  . insulin aspart  0-20 Units Subcutaneous Q4H  .  insulin aspart  0-5 Units Subcutaneous QHS  . lisinopril  5 mg Oral Daily  . mouth rinse  15 mL Mouth Rinse q12n4p  . methylPREDNISolone (SOLU-MEDROL) injection  40 mg Intravenous Q12H  . metoprolol tartrate  25 mg Per Tube BID  . multivitamin with minerals  1 tablet Oral Daily  . simvastatin  20 mg Oral Daily  . cyanocobalamin  1,000 mcg Oral Daily   Continuous Infusions: . ceFEPime (MAXIPIME) IV Stopped (01/22/18 1121)  . famotidine (PEPCID) IV Stopped (01/22/18 1246)    Assessment/Plan:  1. Acute hypoxic respiratory failure.  Continue high flow oxygen BiPAP as needed 2. Clinical sepsis secondary to pneumonia.  Patient on Maxipime 3. COPD exacerbation continue high-dose steroids nebulizer therapy 4. Acute on chronic diastolic congestive heart failure with IHSS.  Continue IV Lasix to renal function 5. essential hypertension.  Continue metoprolol, lisinopril and hydralazine 6. GERD on Protonix 7. Hyperlipidemia on Zocor 8. Neuropathy on gabapentin  Code Status:     Code Status Orders  (From admission, onward)         Start     Ordered   01/28/2018 1340  Do not attempt resuscitation (DNR)  Continuous    Question Answer Comment  In the event of cardiac or  respiratory ARREST Do not call a "code blue"   In the event of cardiac or respiratory ARREST Do not perform Intubation, CPR, defibrillation or ACLS   In the event of cardiac or respiratory ARREST Use medication by any route, position, wound care, and other measures to relive pain and suffering. May use oxygen, suction and manual treatment of airway obstruction as needed for comfort.   Comments Nurse may pronounce      01/15/2018 1339        Code Status History    Date Active Date Inactive Code Status Order ID Comments User Context   11/30/2017 2022 12/04/2017 1610 Full Code [id number]  Fritzi Mandes, MD Inpatient   04/18/2017 2345 04/19/2017 2040 Full Code [id number]  Dustin Flock, MD Inpatient   02/13/2017 1625 02/17/2017 1517 Full Code [id number]  Nicholes Mango, MD Inpatient    Advance Directive Documentation     Most Recent Value  Type of Advance Directive  Living will  Pre-existing out of facility DNR order (yellow form or pink MOST form)  -  "MOST" Form in Place?  -     Family Communication: Spoke with son on the phone Disposition Plan: Pending Consultants:  Critical care specialist  Antibiotics:  Vancomycin  Cefepime  Time spent: 35 minutes [place]

## 2018-01-22 NOTE — Progress Notes (Signed)
Pt has become tachypneic and sat's dropped with HFNC. Pt re-placed on bi-pap; sat's have improved. Will continue to monitor.

## 2018-01-23 ENCOUNTER — Inpatient Hospital Stay: Payer: Medicare Other

## 2018-01-23 DIAGNOSIS — Z7189 Other specified counseling: Secondary | ICD-10-CM

## 2018-01-23 DIAGNOSIS — Z515 Encounter for palliative care: Secondary | ICD-10-CM

## 2018-01-23 DIAGNOSIS — J8410 Pulmonary fibrosis, unspecified: Secondary | ICD-10-CM

## 2018-01-23 LAB — GLUCOSE, CAPILLARY
Glucose-Capillary: 119 mg/dL — ABNORMAL HIGH (ref 70–99)
Glucose-Capillary: 129 mg/dL — ABNORMAL HIGH (ref 70–99)
Glucose-Capillary: 130 mg/dL — ABNORMAL HIGH (ref 70–99)
Glucose-Capillary: 138 mg/dL — ABNORMAL HIGH (ref 70–99)
Glucose-Capillary: 176 mg/dL — ABNORMAL HIGH (ref 70–99)
Glucose-Capillary: 87 mg/dL (ref 70–99)

## 2018-01-23 LAB — CBC WITH DIFFERENTIAL/PLATELET
Basophils Absolute: 0 10*3/uL (ref 0–0.1)
Basophils Relative: 0 %
EOS ABS: 0 10*3/uL (ref 0–0.7)
EOS PCT: 0 %
HCT: 38.4 % (ref 35.0–47.0)
Hemoglobin: 12.3 g/dL (ref 12.0–16.0)
LYMPHS ABS: 0.6 10*3/uL — AB (ref 1.0–3.6)
LYMPHS PCT: 3 %
MCH: 25.6 pg — ABNORMAL LOW (ref 26.0–34.0)
MCHC: 32.1 g/dL (ref 32.0–36.0)
MCV: 79.7 fL — ABNORMAL LOW (ref 80.0–100.0)
MONO ABS: 0.8 10*3/uL (ref 0.2–0.9)
MONOS PCT: 4 %
Neutro Abs: 17.4 10*3/uL — ABNORMAL HIGH (ref 1.4–6.5)
Neutrophils Relative %: 93 %
PLATELETS: 325 10*3/uL (ref 150–440)
RBC: 4.82 MIL/uL (ref 3.80–5.20)
RDW: 17.5 % — ABNORMAL HIGH (ref 11.5–14.5)
WBC: 18.8 10*3/uL — ABNORMAL HIGH (ref 3.6–11.0)

## 2018-01-23 LAB — COMPREHENSIVE METABOLIC PANEL
ALBUMIN: 3.4 g/dL — AB (ref 3.5–5.0)
ALK PHOS: 78 U/L (ref 38–126)
ALT: 11 U/L (ref 0–44)
AST: 14 U/L — ABNORMAL LOW (ref 15–41)
Anion gap: 11 (ref 5–15)
BILIRUBIN TOTAL: 1.1 mg/dL (ref 0.3–1.2)
BUN: 48 mg/dL — AB (ref 8–23)
CALCIUM: 9.5 mg/dL (ref 8.9–10.3)
CO2: 31 mmol/L (ref 22–32)
CREATININE: 1.09 mg/dL — AB (ref 0.44–1.00)
Chloride: 104 mmol/L (ref 98–111)
GFR calc Af Amer: 53 mL/min — ABNORMAL LOW (ref >60)
GFR calc non Af Amer: 46 mL/min — ABNORMAL LOW (ref >60)
GLUCOSE: 148 mg/dL — AB (ref 70–99)
Potassium: 3.2 mmol/L — ABNORMAL LOW (ref 3.5–5.1)
SODIUM: 146 mmol/L — AB (ref 135–145)
TOTAL PROTEIN: 6.8 g/dL (ref 6.5–8.1)

## 2018-01-23 LAB — PHOSPHORUS: Phosphorus: 2.8 mg/dL (ref 2.5–4.6)

## 2018-01-23 LAB — MAGNESIUM: Magnesium: 2.5 mg/dL — ABNORMAL HIGH (ref 1.7–2.4)

## 2018-01-23 MED ORDER — POTASSIUM CHLORIDE 10 MEQ/100ML IV SOLN
10.0000 meq | INTRAVENOUS | Status: DC
  Administered 2018-01-23: 10 meq via INTRAVENOUS
  Filled 2018-01-23 (×4): qty 100

## 2018-01-23 MED ORDER — POTASSIUM CHLORIDE CRYS ER 20 MEQ PO TBCR
30.0000 meq | EXTENDED_RELEASE_TABLET | Freq: Once | ORAL | Status: AC
  Administered 2018-01-23: 30 meq via ORAL
  Filled 2018-01-23: qty 2

## 2018-01-23 MED ORDER — DOXYCYCLINE HYCLATE 100 MG PO TABS
100.0000 mg | ORAL_TABLET | Freq: Two times a day (BID) | ORAL | Status: DC
  Administered 2018-01-23 – 2018-01-25 (×6): 100 mg via ORAL
  Filled 2018-01-23 (×6): qty 1

## 2018-01-23 NOTE — Care Management (Signed)
RNCM to follow. Per MD patient is not appropriate for LTAC however she may need choice of hospice agencies.  Palliative consult pending. LTAC declined at this time per MD.

## 2018-01-23 NOTE — Progress Notes (Signed)
Name: [name] MRN: [id number] DOB: Mar 25, 1933     CONSULTATION DATE: 02/01/2018  Subjective & Objectives: Denies shortness of breath or pain Weaned to 80% HFNC from Bipap for approx. 1 hr Wants to eat   PAST MEDICAL HISTORY :   has a past medical history of Anxiety, Arthritis, Cancer ([place]), CHF (congestive heart failure) ([place]), COPD (chronic obstructive pulmonary disease) ([place]), DDD (degenerative disc disease), cervical, Deaf, right, Depression, Diabetes mellitus without complication ([place]), GERD (gastroesophageal reflux disease), High cholesterol, History of hiatal hernia, Hypertension, Neuromuscular disorder ([place]), PONV (postoperative nausea and vomiting), Shortness of breath dyspnea, Vertigo, and Wears dentures.  has a past surgical history that includes Cholecystectomy; Appendectomy; Shoulder surgery; Esophagogastroduodenoscopy (N/A, 07/18/2015); Cataract extraction w/PHACO (Right, 03/10/2016); Joint replacement; Back surgery; Abdominal hysterectomy (1963); and Cataract extraction w/PHACO (Left, 04/07/2016). Prior to Admission medications   Medication Sig Start Date End Date Taking? Authorizing Provider  albuterol (PROVENTIL HFA;VENTOLIN HFA) 108 (90 Base) MCG/ACT inhaler Inhale 2 puffs into the lungs every 6 (six) hours as needed for wheezing or shortness of breath. 11/02/17  Yes Alfred Levins, [place], MD  aspirin EC 81 MG tablet Take 81 mg by mouth daily. breakfast   Yes [provider]  clobetasol cream (TEMOVATE) 8.46 % Apply 1 application as needed topically.   Yes [provider]  cyanocobalamin 1000 MCG tablet Take 1,000 mcg by mouth daily. breakfast   Yes [provider]  DULoxetine (CYMBALTA) 20 MG capsule Take 20 mg daily by mouth.   Yes [provider]  furosemide (LASIX) 20 MG tablet Take 20 mg by mouth daily. May take second dose if needed for swelling.   Yes [provider]  gabapentin (NEURONTIN) 400 MG capsule Take 1 capsule  (400 mg total) by mouth at bedtime. Patient taking differently: Take 800 mg at bedtime by mouth.  03/15/13  Yes Regal, Tamala Fothergill, DPM  hydrALAZINE (APRESOLINE) 25 MG tablet Take 1 tablet (25 mg total) 3 (three) times daily by mouth. 04/19/17  Yes Sudini, Alveta Heimlich, MD  metoprolol succinate (TOPROL-XL) 25 MG 24 hr tablet Take 25 mg daily by mouth.   Yes [provider]  pantoprazole (PROTONIX) 40 MG tablet Take 40 mg 2 (two) times daily by mouth.    Yes [provider]  simvastatin (ZOCOR) 20 MG tablet Take 20 mg by mouth daily.    Yes [provider]  solifenacin (VESICARE) 10 MG tablet Take 10 mg by mouth daily. breakfast   Yes [provider]  traMADol (ULTRAM) 50 MG tablet Take 50 mg by mouth every 6 (six) hours as needed.    Yes [provider]  lisinopril (PRINIVIL,ZESTRIL) 5 MG tablet Take 1 tablet (5 mg total) by mouth daily. 08/08/16 11/30/17  Delman Kitten, MD   Allergies  Allergen Reactions  . Ciprofloxacin Nausea And Vomiting  . Hydrocodone Nausea Only  . Sertraline Nausea And Vomiting  . Codeine Nausea And Vomiting    FAMILY HISTORY:  family history is not on file. SOCIAL HISTORY:  reports that she quit smoking about 17 years ago. Her smoking use included cigarettes. She has a 5.00 pack-year smoking history. She has never used smokeless tobacco. She reports that she does not drink alcohol or use drugs.  REVIEW OF SYSTEMS:   Positives in BOLD: Gen: Denies fever, chills, weight change, fatigue, night sweats HEENT: Denies blurred vision, double vision, hearing loss, tinnitus, sinus congestion, rhinorrhea, sore throat, neck stiffness, dysphagia PULM: Denies shortness of breath, +cough, sputum production, hemoptysis,  wheezing CV: Denies chest pain, edema, orthopnea, paroxysmal nocturnal dyspnea, palpitations GI: Denies abdominal pain, nausea, vomiting, diarrhea, hematochezia, melena, constipation, change in bowel habits GU: Denies dysuria,  hematuria, polyuria, oliguria, urethral discharge Endocrine: Denies hot or cold intolerance, polyuria, polyphagia or appetite change Derm: Denies rash, dry skin, scaling or peeling skin change Heme: Denies easy bruising, bleeding, bleeding gums Neuro: Denies headache, numbness, weakness, slurred speech, loss of memory or consciousness    VITAL SIGNS: Temp:  [97.6 F (36.4 C)-98.3 F (36.8 C)] 97.9 F (36.6 C) (08/12 0800) Pulse Rate:  [91-122] 106 (08/12 0800) Resp:  [19-33] 24 (08/12 0800) BP: (113-204)/(52-131) 160/63 (08/12 0800) SpO2:  [93 %-99 %] 96 % (08/12 0800) FiO2 (%):  [50 %-75 %] 75 % (08/12 1130) Weight:  [75.7 kg] 75.7 kg (08/12 0754)  Physical Examination:  General:Acute on chronically ill appearing female, laying in bed, on 80% HFNC, in no acute distress Neuro: A&O x4, awake, follows commands, no focal deficits HEENT: Atraumatic, normocephalic, neck supple, no JVD Cardiac: RRR, s1s2, no M/R/G, palpable pulses throughout Pulmonary: Clear bilaterally w/ fine crackles bilaterally, even, non-labored GI: soft, non-distended, non-tender, BS+ x4 Musculoskeletal: No deformities, active ROM all extremities Skin: No obvious rashes, lesions, or ulcerations   ASSESSMENT / PLAN:  PULMONARY A: Acute hypoxic respiratory failure in setting of IPF exacerbation, AECOPD, PNA   -CT Chest>>  improved bilateral groundglass opacification and mediastinal adenopathy which was noticed on a previous CAT scan Hx: Pulmonary Fibrosis P: -Supplemental O2 to maintain O2 sats 88 to 92% -HFNC and BiPAP as needed -Continue Bronchodilators & ICS -Continue Solu-medrol IV 40mg  BID -Discussed with Dr. Alva Garnet 01/23/18>>will d/c Cefepime and start PO Doxycycline for 5 days  -Follow intermittent CXR -Palliative care consulted, appreciate input  CARDIAC A: CHF Exacerbation HTN -Echo>> EF 65-70%, moderate LV hypertrophy, grade 2 diastolic dysfunction.  RV systolic function normal.  Pulmonary  arteries systolic pressures normal. P: ICU monitoring Discussed with Dr. Alva Garnet 8/12>> will d/c Lasix Continue Lisinopril, metoprolol  RENAL A:  AKI in setting of diuresis Hypokalemia Mild hypernatremia P: Monitor I&O's / urinary output Follow BMP Ensure adequate renal perfusion Avoid nephrotoxic agents as able Replace electrolytes as indicated Replete K 8/12, recheck BMP in am 8/13  ENDOCRINE A: Diabetes Mellitus P: Monitor CBG's SSI Follow ICU hypo/hyperglycemia protocol  NEURO A: AMS & Metabolic Encephalopathy>>resolved P: Provide supportive care Avoid sedating meds as able. Lights on during the day Provide normal wake/sleep cycle    GOALS OF CARE: DNR/DNI. Confirmed by pt and family 01/23/18. Family: Family updated at bedside 01/23/18 VTE prophylaxis: Lovenox SQ SUP: Pepcid    Darel Hong, AGACNP-BC [facility] Pager: [phone]

## 2018-01-23 NOTE — Progress Notes (Signed)
Patient ID: [name], female   DOB: 09/09/1932, 84 y.o.   MRN: [id number]  [place] PROGRESS NOTE  [name] UYQ:[id number] DOB: May 05, 1933 DOA: 02/06/2018 PCP: Kirk Ruths, MD  HPI/Subjective: Patient continues to be short of breath requiring BiPAP   Objective: Vitals:   01/23/18 1100 01/23/18 1200  BP: (!) 134/123   Pulse: (!) 108 (!) 115  Resp: (!) 30 (!) 35  Temp:  97.9 F (36.6 C)  SpO2: 92% 97%    Filed Weights   01/21/18 1534 01/22/18 0500 01/23/18 0754  Weight: 77.8 kg 77.7 kg 75.7 kg    ROS: Review of Systems  Constitutional: Negative for chills and fever.  Eyes: Negative for blurred vision.  Respiratory: Positive for cough and shortness of breath.   Cardiovascular: Negative for chest pain.  Gastrointestinal: Negative for abdominal pain, constipation, diarrhea, nausea and vomiting.  Genitourinary: Negative for dysuria.  Musculoskeletal: Negative for joint pain.  Neurological: Negative for dizziness and headaches.   Exam: Physical Exam  Constitutional: She is oriented to person, place, and time.  HENT:  Nose: No mucosal edema.  Mouth/Throat: No oropharyngeal exudate or posterior oropharyngeal edema.  Eyes: Pupils are equal, round, and reactive to light. Conjunctivae, EOM and lids are normal.  Neck: No JVD present. Carotid bruit is not present. No edema present. No thyroid mass and no thyromegaly present.  Cardiovascular: S1 normal and S2 normal. Exam reveals no gallop.  No murmur heard. Pulses:      Dorsalis pedis pulses are 2+ on the right side, and 2+ on the left side.  Respiratory: Accessory muscle usage present. No respiratory distress. She has decreased breath sounds in the right middle field, the right lower field, the left middle field and the left lower field. She has no wheezes. She has rhonchi in the right middle field and the left middle field. She has rales in the right lower field and the left lower field.  GI: Soft.  Bowel sounds are normal. There is no tenderness.  Musculoskeletal:       Right ankle: She exhibits no swelling.       Left ankle: She exhibits no swelling.  Lymphadenopathy:    She has no cervical adenopathy.  Neurological: She is alert and oriented to person, place, and time. No cranial nerve deficit.  Skin: Skin is warm. No rash noted. Nails show no clubbing.  Psychiatric: She has a normal mood and affect.      Data Reviewed: Basic Metabolic Panel: Recent Labs  Lab 01/20/2018 1031 01/21/18 0530 01/22/18 0606 01/23/18 0433  NA 140 141 145 146*  K 3.6 3.9 3.7 3.2*  CL 102 101 99 104  CO2 29 31 32 31  GLUCOSE 139* 143* 141* 148*  BUN 17 28* 36* 48*  CREATININE 1.03* 0.99 1.11* 1.09*  CALCIUM 9.1 9.6 9.7 9.5  MG  --   --  2.4 2.5*  PHOS  --   --  3.1 2.8   CBC: Recent Labs  Lab 02/09/2018 1031 01/21/18 0530 01/22/18 0606 01/23/18 0433  WBC 13.4* 37.0* 29.0* 18.8*  NEUTROABS  --   --  26.8* 17.4*  HGB 11.7* 12.1 12.6 12.3  HCT 36.3 38.7 40.4 38.4  MCV 79.7* 79.7* 79.3* 79.7*  PLT 282 334 350 325   Cardiac Enzymes: Recent Labs  Lab 02/06/2018 1031  TROPONINI <0.03   BNP (last 3 results) Recent Labs    11/30/17 1628 01/22/2018 1036 01/22/18 0606  BNP 252.0* 88.0 131.0*  CBG: Recent Labs  Lab 01/22/18 2026 01/23/18 0022 01/23/18 0341 01/23/18 0723 01/23/18 1129  GLUCAP 110* 130* 129* 138* 119*    Recent Results (from the past 240 hour(s))  Blood culture (routine x 2)     Status: None (Preliminary result)   Collection Time: 02/07/2018 11:42 AM  Result Value Ref Range Status   Specimen Description BLOOD BLOOD RIGHT FOREARM  Final   Special Requests   Final    BOTTLES DRAWN AEROBIC AND ANAEROBIC Blood Culture adequate volume   Culture   Final    NO GROWTH 4 DAYS Performed at [facility], [address] [place], [place] [address]    Report Status PENDING  Incomplete  Blood culture (routine x 2)     Status: None (Preliminary result)    Collection Time: 01/28/2018 12:09 PM  Result Value Ref Range Status   Specimen Description BLOOD BLOOD LEFT HAND  Final   Special Requests   Final    BOTTLES DRAWN AEROBIC AND ANAEROBIC Blood Culture results may not be optimal due to an inadequate volume of blood received in culture bottles   Culture   Final    NO GROWTH 4 DAYS Performed at [facility], [address], [place], [place] [address]    Report Status PENDING  Incomplete  MRSA PCR Screening     Status: None   Collection Time: 01/20/18  6:00 AM  Result Value Ref Range Status   MRSA by PCR NEGATIVE NEGATIVE Final    Comment:        The GeneXpert MRSA Assay (FDA approved for NASAL specimens only), is one component of a comprehensive MRSA colonization surveillance program. It is not intended to diagnose MRSA infection nor to guide or monitor treatment for MRSA infections. Performed at [facility], [address] [place], [place] [address]      Studies: Dg Chest [facility] 1 View  Result Date: 01/23/2018 CLINICAL DATA:  Shortness of Breath EXAM: PORTABLE CHEST 1 VIEW COMPARISON:  January 22, 2018 chest radiograph and chest CT January 19, 2018 FINDINGS: Diffuse pulmonary fibrosis is again noted without appreciable change. There is no frank edema or consolidation. Heart is mildly enlarged with pulmonary vascularity normal. There is aortic atherosclerosis. There is postoperative change in the lower cervical region. There is adenopathy in the right hilar region. Areas of adenopathy elsewhere are better seen on recent CT examination. IMPRESSION: Stable pulmonary fibrosis. Stable cardiac silhouette. Right hilar adenopathy evident. Adenopathy in other regions is noted on CT and much better seen on CT than on this current radiographic examination. Electronically Signed   By: Lowella Grip III M.D.   On: 01/23/2018 07:18   Dg Chest Port 1 View  Result Date: 01/22/2018 CLINICAL DATA:  CHF EXAM: PORTABLE CHEST 1 VIEW  COMPARISON:  Multiple priors, including chest radiograph dated 01/21/2018 and CT chest dated 02/05/2018. FINDINGS: Multifocal patchy/interstitial opacities, right lung greater than left, although left lower lobe predominant. The patient has known underlying chronic shin lung disease with subpleural reticulation/fibrosis. Superimposed pneumonia is suspected, particularly in the right upper lobe. Interstitial edema is considered less likely given the pattern. No definite pleural effusions. The heart is normal in size. Cervical spine fixation hardware. IMPRESSION: Chronic interstitial lung disease with suspected superimposed patchy opacities/pneumonia in the right upper lobe. Electronically Signed   By: Julian Hy M.D.   On: 01/22/2018 08:16    Scheduled Meds: . albuterol  3 mL Inhalation Q6H  . aspirin EC  81  mg Oral Daily  . budesonide (PULMICORT) nebulizer solution  0.5 mg Nebulization BID  . chlorhexidine  15 mL Mouth Rinse BID  . darifenacin  7.5 mg Oral Daily  . doxycycline  100 mg Oral Q12H  . DULoxetine  20 mg Oral Daily  . enoxaparin (LOVENOX) injection  40 mg Subcutaneous Q24H  . gabapentin  400 mg Oral QHS  . insulin aspart  0-20 Units Subcutaneous Q4H  . insulin aspart  0-5 Units Subcutaneous QHS  . lisinopril  5 mg Oral Daily  . mouth rinse  15 mL Mouth Rinse q12n4p  . methylPREDNISolone (SOLU-MEDROL) injection  40 mg Intravenous Q12H  . metoprolol tartrate  25 mg Per Tube BID  . multivitamin with minerals  1 tablet Oral Daily  . simvastatin  20 mg Oral Daily  . cyanocobalamin  1,000 mcg Oral Daily   Continuous Infusions: . famotidine (PEPCID) IV Stopped (01/23/18 1210)    Assessment/Plan:  1. Acute hypoxic respiratory failure.  Continue high flow oxygen BiPAP as needed not much improvement with the treatment per pulmonary 2. Clinical sepsis secondary to pneumonia.  Patient on Maxipime 3. COPD exacerbation continue high-dose steroids nebulizer therapy 4. Acute on  chronic diastolic congestive heart failure with IHSS.  Continue IV Lasix to renal function 5. essential hypertension.  Continue metoprolol, lisinopril and hydralazine 6. GERD on Protonix 7. Hyperlipidemia on Zocor 8. Neuropathy on gabapentin  Code Status:     Code Status Orders  (From admission, onward)         Start     Ordered   01/25/2018 1340  Do not attempt resuscitation (DNR)  Continuous    Question Answer Comment  In the event of cardiac or respiratory ARREST Do not call a "code blue"   In the event of cardiac or respiratory ARREST Do not perform Intubation, CPR, defibrillation or ACLS   In the event of cardiac or respiratory ARREST Use medication by any route, position, wound care, and other measures to relive pain and suffering. May use oxygen, suction and manual treatment of airway obstruction as needed for comfort.   Comments Nurse may pronounce      01/18/2018 1339        Code Status History    Date Active Date Inactive Code Status Order ID Comments User Context   11/30/2017 2022 12/04/2017 1610 Full Code [id number]  Fritzi Mandes, MD Inpatient   04/18/2017 2345 04/19/2017 2040 Full Code [id number]  Dustin Flock, MD Inpatient   02/13/2017 1625 02/17/2017 1517 Full Code [id number]  Nicholes Mango, MD Inpatient    Advance Directive Documentation     Most Recent Value  Type of Advance Directive  Living will  Pre-existing out of facility DNR order (yellow form or pink MOST form)  -  "MOST" Form in Place?  -     Family Communication: Spoke with son on the phone Disposition Plan: Pending Consultants:  Critical care specialist  Antibiotics:  Vancomycin  Cefepime  Time spent: 35 minutes [place]

## 2018-01-23 NOTE — Consult Note (Signed)
Consultation Note Date: 01/23/2018   Patient Name: [name]  DOB: 03/20/1933  MRN: [id number]  Age / Sex: 84 y.o., female  PCP: Kirk Ruths, MD Referring Physician: Dustin Flock, MD  Reason for Consultation: Establishing goals of care  HPI/Patient Profile: 84 y.o. female  with past medical history of diastolic CHF, COPD 2L at night, h/o cancer, right ear deafness, DM, neuropathy, HTN, vertigo, dyspnea, anxiety, degenerative disc disease admitted on 02/02/2018 with shortness of breath and treatment for sepsis r/t HCAP, COPD, and CHF and becoming dependent on BiPAP.   Clinical Assessment and Goals of Care: I met today with Ms. Prazak along with her 3 children at bedside. Daughter, Quintessa, is "in charge" per Ms. Tomasita Crumble. Ms. Beynon also lives with her son but he has some of his own health concerns. They are able to tell me that Ms. Rao has had some gradual decline but that it did seem more sudden when she was admitted to the hospital. She has recently been instructed to use 2L oxygen 24/7 the past month (since last hospitalization) whereas she was previously utilizing 2L at night. In speaking with patient and family they seemed less aware of the severity of her chronic illnesses. We discussed that we have been treating her for pneumonia in combination with worsening pulmonary fibrosis and fluid from CHF. Discussed the complexity and difficulty in how these disease trajectories work against one another.   Today at this time Ms. Renderos is "feeling better" and is hopeful for continued improvement with goal to return home. I encouraged her to take one day at a time as she still has high oxygen requirements. Allowed her to celebrate being off BiPAP at this time. The subject of intubation/resuscitation was brought up with Ms. Jerry saying "he wants me to cut this off my arm [referring to DNR armband]."  We further discussed what DNR means and Ms. Goodlin does confirm that she does NOT desire intubation/CPR/shock acknowledging that this would lead to death. She does desire current interventions including BiPAP with hopes of further improvement. Clarified with children at bedside that DNR does not mean we do not intervene with attempts to prevent worsening that would lead to resuscitation and death.   Family was clear that they have not spoken with a doctor yet and hoping to soon. Some tension and anxiety in room so I did NOT take this opportunity to introduce hospice and allow them to speak with medical staff and I will follow along and continue [place] conversations and support to patient and family. Emotional support provided.   Primary Decision Maker Ms. Acklin able to make her own decisions. She refers to [place] as her surrogate and will need to discuss documented HCPOA with Ms. Kinnamon privately.     SUMMARY OF RECOMMENDATIONS   - DNR confirmed - Hopeful for continued improvement and less oxygen needs - Hopeful to return home  Code Status/Advance Care Planning:  DNR   Symptom Management:   Per PCCM  Palliative Prophylaxis:   Aspiration, Delirium  Protocol, Oral Care and Turn Reposition  Additional Recommendations (Limitations, Scope, Preferences):  No Artificial Feeding and No Tracheostomy  Psycho-social/Spiritual:   Desire for further Chaplaincy support:no  Additional Recommendations: Caregiving  Support/Resources and Education on Hospice  Prognosis:   Overall prognosis is poor given severity of co-morbidities. Will follow progress. Likely eligible for hospice at home but did not discuss today.   Discharge Planning: To Be Determined      Primary Diagnoses: Present on Admission: . HCAP (healthcare-associated pneumonia)   I have reviewed the medical record, interviewed the patient and family, and examined the patient. The following aspects are pertinent.  Past  Medical History:  Diagnosis Date  . Anxiety   . Arthritis    hands  . Cancer ([place])   . CHF (congestive heart failure) (HCC)    swelling of feet and legs  . COPD (chronic obstructive pulmonary disease) (HCC)    O2 use at night 2 liters  . DDD (degenerative disc disease), cervical    cervical laminectomy x 2  . Deaf, right   . Depression   . Diabetes mellitus without complication ([place])   . GERD (gastroesophageal reflux disease)   . High cholesterol   . History of hiatal hernia   . Hypertension    controlled on meds  . Neuromuscular disorder (HCC)    neuropathy  . PONV (postoperative nausea and vomiting)   . Shortness of breath dyspnea   . Vertigo    last episode approx 1 yr ago  . Wears dentures    full upper and lower   Social History   Socioeconomic History  . Marital status: Widowed    Spouse name: Not on file  . Number of children: Not on file  . Years of education: Not on file  . Highest education level: Not on file  Occupational History  . Not on file  Social Needs  . Financial resource strain: Not on file  . Food insecurity:    Worry: Not on file    Inability: Not on file  . Transportation needs:    Medical: Not on file    Non-medical: Not on file  Tobacco Use  . Smoking status: Former Smoker    Packs/day: 0.25    Years: 20.00    Pack years: 5.00    Types: Cigarettes    Last attempt to quit: 03/14/2000    Years since quitting: 17.8  . Smokeless tobacco: Never Used  Substance and Sexual Activity  . Alcohol use: No  . Drug use: No  . Sexual activity: Not on file  Lifestyle  . Physical activity:    Days per week: Not on file    Minutes per session: Not on file  . Stress: Not on file  Relationships  . Social connections:    Talks on phone: Not on file    Gets together: Not on file    Attends religious service: Not on file    Active member of club or organization: Not on file    Attends meetings of clubs or organizations: Not on file     Relationship status: Not on file  Other Topics Concern  . Not on file  Social History Narrative  . Not on file   No family history on file. Scheduled Meds: . albuterol  3 mL Inhalation Q6H  . aspirin EC  81 mg Oral Daily  . budesonide (PULMICORT) nebulizer solution  0.5 mg Nebulization BID  . chlorhexidine  15 mL Mouth Rinse BID  .  darifenacin  7.5 mg Oral Daily  . DULoxetine  20 mg Oral Daily  . enoxaparin (LOVENOX) injection  40 mg Subcutaneous Q24H  . furosemide  20 mg Intravenous Daily  . gabapentin  400 mg Oral QHS  . insulin aspart  0-20 Units Subcutaneous Q4H  . insulin aspart  0-5 Units Subcutaneous QHS  . lisinopril  5 mg Oral Daily  . mouth rinse  15 mL Mouth Rinse q12n4p  . methylPREDNISolone (SOLU-MEDROL) injection  40 mg Intravenous Q12H  . metoprolol tartrate  25 mg Per Tube BID  . multivitamin with minerals  1 tablet Oral Daily  . simvastatin  20 mg Oral Daily  . cyanocobalamin  1,000 mcg Oral Daily   Continuous Infusions: . ceFEPime (MAXIPIME) IV Stopped (01/22/18 2225)  . famotidine (PEPCID) IV Stopped (01/22/18 1246)  . potassium chloride     PRN Meds:.clobetasol cream, hydrALAZINE, ipratropium-albuterol, metoCLOPramide (REGLAN) injection, traMADol Allergies  Allergen Reactions  . Ciprofloxacin Nausea And Vomiting  . Hydrocodone Nausea Only  . Sertraline Nausea And Vomiting  . Codeine Nausea And Vomiting   Review of Systems  Constitutional: Positive for activity change.  Respiratory: Positive for shortness of breath.   Psychiatric/Behavioral: The patient is nervous/anxious.     Physical Exam  Constitutional: She is oriented to person, place, and time. She appears well-developed. She appears ill.  HENT:  Head: Normocephalic and atraumatic.  Cardiovascular: Tachycardia present.  Pulmonary/Chest: No accessory muscle usage. Tachypnea noted. She is in respiratory distress.  Moderate distress and tachypnea intermittently at rest and with conversation.  Purse lipped breathing and she recovers well.   Abdominal: Normal appearance.  Neurological: She is alert and oriented to person, place, and time.  Nursing note and vitals reviewed.   Vital Signs: BP (!) 160/63 (BP Location: Left Arm)   Pulse (!) 106   Temp 97.9 F (36.6 C) (Axillary)   Resp (!) 24   Ht '5\' 4"'  (1.626 m)   Wt 75.7 kg   SpO2 96%   BMI 28.65 kg/m  Pain Scale: 0-10 POSS *See Group Information*: S-Acceptable,Sleep, easy to arouse Pain Score: 0-No pain   SpO2: SpO2: 96 % O2 Device:SpO2: 96 % O2 Flow Rate: .O2 Flow Rate (L/min): 36 L/min  IO: Intake/output summary:   Intake/Output Summary (Last 24 hours) at 01/23/2018 1129 Last data filed at 01/23/2018 0000 Gross per 24 hour  Intake 316.67 ml  Output 650 ml  Net -333.33 ml    LBM: Last BM Date: 01/18/18 Baseline Weight: Weight: 74.8 kg Most recent weight: Weight: 75.7 kg     Palliative Assessment/Data: 30%    Time Total: 75 min  Greater than 50%  of this time was spent counseling and coordinating care related to the above assessment and plan.  Signed by: Vinie Sill, NP Palliative Medicine Team Pager # [phone] (M-F 8a-5p) Team Phone # [phone] (Nights/Weekends)

## 2018-01-24 DIAGNOSIS — J8410 Pulmonary fibrosis, unspecified: Secondary | ICD-10-CM

## 2018-01-24 DIAGNOSIS — J9621 Acute and chronic respiratory failure with hypoxia: Secondary | ICD-10-CM

## 2018-01-24 LAB — CULTURE, BLOOD (ROUTINE X 2)
Culture: NO GROWTH
Culture: NO GROWTH
SPECIAL REQUESTS: ADEQUATE

## 2018-01-24 LAB — BASIC METABOLIC PANEL
ANION GAP: 7 (ref 5–15)
BUN: 50 mg/dL — ABNORMAL HIGH (ref 8–23)
CHLORIDE: 106 mmol/L (ref 98–111)
CO2: 34 mmol/L — AB (ref 22–32)
CREATININE: 0.98 mg/dL (ref 0.44–1.00)
Calcium: 9.8 mg/dL (ref 8.9–10.3)
GFR calc Af Amer: >60 mL/min (ref >60)
GFR calc non Af Amer: 52 mL/min — ABNORMAL LOW (ref >60)
Glucose, Bld: 144 mg/dL — ABNORMAL HIGH (ref 70–99)
Potassium: 4.1 mmol/L (ref 3.5–5.1)
Sodium: 147 mmol/L — ABNORMAL HIGH (ref 135–145)

## 2018-01-24 LAB — GLUCOSE, CAPILLARY
GLUCOSE-CAPILLARY: 106 mg/dL — AB (ref 70–99)
GLUCOSE-CAPILLARY: 104 mg/dL — AB (ref 70–99)
Glucose-Capillary: 125 mg/dL — ABNORMAL HIGH (ref 70–99)
Glucose-Capillary: 135 mg/dL — ABNORMAL HIGH (ref 70–99)
Glucose-Capillary: 137 mg/dL — ABNORMAL HIGH (ref 70–99)
Glucose-Capillary: 142 mg/dL — ABNORMAL HIGH (ref 70–99)

## 2018-01-24 LAB — CBC
HEMATOCRIT: 39.3 % (ref 35.0–47.0)
HEMOGLOBIN: 12.6 g/dL (ref 12.0–16.0)
MCH: 25.7 pg — ABNORMAL LOW (ref 26.0–34.0)
MCHC: 32 g/dL (ref 32.0–36.0)
MCV: 80.1 fL (ref 80.0–100.0)
Platelets: 352 10*3/uL (ref 150–440)
RBC: 4.91 MIL/uL (ref 3.80–5.20)
RDW: 18.2 % — ABNORMAL HIGH (ref 11.5–14.5)
WBC: 18.1 10*3/uL — ABNORMAL HIGH (ref 3.6–11.0)

## 2018-01-24 LAB — CALCIUM, IONIZED
Calcium, Ionized, Serum: 5.1 mg/dL (ref 4.5–5.6)
Calcium, Ionized, Serum: 5.3 mg/dL (ref 4.5–5.6)

## 2018-01-24 MED ORDER — GUAIFENESIN ER 600 MG PO TB12
600.0000 mg | ORAL_TABLET | Freq: Two times a day (BID) | ORAL | Status: DC
  Administered 2018-01-24 – 2018-01-27 (×6): 600 mg via ORAL
  Filled 2018-01-24 (×6): qty 1

## 2018-01-24 MED ORDER — METOPROLOL TARTRATE 25 MG PO TABS
25.0000 mg | ORAL_TABLET | Freq: Two times a day (BID) | ORAL | Status: DC
  Administered 2018-01-24 – 2018-01-27 (×6): 25 mg via ORAL
  Filled 2018-01-24 (×6): qty 1

## 2018-01-24 MED ORDER — FUROSEMIDE 20 MG PO TABS
20.0000 mg | ORAL_TABLET | Freq: Every day | ORAL | Status: DC
  Administered 2018-01-24 – 2018-01-25 (×2): 20 mg via ORAL
  Filled 2018-01-24 (×2): qty 1

## 2018-01-24 MED ORDER — ENSURE ENLIVE PO LIQD
237.0000 mL | Freq: Two times a day (BID) | ORAL | Status: DC
  Administered 2018-01-26 – 2018-01-28 (×3): 237 mL via ORAL

## 2018-01-24 MED ORDER — INSULIN ASPART 100 UNIT/ML ~~LOC~~ SOLN: 0.0000 [IU] | Freq: Every day | SUBCUTANEOUS | Status: DC

## 2018-01-24 MED ORDER — INSULIN ASPART 100 UNIT/ML ~~LOC~~ SOLN
0.0000 [IU] | Freq: Three times a day (TID) | SUBCUTANEOUS | Status: DC
Start: 2018-01-24 — End: 2018-01-28
  Administered 2018-01-25: 1 [IU] via SUBCUTANEOUS
  Administered 2018-01-25: 2 [IU] via SUBCUTANEOUS
  Administered 2018-01-26: 3 [IU] via SUBCUTANEOUS
  Administered 2018-01-26 – 2018-01-27 (×2): 2 [IU] via SUBCUTANEOUS
  Administered 2018-01-27: 3 [IU] via SUBCUTANEOUS
  Filled 2018-01-24 (×6): qty 1

## 2018-01-24 MED ORDER — FAMOTIDINE 20 MG PO TABS
20.0000 mg | ORAL_TABLET | Freq: Every day | ORAL | Status: DC
Start: 2018-01-24 — End: 2018-01-29
  Administered 2018-01-24 – 2018-01-27 (×4): 20 mg via ORAL
  Filled 2018-01-24 (×4): qty 1

## 2018-01-24 MED ORDER — PREDNISONE 10 MG PO TABS
60.0000 mg | ORAL_TABLET | Freq: Every day | ORAL | Status: DC
  Administered 2018-01-25: 60 mg via ORAL
  Filled 2018-01-24: qty 1

## 2018-01-24 NOTE — Progress Notes (Signed)
Patient ID: [name], female   DOB: 1933/01/18, 84 y.o.   MRN: [id number]  [facility] PROGRESS NOTE  THAILYN KHALID WUJ:[id number] DOB: 1933/04/08 DOA: 01/30/2018 PCP: Kirk Ruths, MD  HPI/Subjective: Currently on high flow oxygen   Objective: Vitals:   01/24/18 0800 01/24/18 0911  BP: (!) 143/59   Pulse: 87   Resp: 18   Temp: 97.8 F (36.6 C)   SpO2: 95% (!) 88%    Filed Weights   01/22/18 0500 01/23/18 0754 01/24/18 0432  Weight: 77.7 kg 75.7 kg 75.1 kg    ROS: Review of Systems  Constitutional: Negative for chills and fever.  Eyes: Negative for blurred vision.  Respiratory: Positive for cough and shortness of breath.   Cardiovascular: Negative for chest pain.  Gastrointestinal: Negative for abdominal pain, constipation, diarrhea, nausea and vomiting.  Genitourinary: Negative for dysuria.  Musculoskeletal: Negative for joint pain.  Neurological: Negative for dizziness and headaches.   Exam: Physical Exam  Constitutional: She is oriented to person, place, and time.  HENT:  Nose: No mucosal edema.  Mouth/Throat: No oropharyngeal exudate or posterior oropharyngeal edema.  Eyes: Pupils are equal, round, and reactive to light. Conjunctivae, EOM and lids are normal.  Neck: No JVD present. Carotid bruit is not present. No edema present. No thyroid mass and no thyromegaly present.  Cardiovascular: S1 normal and S2 normal. Exam reveals no gallop.  No murmur heard. Pulses:      Dorsalis pedis pulses are 2+ on the right side, and 2+ on the left side.  Respiratory: Accessory muscle usage present. No respiratory distress. She has decreased breath sounds in the right middle field, the right lower field, the left middle field and the left lower field. She has no wheezes. She has rhonchi in the right middle field and the left middle field. She has rales in the right lower field and the left lower field.  GI: Soft. Bowel sounds are normal. There is no tenderness.   Musculoskeletal:       Right ankle: She exhibits no swelling.       Left ankle: She exhibits no swelling.  Lymphadenopathy:    She has no cervical adenopathy.  Neurological: She is alert and oriented to person, place, and time. No cranial nerve deficit.  Skin: Skin is warm. No rash noted. Nails show no clubbing.  Psychiatric: She has a normal mood and affect.      Data Reviewed: Basic Metabolic Panel: Recent Labs  Lab 02/04/2018 1031 01/21/18 0530 01/22/18 0606 01/23/18 0433 01/24/18 0433  NA 140 141 145 146* 147*  K 3.6 3.9 3.7 3.2* 4.1  CL 102 101 99 104 106  CO2 29 31 32 31 34*  GLUCOSE 139* 143* 141* 148* 144*  BUN 17 28* 36* 48* 50*  CREATININE 1.03* 0.99 1.11* 1.09* 0.98  CALCIUM 9.1 9.6 9.7 9.5 9.8  MG  --   --  2.4 2.5*  --   PHOS  --   --  3.1 2.8  --    CBC: Recent Labs  Lab 01/21/2018 1031 01/21/18 0530 01/22/18 0606 01/23/18 0433 01/24/18 0433  WBC 13.4* 37.0* 29.0* 18.8* 18.1*  NEUTROABS  --   --  26.8* 17.4*  --   HGB 11.7* 12.1 12.6 12.3 12.6  HCT 36.3 38.7 40.4 38.4 39.3  MCV 79.7* 79.7* 79.3* 79.7* 80.1  PLT 282 334 350 325 352   Cardiac Enzymes: Recent Labs  Lab 01/26/2018 1031  TROPONINI <0.03   BNP (  last 3 results) Recent Labs    11/30/17 1628 01/22/2018 1036 01/22/18 0606  BNP 252.0* 88.0 131.0*    CBG: Recent Labs  Lab 01/23/18 1953 01/23/18 2344 01/24/18 0356 01/24/18 0739 01/24/18 1215  GLUCAP 87 135* 137* 142* 106*    Recent Results (from the past 240 hour(s))  Blood culture (routine x 2)     Status: None   Collection Time: 02/02/2018 11:42 AM  Result Value Ref Range Status   Specimen Description BLOOD BLOOD RIGHT FOREARM  Final   Special Requests   Final    BOTTLES DRAWN AEROBIC AND ANAEROBIC Blood Culture adequate volume   Culture   Final    NO GROWTH 5 DAYS Performed at [facility], [address] [place], [place] [address]    Report Status 01/24/2018 FINAL  Final  Blood culture (routine x 2)      Status: None   Collection Time: 02/01/2018 12:09 PM  Result Value Ref Range Status   Specimen Description BLOOD BLOOD LEFT HAND  Final   Special Requests   Final    BOTTLES DRAWN AEROBIC AND ANAEROBIC Blood Culture results may not be optimal due to an inadequate volume of blood received in culture bottles   Culture   Final    NO GROWTH 5 DAYS Performed at [facility], [address] [place], [place] [address]    Report Status 01/24/2018 FINAL  Final  MRSA PCR Screening     Status: None   Collection Time: 01/20/18  6:00 AM  Result Value Ref Range Status   MRSA by PCR NEGATIVE NEGATIVE Final    Comment:        The GeneXpert MRSA Assay (FDA approved for NASAL specimens only), is one component of a comprehensive MRSA colonization surveillance program. It is not intended to diagnose MRSA infection nor to guide or monitor treatment for MRSA infections. Performed at [facility], [address] [place], [place] [address]      Studies: Dg Chest [facility] 1 View  Result Date: 01/23/2018 CLINICAL DATA:  Shortness of Breath EXAM: PORTABLE CHEST 1 VIEW COMPARISON:  January 22, 2018 chest radiograph and chest CT January 19, 2018 FINDINGS: Diffuse pulmonary fibrosis is again noted without appreciable change. There is no frank edema or consolidation. Heart is mildly enlarged with pulmonary vascularity normal. There is aortic atherosclerosis. There is postoperative change in the lower cervical region. There is adenopathy in the right hilar region. Areas of adenopathy elsewhere are better seen on recent CT examination. IMPRESSION: Stable pulmonary fibrosis. Stable cardiac silhouette. Right hilar adenopathy evident. Adenopathy in other regions is noted on CT and much better seen on CT than on this current radiographic examination. Electronically Signed   By: Lowella Grip III M.D.   On: 01/23/2018 07:18    Scheduled Meds: . aspirin EC  81 mg Oral Daily  . chlorhexidine  15 mL  Mouth Rinse BID  . darifenacin  7.5 mg Oral Daily  . doxycycline  100 mg Oral Q12H  . DULoxetine  20 mg Oral Daily  . enoxaparin (LOVENOX) injection  40 mg Subcutaneous Q24H  . famotidine  20 mg Oral Daily  . [START ON 01/25/2018] feeding supplement (ENSURE ENLIVE)  237 mL Oral BID BM  . gabapentin  400 mg Oral QHS  . insulin aspart  0-15 Units Subcutaneous TID WC  . insulin aspart  0-5 Units Subcutaneous QHS  . lisinopril  5 mg Oral Daily  . mouth rinse  15  mL Mouth Rinse q12n4p  . methylPREDNISolone (SOLU-MEDROL) injection  40 mg Intravenous Q12H  . metoprolol tartrate  25 mg Oral BID  . multivitamin with minerals  1 tablet Oral Daily  . [START ON 01/25/2018] predniSONE  60 mg Oral Q breakfast  . simvastatin  20 mg Oral Daily  . cyanocobalamin  1,000 mcg Oral Daily   Continuous Infusions:   Assessment/Plan:  1. Acute hypoxic respiratory failure.  Continue high flow oxygen BiPAP as needed not much improvement with the treatment per pulmonary 2. Clinical sepsis secondary to pneumonia.  Continue doxycycline 3. COPD exacerbation continue high-dose steroids nebulizer therapy, with plan for steroid taper 4. Acute on chronic diastolic congestive heart failure with IHSS.  Continue IV Lasix to renal function 5. essential hypertension.  Continue metoprolol, lisinopril and hydralazine 6. GERD on Protonix 7. Hyperlipidemia on Zocor 8. Neuropathy on gabapentin  Code Status:     Code Status Orders  (From admission, onward)         Start     Ordered   01/14/2018 1340  Do not attempt resuscitation (DNR)  Continuous    Question Answer Comment  In the event of cardiac or respiratory ARREST Do not call a "code blue"   In the event of cardiac or respiratory ARREST Do not perform Intubation, CPR, defibrillation or ACLS   In the event of cardiac or respiratory ARREST Use medication by any route, position, wound care, and other measures to relive pain and suffering. May use oxygen, suction and  manual treatment of airway obstruction as needed for comfort.   Comments Nurse may pronounce      02/11/2018 1339        Code Status History    Date Active Date Inactive Code Status Order ID Comments User Context   11/30/2017 2022 12/04/2017 1610 Full Code [id number]  Fritzi Mandes, MD Inpatient   04/18/2017 2345 04/19/2017 2040 Full Code [id number]  Dustin Flock, MD Inpatient   02/13/2017 1625 02/17/2017 1517 Full Code [id number]  Nicholes Mango, MD Inpatient    Advance Directive Documentation     Most Recent Value  Type of Advance Directive  Living will  Pre-existing out of facility DNR order (yellow form or pink MOST form)  -  "MOST" Form in Place?  -     Family Communication: Spoke with son on the phone Disposition Plan: Pending Consultants:  Critical care specialist  Antibiotics:  Vancomycin  Cefepime  Time spent: 35 minutes [place]

## 2018-01-24 NOTE — Progress Notes (Signed)
Speech Language Pathology Dysphagia Treatment Patient Details Name: [name] MRN: [id number] DOB: 1932-07-13 Today's Date: 01/24/2018 Time: 5750-5183 SLP Time Calculation (min) (ACUTE ONLY): 35 min  Assessment / Plan / Recommendation Clinical Impression   pt continues to present with no significant oral pharyngeal dysphagia as characterized by no overt ssx aspiration with solids or liquids. Pt was able to intake 8oz thin liquid via straw with no overt ssx aspiration. Pt stated no feeling of bolus becoming fixed or having difficulty with swallow. Pt was able to intake 4oz puree, and 4oz regular textures with functional mastication, appropriate a-p transit time and no coughing or chinking noted. Pt stated she only has difficulty with "stringy meats" such as pot roast. SLP provided education to eat small bites and sips and avoid difficult foods. Pt able to repeat strategies and demonstrate appropriate swallow techniques. SLP educated pt and NSG that SLP will sign off and to contact slp if changes in status occur.     Diet Recommendation    Regular with thin via cup or straw.    SLP Plan Discharge SLP treatment due to (comment)(patient has met goals. )   Pertinent Vitals/Pain None reported   Swallowing Goals     General Behavior/Cognition: Alert;Cooperative;Pleasant mood Patient Positioning: Upright in bed Oral care provided: N/A HPI: Per admitting H&P: [name]  is a 84 y.o. female with a known history per below, recent hospital admission for pneumonia, presented to the emergency room with a one-week history of worsening shortness of breath with associated cough, no better with breathing treatments, patient was noted to be satting 60% on 4 L via nasal cannula as checked by her home health nurse, patient was brought to the emergency room via EMS, did receive DuoNeb's, patient placed on nonrebreather in the emergency room with O2 saturation in the 80s, noted tachypnea, work-up noted for  pneumonia on chest x-ray, evaluated emergency room, daughter at the bedside, patient now be admitted for acute sepsis due to acute H CAP.  Oral Cavity - Oral Hygiene     Dysphagia Treatment Treatment Methods: Skilled observation;Patient/caregiver education Patient observed directly with PO's: Yes Type of PO's observed: Regular;Thin liquids Feeding: Able to feed self Liquids provided via: Straw Amount of cueing: Independent   GO     [place] Sauber 01/24/2018, 2:22 PM

## 2018-01-24 NOTE — Progress Notes (Signed)
Nutrition Follow-up  DOCUMENTATION CODES:   Not applicable  INTERVENTION:  Recommend liberalizing diet to just carbohydrate modified.  Provide Ensure Enlive po BID, each supplement provides 350 kcal and 20 grams of protein.  Continue daily MVI.  NUTRITION DIAGNOSIS:   Increased nutrient needs related to chronic illness(COPD, pulmonary fibrosis) as evidenced by estimated needs.  Ongoing.  GOAL:   Patient will meet greater than or equal to 90% of their needs  Progressing.  MONITOR:   PO intake, Supplement acceptance, Labs, Weight trends, Skin, I & O's  REASON FOR ASSESSMENT:   Consult Assessment of nutrition requirement/status, Enteral/tube feeding initiation and management  ASSESSMENT:   84 y.o. female with a history of pulmonary fibrosis on 3 L nasal cannula, CHF with preserved EF, hypertension, hyperlipidemia, diabetes who presents for evaluation of shortness of breath.   -Patient was able to come off BiPAP yesterday and diet was advanced to 2 gram sodium/carbohydrate modified.  Met with patient at bedside this AM. She is on HFNC. She reports she did not eat well at breakfast this morning (~50%) because the food was cold and did not have any flavor. She reports typically eating fairly well. She likes Ensure and drinks it at home.   Patient had previously refused placement of Dobbhoff tube on Saturday. Per PMT note patient does not want any artificial nutrition.  Medications reviewed and include: doxycycline, famotidine, gabapentin, Novolog 0-15 units TID, Novolog 0-5 units QHS, lisinopril, Solu-Medrol 40 mg Q12h rs today, prednisone 60 mg daily starting tomorrow, MVI daily, vitamin B12 1000 mcg daily.  Labs reviewed: CBG 106-142, Sodium 147, CO2 34, BUN 50.  Discussed with RN and on rounds.  Diet Order:   Diet Order            Diet 2 gram sodium Room service appropriate? Yes; Fluid consistency: Thin  Diet effective now              EDUCATION NEEDS:    Education needs have been addressed  Skin:  Skin Assessment: Reviewed RN Assessment(scattered ecchymosis)  Last BM:  01/18/2018  Height:   Ht Readings from Last 1 Encounters:  01/20/18 _0  (1.626 m)    Weight:   Wt Readings from Last 1 Encounters:  01/24/18 75.1 kg    Ideal Body Weight:  54.5 kg  BMI:  Body mass index is 28.42 kg/m.  Estimated Nutritional Needs:   Kcal:  1500-1700kcal/day   Protein:  77-85g/day   Fluid:  >1.5L/day or per MD  Willey Blade, MS, RD, LDN Office: [phone] Pager: [phone] After Hours/Weekend Pager: [phone]

## 2018-01-24 NOTE — Progress Notes (Signed)
Name: CIIN BRAZZEL MRN: [id number] DOB: August 16, 1932     CONSULTATION DATE: 01/28/2018  Subjective & Objectives: On HFNC Afebrile Denies SOB or chest pain States she is feeling a little better Reports nonproductive cough  PAST MEDICAL HISTORY :   has a past medical history of Anxiety, Arthritis, Cancer ([place]), CHF (congestive heart failure) ([place]), COPD (chronic obstructive pulmonary disease) ([place]), DDD (degenerative disc disease), cervical, Deaf, right, Depression, Diabetes mellitus without complication ([place]), GERD (gastroesophageal reflux disease), High cholesterol, History of hiatal hernia, Hypertension, Neuromuscular disorder ([place]), PONV (postoperative nausea and vomiting), Shortness of breath dyspnea, Vertigo, and Wears dentures.  has a past surgical history that includes Cholecystectomy; Appendectomy; Shoulder surgery; Esophagogastroduodenoscopy (N/A, 07/18/2015); Cataract extraction w/PHACO (Right, 03/10/2016); Joint replacement; Back surgery; Abdominal hysterectomy (1963); and Cataract extraction w/PHACO (Left, 04/07/2016). Prior to Admission medications   Medication Sig Start Date End Date Taking? Authorizing Provider  albuterol (PROVENTIL HFA;VENTOLIN HFA) 108 (90 Base) MCG/ACT inhaler Inhale 2 puffs into the lungs every 6 (six) hours as needed for wheezing or shortness of breath. 11/02/17  Yes Alfred Levins, [place], MD  aspirin EC 81 MG tablet Take 81 mg by mouth daily. breakfast   Yes [provider]  clobetasol cream (TEMOVATE) 1.57 % Apply 1 application as needed topically.   Yes [provider]  cyanocobalamin 1000 MCG tablet Take 1,000 mcg by mouth daily. breakfast   Yes [provider]  DULoxetine (CYMBALTA) 20 MG capsule Take 20 mg daily by mouth.   Yes [provider]  furosemide (LASIX) 20 MG tablet Take 20 mg by mouth daily. May take second dose if needed for swelling.   Yes [provider]  gabapentin (NEURONTIN) 400 MG capsule Take  1 capsule (400 mg total) by mouth at bedtime. Patient taking differently: Take 800 mg at bedtime by mouth.  03/15/13  Yes Regal, Tamala Fothergill, DPM  hydrALAZINE (APRESOLINE) 25 MG tablet Take 1 tablet (25 mg total) 3 (three) times daily by mouth. 04/19/17  Yes Sudini, Alveta Heimlich, MD  metoprolol succinate (TOPROL-XL) 25 MG 24 hr tablet Take 25 mg daily by mouth.   Yes [provider]  pantoprazole (PROTONIX) 40 MG tablet Take 40 mg 2 (two) times daily by mouth.    Yes [provider]  simvastatin (ZOCOR) 20 MG tablet Take 20 mg by mouth daily.    Yes [provider]  solifenacin (VESICARE) 10 MG tablet Take 10 mg by mouth daily. breakfast   Yes [provider]  traMADol (ULTRAM) 50 MG tablet Take 50 mg by mouth every 6 (six) hours as needed.    Yes [provider]  lisinopril (PRINIVIL,ZESTRIL) 5 MG tablet Take 1 tablet (5 mg total) by mouth daily. 08/08/16 11/30/17  Delman Kitten, MD   Allergies  Allergen Reactions  . Ciprofloxacin Nausea And Vomiting  . Hydrocodone Nausea Only  . Sertraline Nausea And Vomiting  . Codeine Nausea And Vomiting    FAMILY HISTORY:  family history is not on file. SOCIAL HISTORY:  reports that she quit smoking about 17 years ago. Her smoking use included cigarettes. She has a 5.00 pack-year smoking history. She has never used smokeless tobacco. She reports that she does not drink alcohol or use drugs.  REVIEW OF SYSTEMS:   Positives in BOLD: Gen: Denies fever, chills, weight change, fatigue, night sweats HEENT: Denies blurred vision, double vision, hearing loss, tinnitus, sinus congestion, rhinorrhea, sore throat, neck stiffness, dysphagia PULM: Denies shortness of breath, +cough, sputum production, hemoptysis, wheezing CV:  Denies chest pain, edema, orthopnea, paroxysmal nocturnal dyspnea, palpitations GI: Denies abdominal pain, nausea, vomiting, diarrhea, hematochezia, melena, constipation, change in bowel habits GU: Denies  dysuria, hematuria, polyuria, oliguria, urethral discharge Endocrine: Denies hot or cold intolerance, polyuria, polyphagia or appetite change Derm: Denies rash, dry skin, scaling or peeling skin change Heme: Denies easy bruising, bleeding, bleeding gums Neuro: Denies headache, numbness, weakness, slurred speech, loss of memory or consciousness   VITAL SIGNS: Temp:  [97.8 F (36.6 C)-98.3 F (36.8 C)] 97.8 F (36.6 C) (08/13 0800) Pulse Rate:  [66-108] 102 (08/13 1200) Resp:  [16-32] 22 (08/13 1200) BP: (134-184)/(51-72) 134/51 (08/13 1200) SpO2:  [86 %-99 %] 95 % (08/13 1200) FiO2 (%):  [55 %-80 %] 80 % (08/13 0911) Weight:  [75.1 kg] 75.1 kg (08/13 0432)  Physical Examination:  General: Acute on chronically ill appearing female, sitting in chair, on HFNC, in NAD Neuro: A&Ox4, follows commands, no focal deficits  HEENT:Atraumatic, normocephalic, neck supple, no JVD  Cardiac: RRR, s1s2, no M/R/G, 2+ pulses throughout Pulmonary: Clear bilaterally w/ fine crackles bilateral bases, no wheezing, slight tachypnea post bath, even  GI: Soft, non-distended, nontender, BS+ x4  Musculoskeletal: No deformities, normal bulk and tone  Skin: Warm/dry.  No obvious rashes, lesions, or ulcerations   ASSESSMENT / PLAN:  PULMONARY A: Acute hypoxic respiratory failure in setting of IPF exacerbation, AECOPD, PNA   -CT Chest>>  improved bilateral groundglass opacification and mediastinal adenopathy which was noticed on a previous CAT scan Hx: Pulmonary Fibrosis P: Supplemental O2 to maintain O2 sats 88 to 92% HFNC, wean as tolerated BiPAP prn Continue Bronchodilators Continue Solu-Medrol, will transition to PO Prednisone 60 mg daily on 8/14 Continue Doxycycline (day 2/5) Add Guaifenesin  Follow intermittent CXR Palliative care following, appreciate input  CARDIAC A: CHF Exacerbation HTN -Echo>> EF 65-70%, moderate LV hypertrophy, grade 2 diastolic dysfunction.  RV systolic function  normal.  Pulmonary arteries systolic pressures normal. P: Cardiac monitoring Continue Lisinopril, metoprolol Will resume home Lasix 20 mg PO daily  RENAL A:  AKI in setting of diuresis Hypokalemia>>resolved Mild hypernatremia Mild metabolic alkalosis in setting of diuresis P: Monitor I&O's / urinary output Follow BMP Ensure adequate renal perfusion Avoid nephrotoxic agents as able Replace electrolytes as indicated Encourage PO water intake as pt A&O and able to take PO   ENDOCRINE A: Diabetes Mellitus P: Monitor CBG's SSI Follow ICU Hypo/hyperglycemia protocol   NEURO A: AMS & Metabolic Encephalopathy>>resolved P: Provide supportive care Avoid sedating meds as able Lights on during the day Provide normal wake/sleep cycle Continue home Cymbalta PT consulted, mobilize as able    GOALS OF CARE: DNR/DNI. Confirmed by pt and family 01/23/18. Family: Family updated at bedside 01/24/2018 VTE prophylaxis: Lovenox SUP: Pepcid    Darel Hong, AGACNP-BC [facility] Pager: [phone]

## 2018-01-24 NOTE — Progress Notes (Signed)
PHARMACIST - PHYSICIAN COMMUNICATION  DR:   Alva Garnet  CONCERNING: IV to Oral Route Change Policy  RECOMMENDATION: This patient is receiving famotidine by the intravenous route.  Based on criteria approved by the Pharmacy and Therapeutics Committee, the intravenous medication(s) is/are being converted to the equivalent oral dose form(s).   DESCRIPTION: These criteria include:  The patient is eating (either orally or via tube) and/or has been taking other orally administered medications for a least 24 hours  The patient has no evidence of active gastrointestinal bleeding or impaired GI absorption (gastrectomy, short bowel, patient on TNA or NPO).  If you have questions about this conversion, please contact the Pharmacy Department  []   [phone] )  [facility] []   [phone] )  [facility] []   [phone] )  [facility] []   [phone] )  [facility] []   [phone] )  [place], [facility] 01/24/2018 11:49 AM

## 2018-01-24 NOTE — Progress Notes (Signed)
Pt admitted from CCU without incident per MD order via wheelchair.Prior to transfer, report taken from [facility] in CCU.  Family aware of transfer. Pt assisted from wheelchair to bed one assist. Pt on 10 lpm HFNC. Pt becomes SOB with transfer but recovers quickly. Pt alert and oriented x 3. Pt denies pain. Pt oriented to room and unit routine. Will continue to monitor.

## 2018-01-24 NOTE — Progress Notes (Signed)
Palliative:  I met again today with [name] and [name] and [name] at bedside. [name] is in much better spirits today and is up in recliner. She is very happy to be cleared for intake today. [name] is very anxious throughout our conversation constantly monitoring oxygen saturations on monitor. I tried to explain that this will fluctuate with Kristy talking and coughing and this is to be expected.   [name] is very talkative today and shares how she has stayed connected to Kristy sorority to raise money for [facility] and also shares how she used to volunteer for [employer]. They do confirm that the plan is to optimize Kristy health in preparation to return home with hospice care at home. [name] is familiar with hospice services from Kristy volunteer work. Discussed services with all of them. They seem comfortable with having hospice services when she returns home. [name] main concern is issues they have had with Kristy oxygen equipment at home and I explained that hospice will speak with them regarding home equipment needed prior to discharge from the hospital. All questions/concerns addressed. Emotional support provided.   Exam: Awake, alert, oriented. Sitting in recliner. SOB improved but still present mild-mod with talking or coughing. + non-productive cough.   8 min  Vinie Sill, NP Palliative Medicine Team Pager # [phone] (M-F 8a-5p) Team Phone # [phone] (Nights/Weekends)

## 2018-01-25 ENCOUNTER — Inpatient Hospital Stay: Payer: Medicare Other

## 2018-01-25 LAB — CBC
HCT: 40.1 % (ref 35.0–47.0)
Hemoglobin: 13.1 g/dL (ref 12.0–16.0)
MCH: 26 pg (ref 26.0–34.0)
MCHC: 32.6 g/dL (ref 32.0–36.0)
MCV: 79.8 fL — AB (ref 80.0–100.0)
PLATELETS: 457 10*3/uL — AB (ref 150–440)
RBC: 5.02 MIL/uL (ref 3.80–5.20)
RDW: 17.7 % — AB (ref 11.5–14.5)
WBC: 28.4 10*3/uL — ABNORMAL HIGH (ref 3.6–11.0)

## 2018-01-25 LAB — GLUCOSE, CAPILLARY
GLUCOSE-CAPILLARY: 154 mg/dL — AB (ref 70–99)
Glucose-Capillary: 107 mg/dL — ABNORMAL HIGH (ref 70–99)
Glucose-Capillary: 122 mg/dL — ABNORMAL HIGH (ref 70–99)
Glucose-Capillary: 145 mg/dL — ABNORMAL HIGH (ref 70–99)

## 2018-01-25 LAB — BASIC METABOLIC PANEL
Anion gap: 15 (ref 5–15)
BUN: 53 mg/dL — AB (ref 8–23)
CALCIUM: 9.9 mg/dL (ref 8.9–10.3)
CO2: 26 mmol/L (ref 22–32)
Chloride: 103 mmol/L (ref 98–111)
Creatinine, Ser: 1.09 mg/dL — ABNORMAL HIGH (ref 0.44–1.00)
GFR calc Af Amer: 53 mL/min — ABNORMAL LOW (ref >60)
GFR calc non Af Amer: 46 mL/min — ABNORMAL LOW (ref >60)
GLUCOSE: 245 mg/dL — AB (ref 70–99)
POTASSIUM: 4.3 mmol/L (ref 3.5–5.1)
Sodium: 144 mmol/L (ref 135–145)

## 2018-01-25 LAB — PROCALCITONIN: PROCALCITONIN: 0.29 ng/mL

## 2018-01-25 LAB — BRAIN NATRIURETIC PEPTIDE: B Natriuretic Peptide: 177 pg/mL — ABNORMAL HIGH (ref 0.0–100.0)

## 2018-01-25 NOTE — Care Management Important Message (Signed)
Important Message  Patient Details  Name: [name] MRN: [id number] Date of Birth: Feb 18, 1933   Medicare Important Message Given:  Yes    Juliann Pulse A [name] 01/25/2018, 10:39 AM

## 2018-01-25 NOTE — Progress Notes (Signed)
Entered room at lab request to reposition patient. Patient had pulled her oxygen off. Patient repositioned in bed and oxygen replaced. Oxygen sats 66% on RA. Patient was arousable. Sats came up to 88% after a few minutes of having oxygen back on. Patient resting in bed. Portable pulse ox left on to alert if patient removed oxygen again.

## 2018-01-25 NOTE — Progress Notes (Signed)
Patient ID: [name], female   DOB: [birth year], 84 y.o.   MRN: [id number]  [place] PROGRESS NOTE  [name] GXQ:[id number] DOB: 1932-12-10 DOA: 02/10/2018 PCP: Kirk Ruths, MD  HPI/Subjective: Patient is requiring high flow oxygen states her breathing is improved  Objective: Vitals:   01/25/18 0517 01/25/18 0837  BP:  (!) 114/45  Pulse:  69  Resp:  18  Temp:  97.6 F (36.4 C)  SpO2: (!) 87% (!) 87%    Filed Weights   01/22/18 0500 01/23/18 0754 01/24/18 0432  Weight: 77.7 kg 75.7 kg 75.1 kg    ROS: Review of Systems  Constitutional: Negative for chills and fever.  Eyes: Negative for blurred vision.  Respiratory: Positive for cough and shortness of breath.   Cardiovascular: Negative for chest pain.  Gastrointestinal: Negative for abdominal pain, constipation, diarrhea, nausea and vomiting.  Genitourinary: Negative for dysuria.  Musculoskeletal: Negative for joint pain.  Neurological: Negative for dizziness and headaches.   Exam: Physical Exam  Constitutional: She is oriented to person, place, and time.  HENT:  Nose: No mucosal edema.  Mouth/Throat: No oropharyngeal exudate or posterior oropharyngeal edema.  Eyes: Pupils are equal, round, and reactive to light. Conjunctivae, EOM and lids are normal.  Neck: No JVD present. Carotid bruit is not present. No edema present. No thyroid mass and no thyromegaly present.  Cardiovascular: S1 normal and S2 normal. Exam reveals no gallop.  No murmur heard. Pulses:      Dorsalis pedis pulses are 2+ on the right side, and 2+ on the left side.  Respiratory: Accessory muscle usage present. No respiratory distress. She has no decreased breath sounds. She has no wheezes. She has rhonchi. She has rales.  GI: Soft. Bowel sounds are normal. There is no tenderness.  Musculoskeletal:       Right ankle: She exhibits no swelling.       Left ankle: She exhibits no swelling.  Lymphadenopathy:    She has no cervical  adenopathy.  Neurological: She is alert and oriented to person, place, and time. No cranial nerve deficit.  Skin: Skin is warm. No rash noted. Nails show no clubbing.  Psychiatric: She has a normal mood and affect.      Data Reviewed: Basic Metabolic Panel: Recent Labs  Lab 01/21/18 0530 01/22/18 0606 01/23/18 0433 01/24/18 0433 01/25/18 0333  NA 141 145 146* 147* 144  K 3.9 3.7 3.2* 4.1 4.3  CL 101 99 104 106 103  CO2 31 32 31 34* 26  GLUCOSE 143* 141* 148* 144* 245*  BUN 28* 36* 48* 50* 53*  CREATININE 0.99 1.11* 1.09* 0.98 1.09*  CALCIUM 9.6 9.7 9.5 9.8 9.9  MG  --  2.4 2.5*  --   --   PHOS  --  3.1 2.8  --   --    CBC: Recent Labs  Lab 01/21/18 0530 01/22/18 0606 01/23/18 0433 01/24/18 0433 01/25/18 0333  WBC 37.0* 29.0* 18.8* 18.1* 28.4*  NEUTROABS  --  26.8* 17.4*  --   --   HGB 12.1 12.6 12.3 12.6 13.1  HCT 38.7 40.4 38.4 39.3 40.1  MCV 79.7* 79.3* 79.7* 80.1 79.8*  PLT 334 350 325 352 457*   Cardiac Enzymes: Recent Labs  Lab 02/09/2018 1031  TROPONINI <0.03   BNP (last 3 results) Recent Labs    11/30/17 1628 01/22/2018 1036 01/22/18 0606  BNP 252.0* 88.0 131.0*    CBG: Recent Labs  Lab 01/24/18 1215 01/24/18 1546  01/24/18 2119 01/25/18 0828 01/25/18 1203  GLUCAP 106* 104* 125* 107* 122*    Recent Results (from the past 240 hour(s))  Blood culture (routine x 2)     Status: None   Collection Time: 01/25/2018 11:42 AM  Result Value Ref Range Status   Specimen Description BLOOD BLOOD RIGHT FOREARM  Final   Special Requests   Final    BOTTLES DRAWN AEROBIC AND ANAEROBIC Blood Culture adequate volume   Culture   Final    NO GROWTH 5 DAYS Performed at [facility], [address] [place], [place] [address]    Report Status 01/24/2018 FINAL  Final  Blood culture (routine x 2)     Status: None   Collection Time: 01/17/2018 12:09 PM  Result Value Ref Range Status   Specimen Description BLOOD BLOOD LEFT HAND  Final   Special  Requests   Final    BOTTLES DRAWN AEROBIC AND ANAEROBIC Blood Culture results may not be optimal due to an inadequate volume of blood received in culture bottles   Culture   Final    NO GROWTH 5 DAYS Performed at [facility], [address] [place], [place] [address]    Report Status 01/24/2018 FINAL  Final  MRSA PCR Screening     Status: None   Collection Time: 01/20/18  6:00 AM  Result Value Ref Range Status   MRSA by PCR NEGATIVE NEGATIVE Final    Comment:        The GeneXpert MRSA Assay (FDA approved for NASAL specimens only), is one component of a comprehensive MRSA colonization surveillance program. It is not intended to diagnose MRSA infection nor to guide or monitor treatment for MRSA infections. Performed at [facility] [address] [place], [place] [address]      Studies: Dg Chest 2 View  Result Date: 01/25/2018 CLINICAL DATA:  Pulmonary fibrosis EXAM: CHEST - 2 VIEW COMPARISON:  01/23/2018, 04/18/2017 FINDINGS: Severe chronic interstitial fibrosis as noted on prior studies. Progression of diffuse bilateral airspace disease compared with prior studies. This may represent superimposed edema or infection. Hypoventilation with bibasilar atelectasis. No significant effusion IMPRESSION: Severe interstitial fibrosis. Hypoventilation with bibasilar atelectasis Superimposed bilateral edema/infiltrate. Electronically Signed   By: Franchot Gallo M.D.   On: 01/25/2018 10:00    Scheduled Meds: . aspirin EC  81 mg Oral Daily  . chlorhexidine  15 mL Mouth Rinse BID  . darifenacin  7.5 mg Oral Daily  . doxycycline  100 mg Oral Q12H  . DULoxetine  20 mg Oral Daily  . enoxaparin (LOVENOX) injection  40 mg Subcutaneous Q24H  . famotidine  20 mg Oral Daily  . feeding supplement (ENSURE ENLIVE)  237 mL Oral BID BM  . furosemide  20 mg Oral Daily  . gabapentin  400 mg Oral QHS  . guaiFENesin  600 mg Oral BID  . insulin aspart  0-15 Units Subcutaneous TID WC   . insulin aspart  0-5 Units Subcutaneous QHS  . lisinopril  5 mg Oral Daily  . mouth rinse  15 mL Mouth Rinse q12n4p  . metoprolol tartrate  25 mg Oral BID  . multivitamin with minerals  1 tablet Oral Daily  . predniSONE  60 mg Oral Q breakfast  . simvastatin  20 mg Oral Daily  . cyanocobalamin  1,000 mcg Oral Daily   Continuous Infusions:   Assessment/Plan:  1. Acute hypoxic respiratory failure.  Continue high flow oxygen patient with some improvement 2. Clinical sepsis  secondary to pneumonia.  Continue doxycycline 3. COPD exacerbation continue steroid taper 4. Acute on chronic diastolic congestive heart failure with IHSS.  Continue oral Lasix 5. essential hypertension.  Continue metoprolol, lisinopril and hydralazine 6. GERD on Protonix 7. Hyperlipidemia on Zocor 8. Neuropathy on gabapentin  Code Status:     Code Status Orders  (From admission, onward)         Start     Ordered   01/15/2018 1340  Do not attempt resuscitation (DNR)  Continuous    Question Answer Comment  In the event of cardiac or respiratory ARREST Do not call a "code blue"   In the event of cardiac or respiratory ARREST Do not perform Intubation, CPR, defibrillation or ACLS   In the event of cardiac or respiratory ARREST Use medication by any route, position, wound care, and other measures to relive pain and suffering. May use oxygen, suction and manual treatment of airway obstruction as needed for comfort.   Comments Nurse may pronounce      01/18/2018 1339        Code Status History    Date Active Date Inactive Code Status Order ID Comments User Context   11/30/2017 2022 12/04/2017 1610 Full Code [id number]  Fritzi Mandes, MD Inpatient   04/18/2017 2345 04/19/2017 2040 Full Code [id number]  Dustin Flock, MD Inpatient   02/13/2017 1625 02/17/2017 1517 Full Code [id number]  Nicholes Mango, MD Inpatient    Advance Directive Documentation     Most Recent Value  Type of Advance Directive  Living will   Pre-existing out of facility DNR order (yellow form or pink MOST form)  -  "MOST" Form in Place?  -     Family Communication: Spoke with son on the phone Disposition Plan: Pending Consultants:  Critical care specialist  Antibiotics:  Vancomycin  Cefepime  Time spent: 35 minutes [place]

## 2018-01-26 DIAGNOSIS — J84112 Idiopathic pulmonary fibrosis: Secondary | ICD-10-CM

## 2018-01-26 DIAGNOSIS — R0902 Hypoxemia: Secondary | ICD-10-CM

## 2018-01-26 LAB — BASIC METABOLIC PANEL
ANION GAP: 8 (ref 5–15)
BUN: 48 mg/dL — ABNORMAL HIGH (ref 8–23)
CALCIUM: 9.3 mg/dL (ref 8.9–10.3)
CO2: 31 mmol/L (ref 22–32)
Chloride: 102 mmol/L (ref 98–111)
Creatinine, Ser: 0.88 mg/dL (ref 0.44–1.00)
GFR calc non Af Amer: 59 mL/min — ABNORMAL LOW (ref >60)
GLUCOSE: 122 mg/dL — AB (ref 70–99)
POTASSIUM: 4.1 mmol/L (ref 3.5–5.1)
SODIUM: 141 mmol/L (ref 135–145)

## 2018-01-26 LAB — CBC
HCT: 38.3 % (ref 35.0–47.0)
HEMOGLOBIN: 12.1 g/dL (ref 12.0–16.0)
MCH: 25.1 pg — ABNORMAL LOW (ref 26.0–34.0)
MCHC: 31.6 g/dL — AB (ref 32.0–36.0)
MCV: 79.3 fL — ABNORMAL LOW (ref 80.0–100.0)
Platelets: 308 10*3/uL (ref 150–440)
RBC: 4.83 MIL/uL (ref 3.80–5.20)
RDW: 18.1 % — ABNORMAL HIGH (ref 11.5–14.5)
WBC: 23.2 10*3/uL — AB (ref 3.6–11.0)

## 2018-01-26 LAB — GLUCOSE, CAPILLARY
GLUCOSE-CAPILLARY: 127 mg/dL — AB (ref 70–99)
GLUCOSE-CAPILLARY: 102 mg/dL — AB (ref 70–99)
Glucose-Capillary: 113 mg/dL — ABNORMAL HIGH (ref 70–99)
Glucose-Capillary: 199 mg/dL — ABNORMAL HIGH (ref 70–99)

## 2018-01-26 MED ORDER — LORAZEPAM 2 MG/ML IJ SOLN
0.5000 mg | Freq: Once | INTRAMUSCULAR | Status: AC
  Administered 2018-01-26: 0.5 mg via INTRAVENOUS
  Filled 2018-01-26: qty 1

## 2018-01-26 MED ORDER — MORPHINE SULFATE (PF) 2 MG/ML IV SOLN
1.0000 mg | INTRAVENOUS | Status: DC | PRN
  Administered 2018-01-26 – 2018-01-28 (×8): 2 mg via INTRAVENOUS
  Filled 2018-01-26 (×9): qty 1

## 2018-01-26 MED ORDER — FUROSEMIDE 10 MG/ML IJ SOLN
40.0000 mg | Freq: Every day | INTRAMUSCULAR | Status: DC
  Administered 2018-01-26 – 2018-01-27 (×2): 40 mg via INTRAVENOUS
  Filled 2018-01-26 (×2): qty 4

## 2018-01-26 MED ORDER — SODIUM CHLORIDE 0.9 % IV SOLN
100.0000 mg | Freq: Two times a day (BID) | INTRAVENOUS | Status: DC
  Administered 2018-01-26 (×2): 100 mg via INTRAVENOUS
  Filled 2018-01-26 (×4): qty 100

## 2018-01-26 MED ORDER — ONDANSETRON HCL 4 MG/2ML IJ SOLN
4.0000 mg | Freq: Four times a day (QID) | INTRAMUSCULAR | Status: DC | PRN
Start: 2018-01-26 — End: 2018-01-29

## 2018-01-26 MED ORDER — METHYLPREDNISOLONE SODIUM SUCC 40 MG IJ SOLR
40.0000 mg | Freq: Two times a day (BID) | INTRAMUSCULAR | Status: DC
  Administered 2018-01-26 – 2018-01-27 (×3): 40 mg via INTRAVENOUS
  Filled 2018-01-26 (×3): qty 1

## 2018-01-26 NOTE — Progress Notes (Signed)
Pt anxious. Complains of feeling short of breath. 02 sat 90% on HFNC 99%. Pt requests Morphine. Morphine 2mg  administered IV as ordered for shortness of breath.

## 2018-01-26 NOTE — Progress Notes (Signed)
Patient ID: [name], female   DOB: Oct 24, 1932, 84 y.o.   MRN: [id number]  [name] PROGRESS NOTE  [name] ZGY:[id number] DOB: 1933/04/04 DOA: 01/18/2018 PCP: Kirk Ruths, MD  HPI/Subjective: Patient transferred back to the ICU due to respiratory distress  Objective: Vitals:   01/26/18 1400 01/26/18 1426  BP: (!) 131/54   Pulse: 75 74  Resp: (!) 35 (!) 21  Temp:    SpO2: (!) 89% 90%    Filed Weights   01/22/18 0500 01/23/18 0754 01/24/18 0432  Weight: 77.7 kg 75.7 kg 75.1 kg    ROS: Review of Systems  Constitutional: Negative for chills and fever.  Eyes: Negative for blurred vision.  Respiratory: Positive for cough and shortness of breath.   Cardiovascular: Negative for chest pain.  Gastrointestinal: Negative for abdominal pain, constipation, diarrhea, nausea and vomiting.  Genitourinary: Negative for dysuria.  Musculoskeletal: Negative for joint pain.  Neurological: Negative for dizziness and headaches.   Exam: Physical Exam  Constitutional: She is oriented to person, place, and time.  HENT:  Nose: No mucosal edema.  Mouth/Throat: No oropharyngeal exudate or posterior oropharyngeal edema.  Eyes: Pupils are equal, round, and reactive to light. Conjunctivae, EOM and lids are normal.  Neck: No JVD present. Carotid bruit is not present. No edema present. No thyroid mass and no thyromegaly present.  Cardiovascular: S1 normal and S2 normal. Exam reveals no gallop.  No murmur heard. Pulses:      Dorsalis pedis pulses are 2+ on the right side, and 2+ on the left side.  Respiratory: Accessory muscle usage present. No respiratory distress. She has no decreased breath sounds. She has no wheezes. She has rhonchi. She has rales.  GI: Soft. Bowel sounds are normal. There is no tenderness.  Musculoskeletal:       Right ankle: She exhibits no swelling.       Left ankle: She exhibits no swelling.  Lymphadenopathy:    She has no cervical adenopathy.   Neurological: She is alert and oriented to person, place, and time. No cranial nerve deficit.  Skin: Skin is warm. No rash noted. Nails show no clubbing.  Psychiatric: She has a normal mood and affect.      Data Reviewed: Basic Metabolic Panel: Recent Labs  Lab 01/22/18 0606 01/23/18 0433 01/24/18 0433 01/25/18 0333 01/26/18 0509  NA 145 146* 147* 144 141  K 3.7 3.2* 4.1 4.3 4.1  CL 99 104 106 103 102  CO2 32 31 34* 26 31  GLUCOSE 141* 148* 144* 245* 122*  BUN 36* 48* 50* 53* 48*  CREATININE 1.11* 1.09* 0.98 1.09* 0.88  CALCIUM 9.7 9.5 9.8 9.9 9.3  MG 2.4 2.5*  --   --   --   PHOS 3.1 2.8  --   --   --    CBC: Recent Labs  Lab 01/22/18 0606 01/23/18 0433 01/24/18 0433 01/25/18 0333 01/26/18 0509  WBC 29.0* 18.8* 18.1* 28.4* 23.2*  NEUTROABS 26.8* 17.4*  --   --   --   HGB 12.6 12.3 12.6 13.1 12.1  HCT 40.4 38.4 39.3 40.1 38.3  MCV 79.3* 79.7* 80.1 79.8* 79.3*  PLT 350 325 352 457* 308   Cardiac Enzymes: No results for input(s): CKTOTAL, CKMB, CKMBINDEX, TROPONINI in the last 168 hours. BNP (last 3 results) Recent Labs    01/12/2018 1036 01/22/18 0606 01/25/18 1450  BNP 88.0 131.0* 177.0*    CBG: Recent Labs  Lab 01/25/18 0828 01/25/18 1203 01/25/18  1651 01/25/18 2118 01/26/18 0531  GLUCAP 107* 122* 145* 154* 113*    Recent Results (from the past 240 hour(s))  Blood culture (routine x 2)     Status: None   Collection Time: 02/08/2018 11:42 AM  Result Value Ref Range Status   Specimen Description BLOOD BLOOD RIGHT FOREARM  Final   Special Requests   Final    BOTTLES DRAWN AEROBIC AND ANAEROBIC Blood Culture adequate volume   Culture   Final    NO GROWTH 5 DAYS Performed at [facility], [address] [place], [place] [address]    Report Status 01/24/2018 FINAL  Final  Blood culture (routine x 2)     Status: None   Collection Time: 02/05/2018 12:09 PM  Result Value Ref Range Status   Specimen Description BLOOD BLOOD LEFT HAND  Final    Special Requests   Final    BOTTLES DRAWN AEROBIC AND ANAEROBIC Blood Culture results may not be optimal due to an inadequate volume of blood received in culture bottles   Culture   Final    NO GROWTH 5 DAYS Performed at [facility], [address] [place], [place] [address]    Report Status 01/24/2018 FINAL  Final  MRSA PCR Screening     Status: None   Collection Time: 01/20/18  6:00 AM  Result Value Ref Range Status   MRSA by PCR NEGATIVE NEGATIVE Final    Comment:        The GeneXpert MRSA Assay (FDA approved for NASAL specimens only), is one component of a comprehensive MRSA colonization surveillance program. It is not intended to diagnose MRSA infection nor to guide or monitor treatment for MRSA infections. Performed at [facility], [address] [place], [place] [address]      Studies: Dg Chest 2 View  Result Date: 01/25/2018 CLINICAL DATA:  Pulmonary fibrosis EXAM: CHEST - 2 VIEW COMPARISON:  01/23/2018, 04/18/2017 FINDINGS: Severe chronic interstitial fibrosis as noted on prior studies. Progression of diffuse bilateral airspace disease compared with prior studies. This may represent superimposed edema or infection. Hypoventilation with bibasilar atelectasis. No significant effusion IMPRESSION: Severe interstitial fibrosis. Hypoventilation with bibasilar atelectasis Superimposed bilateral edema/infiltrate. Electronically Signed   By: Franchot Gallo M.D.   On: 01/25/2018 10:00    Scheduled Meds: . aspirin EC  81 mg Oral Daily  . chlorhexidine  15 mL Mouth Rinse BID  . darifenacin  7.5 mg Oral Daily  . DULoxetine  20 mg Oral Daily  . enoxaparin (LOVENOX) injection  40 mg Subcutaneous Q24H  . famotidine  20 mg Oral Daily  . feeding supplement (ENSURE ENLIVE)  237 mL Oral BID BM  . furosemide  40 mg Intravenous Daily  . gabapentin  400 mg Oral QHS  . guaiFENesin  600 mg Oral BID  . insulin aspart  0-15 Units Subcutaneous TID WC  . insulin  aspart  0-5 Units Subcutaneous QHS  . lisinopril  5 mg Oral Daily  . mouth rinse  15 mL Mouth Rinse q12n4p  . methylPREDNISolone (SOLU-MEDROL) injection  40 mg Intravenous Q12H  . metoprolol tartrate  25 mg Oral BID  . multivitamin with minerals  1 tablet Oral Daily  . simvastatin  20 mg Oral Daily  . cyanocobalamin  1,000 mcg Oral Daily   Continuous Infusions: . doxycycline (VIBRAMYCIN) IV Stopped (01/26/18 1225)    Assessment/Plan:  1. Acute hypoxic respiratory failure.  Continue high flow oxygen with significant improvement in condition 2. Clinical  sepsis secondary to pneumonia.  Continue doxycycline 3. COPD exacerbation continue steroid taper 4. Acute on chronic diastolic congestive heart failure with IHSS.  Continue oral Lasix 5. essential hypertension.  Continue metoprolol, lisinopril and hydralazine 6. GERD on Protonix 7. Hyperlipidemia on Zocor 8. Neuropathy on gabapentin   Prognosis very poor I discussed with the patient's daughter and son which should consider making patient comfort care with the goal of symptomatic treatment.  Code Status:     Code Status Orders  (From admission, onward)         Start     Ordered   01/23/2018 1340  Do not attempt resuscitation (DNR)  Continuous    Question Answer Comment  In the event of cardiac or respiratory ARREST Do not call a "code blue"   In the event of cardiac or respiratory ARREST Do not perform Intubation, CPR, defibrillation or ACLS   In the event of cardiac or respiratory ARREST Use medication by any route, position, wound care, and other measures to relive pain and suffering. May use oxygen, suction and manual treatment of airway obstruction as needed for comfort.   Comments Nurse may pronounce      01/13/2018 1339        Code Status History    Date Active Date Inactive Code Status Order ID Comments User Context   11/30/2017 2022 12/04/2017 1610 Full Code [id number]  Fritzi Mandes, MD Inpatient   04/18/2017 2345  04/19/2017 2040 Full Code [id number]  Dustin Flock, MD Inpatient   02/13/2017 1625 02/17/2017 1517 Full Code [id number]  Nicholes Mango, MD Inpatient    Advance Directive Documentation     Most Recent Value  Type of Advance Directive  Living will  Pre-existing out of facility DNR order (yellow form or pink MOST form)  -  "MOST" Form in Place?  -     Family Communication: Spoke with son on the phone Disposition Plan: Pending Consultants:  Critical care specialist  Antibiotics:  Vancomycin  Cefepime  Time spent: 35 minutes [place]

## 2018-01-26 NOTE — Progress Notes (Signed)
Pt sats would not maintain at set goal on HFNC. Pt switched to Optiflow and sats continued to decrease. Pt then placed on Bipap and sats were increased to meet goal.

## 2018-01-26 NOTE — Progress Notes (Signed)
Name: [name] MRN: [id number] DOB: 19-Oct-1932     CONSULTATION DATE: 01/18/2018  Subjective & Objectives: Transferred out to floor 8/13, Transferred back to ICU 8/15 for hypoxia requring BiPAP Currently on HFNC 100% Afebrile Sleeping, arouses to voice Reports she is feeling better than this AM, still experiencing some SOB  PAST MEDICAL HISTORY :   has a past medical history of Anxiety, Arthritis, Cancer ([place]), CHF (congestive heart failure) ([place]), COPD (chronic obstructive pulmonary disease) ([place]), DDD (degenerative disc disease), cervical, Deaf, right, Depression, Diabetes mellitus without complication ([place]), GERD (gastroesophageal reflux disease), High cholesterol, History of hiatal hernia, Hypertension, Neuromuscular disorder ([place]), PONV (postoperative nausea and vomiting), Shortness of breath dyspnea, Vertigo, and Wears dentures.  has a past surgical history that includes Cholecystectomy; Appendectomy; Shoulder surgery; Esophagogastroduodenoscopy (N/A, 07/18/2015); Cataract extraction w/PHACO (Right, 03/10/2016); Joint replacement; Back surgery; Abdominal hysterectomy (1963); and Cataract extraction w/PHACO (Left, 04/07/2016). Prior to Admission medications   Medication Sig Start Date End Date Taking? Authorizing Provider  albuterol (PROVENTIL HFA;VENTOLIN HFA) 108 (90 Base) MCG/ACT inhaler Inhale 2 puffs into the lungs every 6 (six) hours as needed for wheezing or shortness of breath. 11/02/17  Yes Alfred Levins, [place], MD  aspirin EC 81 MG tablet Take 81 mg by mouth daily. breakfast   Yes [provider]  clobetasol cream (TEMOVATE) 1.60 % Apply 1 application as needed topically.   Yes [provider]  cyanocobalamin 1000 MCG tablet Take 1,000 mcg by mouth daily. breakfast   Yes [provider]  DULoxetine (CYMBALTA) 20 MG capsule Take 20 mg daily by mouth.   Yes [provider]  furosemide (LASIX) 20 MG tablet Take 20 mg by mouth daily. May take  second dose if needed for swelling.   Yes [provider]  gabapentin (NEURONTIN) 400 MG capsule Take 1 capsule (400 mg total) by mouth at bedtime. Patient taking differently: Take 800 mg at bedtime by mouth.  03/15/13  Yes Regal, Tamala Fothergill, DPM  hydrALAZINE (APRESOLINE) 25 MG tablet Take 1 tablet (25 mg total) 3 (three) times daily by mouth. 04/19/17  Yes Sudini, Alveta Heimlich, MD  metoprolol succinate (TOPROL-XL) 25 MG 24 hr tablet Take 25 mg daily by mouth.   Yes [provider]  pantoprazole (PROTONIX) 40 MG tablet Take 40 mg 2 (two) times daily by mouth.    Yes [provider]  simvastatin (ZOCOR) 20 MG tablet Take 20 mg by mouth daily.    Yes [provider]  solifenacin (VESICARE) 10 MG tablet Take 10 mg by mouth daily. breakfast   Yes [provider]  traMADol (ULTRAM) 50 MG tablet Take 50 mg by mouth every 6 (six) hours as needed.    Yes [provider]  lisinopril (PRINIVIL,ZESTRIL) 5 MG tablet Take 1 tablet (5 mg total) by mouth daily. 08/08/16 11/30/17  Delman Kitten, MD   Allergies  Allergen Reactions  . Ciprofloxacin Nausea And Vomiting  . Hydrocodone Nausea Only  . Sertraline Nausea And Vomiting  . Codeine Nausea And Vomiting    FAMILY HISTORY:  family history is not on file. SOCIAL HISTORY:  reports that she quit smoking about 17 years ago. Her smoking use included cigarettes. She has a 5.00 pack-year smoking history. She has never used smokeless tobacco. She reports that she does not drink alcohol or use drugs.  REVIEW OF SYSTEMS:   Positives in BOLD: Gen: Denies fever, chills, weight change, fatigue, night sweats HEENT: Denies blurred vision, double vision, hearing loss, tinnitus, sinus congestion,  rhinorrhea, sore throat, neck stiffness, dysphagia PULM: Denies +shortness of breath, +cough, sputum production, hemoptysis, wheezing CV: Denies chest pain, edema, orthopnea, paroxysmal nocturnal dyspnea, palpitations GI: Denies  abdominal pain, nausea, vomiting, diarrhea, hematochezia, melena, constipation, change in bowel habits GU: Denies dysuria, hematuria, polyuria, oliguria, urethral discharge Endocrine: Denies hot or cold intolerance, polyuria, polyphagia or appetite change Derm: Denies rash, dry skin, scaling or peeling skin change Heme: Denies easy bruising, bleeding, bleeding gums Neuro: Denies headache, numbness, weakness, slurred speech, loss of memory or consciousness   VITAL SIGNS: Temp:  [97.4 F (36.3 C)-98.4 F (36.9 C)] 97.4 F (36.3 C) (08/15 0541) Pulse Rate:  [66-82] 81 (08/15 1000) Resp:  [19-29] 19 (08/15 1000) BP: (121-188)/(63-96) 183/85 (08/15 1000) SpO2:  [62 %-95 %] 95 % (08/15 1000) FiO2 (%):  [75 %-99 %] 99 % (08/15 1045)  Physical Examination:  General: Acute on chronically ill appearing female, laying in bed, on 100% HFNC, experiencing mild SOB Neuro: Sleeping, arouses to voice, follows commands, A&O x4, No focal deficits HEENT:Atraumatic, normocephalic, neck supple, no JVD Cardiac: RRR, s1s2, no M/R/G, 2+ pulses throughout Pulmonary: Clear bilaterally with fine crackles bilateral bases, no wheezing, tachypnea, even, slight assessory muscle use  GI: Soft, non-tender, non-distended, BS+ x4  Musculoskeletal: No deformities, normal bulk and tone, swelling RLE (pt and family reports that is pt's baseline)  Skin: Warm/dry.  No obvious rashes, lesions, or ulcerations   ASSESSMENT / PLAN:  PULMONARY A: Acute hypoxic respiratory failure in setting of IPF exacerbation, AECOPD, PNA   -CT Chest>>  improved bilateral groundglass opacification and mediastinal adenopathy which was noticed on a previous CAT scan Hx: Pulmonary Fibrosis P: Supplemental O2 to maintain O2 sats 88 to 92% HFNC, wean as tolerated BiPAP prn Continue scheduled Bronchodilators Continue Solu-Medrol 40 mg q12h Continue Doxycycline  Follow intermittent CXR Trend PCT Palliative care following, appreciate  input   CARDIAC A: CHF Exacerbation HTN -Echo>> EF 65-70%, moderate LV hypertrophy, grade 2 diastolic dysfunction.  RV systolic function normal.  Pulmonary arteries systolic pressures normal. -BNP 177 on 8/14 P: Cardiac monitoring Continue Lasix 40 mg IV daily Continue Lisinopril, metoprolol  INFECTIOUS A: Leukocytosis in setting of PNA P: Monitor fever curve Trend WBCs & PCT Continue Doxycycline   RENAL A:  No active issues P: Monitor I&O's / urinary output Follow BMP Ensure adequate renal perfusion Avoid nephrotoxic agents as able Replace electrolytes as indicated   ENDOCRINE A: Diabetes Mellitus P: Monitor CBG's SSI Follow ICU Hypo/hyperglycemia protocol   NEURO A: No active issues P: Provide supportive care Avoid sedating meds as able Lights on during the day Promote normal wake/sleep cycle Continue home Cymbalta     GOALS OF CARE: DNR/DNI. Confirmed by pt and family 01/23/18. Family: Updated pt and her daughter at bedside 01/26/18. VTE prophylaxis: Lovenox SUP: Pepcid  Prognosis is guarded, pt now has transferred to ICU x2 this admission for BiPAP secondary to Acute Hypoxic respiratory failure.  Pallitive care is following.  Had long discussion with pt's daughter.  Daughter is in favor of continuing current therapies, and is considering not escalating to BiPAP and making her mother comfortable if pt declines further.    Darel Hong, AGACNP-BC [facility] Pager: [phone]

## 2018-01-26 NOTE — Progress Notes (Signed)
Notified MD Duane Boston regarding patient sats not being maintained on HFNC 15L, worsening when patient was changed to Heated High Flow at 40L. Patient now requiring bipap. Patient agreeable to mask.

## 2018-01-26 NOTE — Progress Notes (Signed)
Pt resting calmly in bed. States breathing improved after Morphine.

## 2018-01-26 NOTE — Progress Notes (Signed)
Pt awake, alert and oriented. BiPAP removed and pt placed on HFNC per RT. Will continue to monitor closely.

## 2018-01-26 NOTE — Progress Notes (Signed)
Patient very anxious while wearing the mask. Sats improved but patient tearful and requesting that I call her daughter Keyasia to make her aware that she has been placed back on the mask. I called Izora Gala and notified her of such. Daughter states to call her back should anything change and she will be in early morning. Also contacted MD on call to request medical for anxiety. New ordered entered.

## 2018-01-26 NOTE — Progress Notes (Signed)
Palliative:  I met today at Ms. Cyran's bedside as she rested I had a long discussion with daughter, Alaiya. We discussed her mother's recurrent decline and Hibo has good understanding of poor prognosis with advanced lung disease. Denijah acknowledges that her mother has continued to decline and that each admission has become more and more difficult. We discussed limitations and goals today. Deneka agrees with the following:  Plan:   Do not escalate care to BiPAP (will utilize morphine for dyspnea)  Morphine 2 mg IV prn every 3 hours SOB added to ensure comfort. Dose worked well today but may increase frequency and also dose as required to ensure comfort.   Transition to comfort care with further decline  Continue current therapies with hopes of declining oxygen needs with goal to pursue hospice facility (this would be more reasonable goal at this point as her son would struggle to take her home). Although patient would like to return home if at all possible.   Late entry: I returned to discuss these goals with Ms. Defalco this afternoon and her son and daughter are present. She agrees no more BiPAP. She has utilized one dose of morphine 2 mg IV with good relief and did not become sedated. She agrees with above recommendations but would like to return home if at all possible. She understands that this may not be possible and says she is open to hospice facility if this is believed to be what she needs. She again tells me her only concern is for her children. They had no further questions or concerns. Emotional support provided.   Exam: Alert, oriented, good spirits. Mild-mod labored breathing on 45L HFNC.   71 min  Vinie Sill, NP Palliative Medicine Team Pager # [phone] (M-F 8a-5p) Team Phone # [phone] (Nights/Weekends)

## 2018-01-26 NOTE — Progress Notes (Signed)
On call MD notified, pt unable to be transitioned off of bipap. Orders to transfer placed. Patient currently receiving 100% oxygen via bipap sats 89-90%

## 2018-01-26 NOTE — Progress Notes (Signed)
Pt resting in bed with no acute distress  

## 2018-01-26 NOTE — Progress Notes (Signed)
Notified daughter [place] that patient would be transferring to ICU room 11. She states she will be up to see her once she is settled in.

## 2018-01-27 ENCOUNTER — Inpatient Hospital Stay: Payer: Medicare Other

## 2018-01-27 DIAGNOSIS — R0602 Shortness of breath: Secondary | ICD-10-CM

## 2018-01-27 DIAGNOSIS — Z515 Encounter for palliative care: Secondary | ICD-10-CM

## 2018-01-27 DIAGNOSIS — Z7189 Other specified counseling: Secondary | ICD-10-CM

## 2018-01-27 LAB — BASIC METABOLIC PANEL
Anion gap: 6 (ref 5–15)
BUN: 41 mg/dL — AB (ref 8–23)
CO2: 32 mmol/L (ref 22–32)
CREATININE: 0.86 mg/dL (ref 0.44–1.00)
Calcium: 9.1 mg/dL (ref 8.9–10.3)
Chloride: 104 mmol/L (ref 98–111)
GFR calc Af Amer: >60 mL/min (ref >60)
GFR calc non Af Amer: >60 mL/min (ref >60)
GLUCOSE: 136 mg/dL — AB (ref 70–99)
Potassium: 4.7 mmol/L (ref 3.5–5.1)
Sodium: 142 mmol/L (ref 135–145)

## 2018-01-27 LAB — GLUCOSE, CAPILLARY
Glucose-Capillary: 118 mg/dL — ABNORMAL HIGH (ref 70–99)
Glucose-Capillary: 144 mg/dL — ABNORMAL HIGH (ref 70–99)
Glucose-Capillary: 190 mg/dL — ABNORMAL HIGH (ref 70–99)

## 2018-01-27 LAB — CBC
HEMATOCRIT: 36.5 % (ref 35.0–47.0)
HEMOGLOBIN: 11.6 g/dL — AB (ref 12.0–16.0)
MCH: 25.4 pg — AB (ref 26.0–34.0)
MCHC: 31.8 g/dL — AB (ref 32.0–36.0)
MCV: 79.8 fL — ABNORMAL LOW (ref 80.0–100.0)
Platelets: 252 10*3/uL (ref 150–440)
RBC: 4.57 MIL/uL (ref 3.80–5.20)
RDW: 17.7 % — AB (ref 11.5–14.5)
WBC: 19 10*3/uL — ABNORMAL HIGH (ref 3.6–11.0)

## 2018-01-27 MED ORDER — MORPHINE 100MG IN NS 100ML (1MG/ML) PREMIX INFUSION
1.0000 mg/h | INTRAVENOUS | Status: DC
  Administered 2018-01-27: 2 mg/h via INTRAVENOUS
  Administered 2018-01-29: 02:00:00 3 mg/h via INTRAVENOUS
  Filled 2018-01-27 (×2): qty 100

## 2018-01-27 MED ORDER — GLYCOPYRROLATE 1 MG PO TABS
1.0000 mg | ORAL_TABLET | ORAL | Status: DC | PRN
  Filled 2018-01-27 (×2): qty 1

## 2018-01-27 MED ORDER — GLYCOPYRROLATE 0.2 MG/ML IJ SOLN
0.2000 mg | INTRAMUSCULAR | Status: DC | PRN
  Filled 2018-01-27 (×2): qty 1

## 2018-01-27 MED ORDER — PIPERACILLIN-TAZOBACTAM 3.375 G IVPB
3.3750 g | Freq: Three times a day (TID) | INTRAVENOUS | Status: DC
  Administered 2018-01-27: 3.375 g via INTRAVENOUS
  Filled 2018-01-27: qty 50

## 2018-01-27 MED ORDER — POLYVINYL ALCOHOL 1.4 % OP SOLN
1.0000 [drp] | Freq: Four times a day (QID) | OPHTHALMIC | Status: DC | PRN
  Filled 2018-01-27: qty 15

## 2018-01-27 MED ORDER — MORPHINE BOLUS VIA INFUSION
4.0000 mg | INTRAVENOUS | Status: DC | PRN
  Administered 2018-01-28 (×2): 4 mg via INTRAVENOUS
  Filled 2018-01-27: qty 4

## 2018-01-27 MED ORDER — ACETAMINOPHEN 325 MG PO TABS: 650.0000 mg | ORAL_TABLET | Freq: Four times a day (QID) | ORAL | Status: DC | PRN

## 2018-01-27 MED ORDER — GLYCOPYRROLATE 0.2 MG/ML IJ SOLN
0.2000 mg | INTRAMUSCULAR | Status: DC | PRN
  Administered 2018-01-28: 0.2 mg via INTRAVENOUS
  Filled 2018-01-27 (×2): qty 1

## 2018-01-27 MED ORDER — BIOTENE DRY MOUTH MT LIQD: 15.0000 mL | OROMUCOSAL | Status: DC | PRN

## 2018-01-27 MED ORDER — ACETAMINOPHEN 650 MG RE SUPP: 650.0000 mg | Freq: Four times a day (QID) | RECTAL | Status: DC | PRN

## 2018-01-27 NOTE — Progress Notes (Signed)
Name: [name] MRN: [id number] DOB: 1932/08/26     CONSULTATION DATE: 01/31/2018  Subjective & Objectives: Transferred out to floor 8/13, Transferred back to ICU 8/15 for hypoxia requring BiPAP Presently on BiPAP overnight. Transitioning to he did high flow.  PAST MEDICAL HISTORY :   has a past medical history of Anxiety, Arthritis, Cancer ([place]), CHF (congestive heart failure) ([place]), COPD (chronic obstructive pulmonary disease) ([place]), DDD (degenerative disc disease), cervical, Deaf, right, Depression, Diabetes mellitus without complication ([place]), GERD (gastroesophageal reflux disease), High cholesterol, History of hiatal hernia, Hypertension, Neuromuscular disorder ([place]), PONV (postoperative nausea and vomiting), Shortness of breath dyspnea, Vertigo, and Wears dentures.  has a past surgical history that includes Cholecystectomy; Appendectomy; Shoulder surgery; Esophagogastroduodenoscopy (N/A, 07/18/2015); Cataract extraction w/PHACO (Right, 03/10/2016); Joint replacement; Back surgery; Abdominal hysterectomy (1963); and Cataract extraction w/PHACO (Left, 04/07/2016). Prior to Admission medications   Medication Sig Start Date End Date Taking? Authorizing Provider  albuterol (PROVENTIL HFA;VENTOLIN HFA) 108 (90 Base) MCG/ACT inhaler Inhale 2 puffs into the lungs every 6 (six) hours as needed for wheezing or shortness of breath. 11/02/17  Yes Alfred Levins, [place], MD  aspirin EC 81 MG tablet Take 81 mg by mouth daily. breakfast   Yes [provider]  clobetasol cream (TEMOVATE) 0.34 % Apply 1 application as needed topically.   Yes [provider]  cyanocobalamin 1000 MCG tablet Take 1,000 mcg by mouth daily. breakfast   Yes [provider]  DULoxetine (CYMBALTA) 20 MG capsule Take 20 mg daily by mouth.   Yes [provider]  furosemide (LASIX) 20 MG tablet Take 20 mg by mouth daily. May take second dose if needed for swelling.   Yes [provider]    gabapentin (NEURONTIN) 400 MG capsule Take 1 capsule (400 mg total) by mouth at bedtime. Patient taking differently: Take 800 mg at bedtime by mouth.  03/15/13  Yes Regal, Tamala Fothergill, DPM  hydrALAZINE (APRESOLINE) 25 MG tablet Take 1 tablet (25 mg total) 3 (three) times daily by mouth. 04/19/17  Yes Sudini, Alveta Heimlich, MD  metoprolol succinate (TOPROL-XL) 25 MG 24 hr tablet Take 25 mg daily by mouth.   Yes [provider]  pantoprazole (PROTONIX) 40 MG tablet Take 40 mg 2 (two) times daily by mouth.    Yes [provider]  simvastatin (ZOCOR) 20 MG tablet Take 20 mg by mouth daily.    Yes [provider]  solifenacin (VESICARE) 10 MG tablet Take 10 mg by mouth daily. breakfast   Yes [provider]  traMADol (ULTRAM) 50 MG tablet Take 50 mg by mouth every 6 (six) hours as needed.    Yes [provider]  lisinopril (PRINIVIL,ZESTRIL) 5 MG tablet Take 1 tablet (5 mg total) by mouth daily. 08/08/16 11/30/17  Delman Kitten, MD   Allergies  Allergen Reactions  . Ciprofloxacin Nausea And Vomiting  . Hydrocodone Nausea Only  . Sertraline Nausea And Vomiting  . Codeine Nausea And Vomiting   VITAL SIGNS: Temp:  [97.6 F (36.4 C)-97.9 F (36.6 C)] 97.9 F (36.6 C) (08/15 2000) Pulse Rate:  [65-90] 65 (08/16 0800) Resp:  [16-35] 17 (08/16 0800) BP: (121-204)/(48-85) 138/76 (08/16 0800) SpO2:  [78 %-95 %] 93 % (08/16 0800) FiO2 (%):  [95 %-99 %] 95 % (08/15 1955)  Physical Examination:  General:  Acute on chronically ill appearing female, laying in bed, on 100% Bipap HEENT  :Atraumatic, normocephalic, neck supple, no JVD Cardiac: RRR, s1s2, no M/R/G, 2+ pulses throughout Pulmonary:  Patient with diffuse basilar crackles appreciated Musculoskeletal:  No deformities, normal bulk and tone, swelling RLE (pt and family reports that is pt's baseline)  Skin: Warm/dry.   No obvious rashes, lesions, or ulcerations Neurologic: Patient responds to questioning on  BiPAP  ASSESSMENT / PLAN:  Acute hypoxic respiratory failure in setting of IPF exacerbation, AECOPD, PNA    Hx: Pulmonary Fibrosis P: Supplemental O2 to maintain O2 sats 88 to 92% HFNC, wean as tolerated BiPAP prn Continue scheduled Bronchodilators Continue Solu-Medrol 40 mg q12h Continue Doxycycline  Follow intermittent CXR Trend PCT Palliative care following, appreciate input  CHF Exacerbation. EF 65-70%, moderate LV hypertrophy, grade 2 diastolic dysfunction.  RV systolic function normal.  Pulmonary arteries systolic pressures normal. Continue Lasix 40 mg IV daily Continue Lisinopril, metoprolol  Possible pneumonia. Will broaden antibiotic coverage  Diabetes Mellitus. Sliding scale coverage  Prerenal indices  Leukocytosis. Will broaden antibiotic coverage   GOALS OF CARE: DNR/DNI. Confirmed by pt and family 01/23/18. Family: Updated pt and her daughter at bedside 01/26/18. VTE prophylaxis: Lovenox SUP: Pepcid  Kristy Dellen, DO    Patient ID: [name], female   DOB: 1932/11/27, 84 y.o.   MRN: [id number]

## 2018-01-27 NOTE — Progress Notes (Signed)
Palliative:  I met today at [name]'s bedside and had a discussion with her youngest daughter. Her daughter tells me she has not been present for other palliative care meetings and asked specific questions about diagnosis and prognosis. Specifically talked about pulmonary fibrosis and dependence on high amounts of oxygen. Discussed her mothers desire to focus on her comfort. She tells me her only wish is that her mother not suffer. She is very tearful throughout conversation. Tells me she is shocked by our conversation and was not aware of the severity of her mother's illness.   Noted that patient required bipap overnight. Discussed this with her and she tells me she does not want to wear bipap anymore. We discussed using medications to treat shortness of breath instead of escalating oxygen - patient and daughter agree. Patient tells me the current dose of morphine has been effective and requests another dose from RN. We discussed limitations and goals. Discussed plan with RN. Plan remains the same as yesterday:  Plan:   Do not escalate care to BiPAP (will utilize morphine for dyspnea)  Morphine 2 mg IV prn every 3 hours SOB to ensure comfort. Dose is working well but may increase frequency and dose as required to ensure comfort.   Transition to comfort care with further decline  Continue current therapies with hopes of declining oxygen needs with goal to pursue hospice facility (this would be more reasonable goal at this point as her son would struggle to take her home). Although patient would like to return home if at all possible.   They had no further questions or concerns. Emotional support provided.   Exam: Alert, oriented, good spirits. Mild-mod labored breathing on 45L HFNC.   25 min  Juel Burrow, DNP, [facility] Palliative Medicine Team Team Phone # [phone]  Pager # [phone]

## 2018-01-27 NOTE — Progress Notes (Signed)
Pharmacy Antibiotic Note  [name] is a 84 y.o. female admitted on 01/28/2018 with pneumonia.  Pharmacy has been consulted for Zosyn dosing. Pertinent PMH includes COPD, pulmonary fibrosis, and CHF. Patient was transferred out to floor 8/13 and transferred back to ICU 8/15 for hypoxia requiring BiPAP. Also treating for clinical sepsis secondary to pneumonia.  Plan: Discontinue doxycycline treatment. Start Zosyn 3.375g IV q 8 h.  Height: 5\' 4"  (162.6 cm) Weight: 165 lb 9.1 oz (75.1 kg) IBW/kg (Calculated) : 54.7  Temp (24hrs), Avg:97.9 F (36.6 C), Min:97.8 F (36.6 C), Max:97.9 F (36.6 C)  Recent Labs  Lab 01/23/18 0433 01/24/18 0433 01/25/18 0333 01/26/18 0509 01/27/18 0559  WBC 18.8* 18.1* 28.4* 23.2* 19.0*  CREATININE 1.09* 0.98 1.09* 0.88 0.86    Estimated Creatinine Clearance: 49.2 mL/min (by C-G formula based on SCr of 0.86 mg/dL).    Allergies  Allergen Reactions  . Ciprofloxacin Nausea And Vomiting  . Hydrocodone Nausea Only  . Sertraline Nausea And Vomiting  . Codeine Nausea And Vomiting    Antimicrobials this admission: Vancomycin 8/8 >> 8/9 cefepime 8/8 >> 8/12 doxycycline 8/12 >> 8/16 Zosyn 8/16 >>  Microbiology results: 8/13 BCx: no growth 8/9 MRSA PCR: negative  Thank you for allowing pharmacy to be a part of this patient's care.  Lavena Bullion PharmD student 01/27/2018 11:07 AM

## 2018-01-27 NOTE — Progress Notes (Signed)
Pt 02 saturation dropped to 75-77 at 0250 and on assessment Pt was tachypnic with labored breathing and dyspnea accompanied by anxiety. Pt had requested not to be put back on Bipap at the beginning of shift but agreed with suggestion to be placed back on BiPAP until respiratory status improved. 2 MG of morphine administered as well. Pt currently on 100% bipap and is resting calmly with 02 sat at 88%. Will continue to monitor.

## 2018-01-27 NOTE — Progress Notes (Signed)
Patient ID: [name], female   DOB: 04/26/1933, 84 y.o.   MRN: [id number]  [place] PROGRESS NOTE  [name] FTD:[id number] DOB: [birth year] DOA: 01/20/2018 PCP: Kristy Ruths, MD  HPI/Subjective: The patient continues to be short of breath on high flow oxygen  Objective: Vitals:   01/27/18 1300 01/27/18 1400  BP: (!) 140/47 (!) 154/52  Pulse: 68 71  Resp: (!) 22 (!) 23  Temp:  98 F (36.7 C)  SpO2: (!) 86% (!) 86%    Filed Weights   01/22/18 0500 01/23/18 0754 01/24/18 0432  Weight: 77.7 kg 75.7 kg 75.1 kg    ROS: Review of Systems  Constitutional: Negative for chills and fever.  Eyes: Negative for blurred vision.  Respiratory: Positive for cough and shortness of breath.   Cardiovascular: Negative for chest pain.  Gastrointestinal: Negative for abdominal pain, constipation, diarrhea, nausea and vomiting.  Genitourinary: Negative for dysuria.  Musculoskeletal: Negative for joint pain.  Neurological: Negative for dizziness and headaches.   Exam: Physical Exam  Constitutional: She is oriented to person, place, and time.  HENT:  Nose: No mucosal edema.  Mouth/Throat: No oropharyngeal exudate or posterior oropharyngeal edema.  Eyes: Pupils are equal, round, and reactive to light. Conjunctivae, EOM and lids are normal.  Neck: No JVD present. Carotid bruit is not present. No edema present. No thyroid mass and no thyromegaly present.  Cardiovascular: S1 normal and S2 normal. Exam reveals no gallop.  No murmur heard. Pulses:      Dorsalis pedis pulses are 2+ on the right side, and 2+ on the left side.  Respiratory: Accessory muscle usage present. No respiratory distress. She has no decreased breath sounds. She has no wheezes. She has rhonchi. She has rales.  GI: Soft. Bowel sounds are normal. There is no tenderness.  Musculoskeletal:       Right ankle: She exhibits no swelling.       Left ankle: She exhibits no swelling.  Lymphadenopathy:    She  has no cervical adenopathy.  Neurological: She is alert and oriented to person, place, and time. No cranial nerve deficit.  Skin: Skin is warm. No rash noted. Nails show no clubbing.  Psychiatric: She has a normal mood and affect.      Data Reviewed: Basic Metabolic Panel: Recent Labs  Lab 01/22/18 0606 01/23/18 0433 01/24/18 0433 01/25/18 0333 01/26/18 0509 01/27/18 0559  NA 145 146* 147* 144 141 142  K 3.7 3.2* 4.1 4.3 4.1 4.7  CL 99 104 106 103 102 104  CO2 32 31 34* 26 31 32  GLUCOSE 141* 148* 144* 245* 122* 136*  BUN 36* 48* 50* 53* 48* 41*  CREATININE 1.11* 1.09* 0.98 1.09* 0.88 0.86  CALCIUM 9.7 9.5 9.8 9.9 9.3 9.1  MG 2.4 2.5*  --   --   --   --   PHOS 3.1 2.8  --   --   --   --    CBC: Recent Labs  Lab 01/22/18 0606 01/23/18 0433 01/24/18 0433 01/25/18 0333 01/26/18 0509 01/27/18 0559  WBC 29.0* 18.8* 18.1* 28.4* 23.2* 19.0*  NEUTROABS 26.8* 17.4*  --   --   --   --   HGB 12.6 12.3 12.6 13.1 12.1 11.6*  HCT 40.4 38.4 39.3 40.1 38.3 36.5  MCV 79.3* 79.7* 80.1 79.8* 79.3* 79.8*  PLT 350 325 352 457* 308 252   Cardiac Enzymes: No results for input(s): CKTOTAL, CKMB, CKMBINDEX, TROPONINI in the last 168 hours.  BNP (last 3 results) Recent Labs    02/06/2018 1036 01/22/18 0606 01/25/18 1450  BNP 88.0 131.0* 177.0*    CBG: Recent Labs  Lab 01/26/18 1151 01/26/18 1634 01/26/18 2113 01/27/18 0717 01/27/18 1148  GLUCAP 199* 127* 102* 118* 144*    Recent Results (from the past 240 hour(s))  Blood culture (routine x 2)     Status: None   Collection Time: 01/18/2018 11:42 AM  Result Value Ref Range Status   Specimen Description BLOOD BLOOD RIGHT FOREARM  Final   Special Requests   Final    BOTTLES DRAWN AEROBIC AND ANAEROBIC Blood Culture adequate volume   Culture   Final    NO GROWTH 5 DAYS Performed at [facility], [address] [place], [place] [address]    Report Status 01/24/2018 FINAL  Final  Blood culture (routine x 2)      Status: None   Collection Time: 01/28/2018 12:09 PM  Result Value Ref Range Status   Specimen Description BLOOD BLOOD LEFT HAND  Final   Special Requests   Final    BOTTLES DRAWN AEROBIC AND ANAEROBIC Blood Culture results may not be optimal due to an inadequate volume of blood received in culture bottles   Culture   Final    NO GROWTH 5 DAYS Performed at [facility], [address] [place], [place] [address]    Report Status 01/24/2018 FINAL  Final  MRSA PCR Screening     Status: None   Collection Time: 01/20/18  6:00 AM  Result Value Ref Range Status   MRSA by PCR NEGATIVE NEGATIVE Final    Comment:        The GeneXpert MRSA Assay (FDA approved for NASAL specimens only), is one component of a comprehensive MRSA colonization surveillance program. It is not intended to diagnose MRSA infection nor to guide or monitor treatment for MRSA infections. Performed at [facility], [address] [place], [place] [address]      Studies: Dg Chest [facility] 1 View  Result Date: 01/27/2018 CLINICAL DATA:  Respiratory failure EXAM: PORTABLE CHEST 1 VIEW COMPARISON:  01/25/2018 FINDINGS: Cardiac shadow is stable. Diffuse bilateral infiltrates are again identified and stable. No new focal abnormality is seen. No bony abnormality is noted. IMPRESSION: Stable appearance of the chest from the previous exam. Electronically Signed   By: Inez Catalina M.D.   On: 01/27/2018 02:55    Scheduled Meds: . aspirin EC  81 mg Oral Daily  . chlorhexidine  15 mL Mouth Rinse BID  . darifenacin  7.5 mg Oral Daily  . DULoxetine  20 mg Oral Daily  . enoxaparin (LOVENOX) injection  40 mg Subcutaneous Q24H  . famotidine  20 mg Oral Daily  . feeding supplement (ENSURE ENLIVE)  237 mL Oral BID BM  . furosemide  40 mg Intravenous Daily  . gabapentin  400 mg Oral QHS  . guaiFENesin  600 mg Oral BID  . insulin aspart  0-15 Units Subcutaneous TID WC  . insulin aspart  0-5 Units Subcutaneous QHS  .  lisinopril  5 mg Oral Daily  . mouth rinse  15 mL Mouth Rinse q12n4p  . methylPREDNISolone (SOLU-MEDROL) injection  40 mg Intravenous Q12H  . metoprolol tartrate  25 mg Oral BID  . multivitamin with minerals  1 tablet Oral Daily  . simvastatin  20 mg Oral Daily  . cyanocobalamin  1,000 mcg Oral Daily   Continuous Infusions: . piperacillin-tazobactam (ZOSYN)  IV 3.375 g (  01/27/18 1346)    Assessment/Plan:  1. Acute hypoxic respiratory failure.  Continue high flow oxygen with no significant improvement in condition, discussed with the family regarding were overall poor prognosis.  Do not expect this patient to improve too much. 2. Clinical sepsis secondary to pneumonia.  Continue doxycycline 3. COPD exacerbation continue steroid taper 4. Acute on chronic diastolic congestive heart failure with IHSS.  Continue oral Lasix 5. essential hypertension.  Continue metoprolol, lisinopril and hydralazine 6. GERD on Protonix 7. Hyperlipidemia on Zocor 8. Neuropathy on gabapentin   Prognosis very poor I discussed with the patient's daughter and son which should consider making patient comfort care with the goal of symptomatic treatment.  Code Status:     Code Status Orders  (From admission, onward)         Start     Ordered   02/04/2018 1340  Do not attempt resuscitation (DNR)  Continuous    Question Answer Comment  In the event of cardiac or respiratory ARREST Do not call a "code blue"   In the event of cardiac or respiratory ARREST Do not perform Intubation, CPR, defibrillation or ACLS   In the event of cardiac or respiratory ARREST Use medication by any route, position, wound care, and other measures to relive pain and suffering. May use oxygen, suction and manual treatment of airway obstruction as needed for comfort.   Comments Nurse may pronounce      01/17/2018 1339        Code Status History    Date Active Date Inactive Code Status Order ID Comments User Context   11/30/2017 2022  12/04/2017 1610 Full Code [id number]  Fritzi Mandes, MD Inpatient   04/18/2017 2345 04/19/2017 2040 Full Code [id number]  Dustin Flock, MD Inpatient   02/13/2017 1625 02/17/2017 1517 Full Code [id number]  Nicholes Mango, MD Inpatient    Advance Directive Documentation     Most Recent Value  Type of Advance Directive  Living will  Pre-existing out of facility DNR order (yellow form or pink MOST form)  -  "MOST" Form in Place?  -     Family Communication: Spoke with son on the phone Disposition Plan: Pending Consultants:  Critical care specialist  Antibiotics:  Vancomycin  Cefepime  Time spent: 35 minutes [place]

## 2018-01-27 NOTE — Progress Notes (Signed)
Pt found to be tachypnic with labored breathing and 02 saturations in the 60s. Pt no longer wants to be put on BiPAP and requested more morphine. Harless Nakayama NP was updated on situation and Pt was placed on comfort care measures with morphine drip. This nurse discussed transitioning to comfort care with Pt and she was accepting and did not have any questions. Pt currently resting comfortably on morphine drip with family at the bedside.

## 2018-01-27 NOTE — Progress Notes (Signed)
Pharmacy Electrolyte Monitoring Consult:  Pharmacy consulted to assist in monitoring and replacing electrolytes in this 84 y.o. female admitted on 01/18/2018 with Shortness of Breath   Labs:  Sodium (mmol/L)  Date Value  01/27/2018 142  04/16/2014 143   Potassium (mmol/L)  Date Value  01/27/2018 4.7  04/16/2014 4.1   Magnesium (mg/dL)  Date Value  01/23/2018 2.5 (H)   Phosphorus (mg/dL)  Date Value  01/23/2018 2.8   Calcium (mg/dL)  Date Value  01/27/2018 9.1   Calcium, Total (mg/dL)  Date Value  04/16/2014 8.6   Albumin (g/dL)  Date Value  01/23/2018 3.4 (L)  07/07/2011 3.3 (L)    Assessment/Plan: Patient continued on furosemide 40mg  IV Daily. No replacement warranted. Will order electrolytes and follow along. Goal potassium > 3.5.    Pharmacy will continue to monitor and adjust per consult.    Simpson,Michael L 01/27/2018 7:38 PM

## 2018-01-28 LAB — BASIC METABOLIC PANEL
ANION GAP: 7 (ref 5–15)
BUN: 47 mg/dL — ABNORMAL HIGH (ref 8–23)
CALCIUM: 9.2 mg/dL (ref 8.9–10.3)
CO2: 33 mmol/L — AB (ref 22–32)
CREATININE: 1.04 mg/dL — AB (ref 0.44–1.00)
Chloride: 100 mmol/L (ref 98–111)
GFR calc non Af Amer: 48 mL/min — ABNORMAL LOW (ref >60)
GFR, EST AFRICAN AMERICAN: 56 mL/min — AB (ref >60)
Glucose, Bld: 115 mg/dL — ABNORMAL HIGH (ref 70–99)
Potassium: 4.4 mmol/L (ref 3.5–5.1)
SODIUM: 140 mmol/L (ref 135–145)

## 2018-01-28 NOTE — Progress Notes (Signed)
Pt asleep, easily aroused. On High flow, A&O. Pt denies pain at this time. Pt is comfort care on morphine drip 2mg /hr.

## 2018-01-28 NOTE — Progress Notes (Signed)
Patient ID: [name], female   DOB: Dec 06, 1932, 84 y.o.   MRN: [id number]  [name] IRJ:[id number] DOB: 05-20-1933 DOA: 02/11/2018 PCP: Kirk Ruths, MD  HPI/Subjective:  overnight worsening of shortness of breath, ICU physician spoke with family and patient is on comfort care measures at this time.  Started on morphine drip.  Patient is seen in the intensive care unit,.  She is alert, denies any complaints except shortness of breath Objective: Vitals:   01/27/18 2300 01/28/18 0700  BP:    Pulse: 80 68  Resp: (!) 24 19  Temp:    SpO2: (!) 88% (!) 89%    Filed Weights   01/22/18 0500 01/23/18 0754 01/24/18 0432  Weight: 77.7 kg 75.7 kg 75.1 kg    ROS: Review of Systems  Constitutional: Negative for chills and fever.  Eyes: Negative for blurred vision.  Respiratory: Positive for cough and shortness of breath.   Cardiovascular: Negative for chest pain.  Gastrointestinal: Negative for abdominal pain, constipation, diarrhea, nausea and vomiting.  Genitourinary: Negative for dysuria.  Musculoskeletal: Negative for joint pain.  Neurological: Negative for dizziness and headaches.   Exam: Physical Exam  Constitutional: She is oriented to person, place, and time.  HENT:  Nose: No mucosal edema.  Mouth/Throat: No oropharyngeal exudate or posterior oropharyngeal edema.  Eyes: Pupils are equal, round, and reactive to light. Conjunctivae, EOM and lids are normal.  Neck: No JVD present. Carotid bruit is not present. No edema present. No thyroid mass and no thyromegaly present.  Cardiovascular: S1 normal and S2 normal. Exam reveals no gallop.  No murmur heard. Pulses:      Dorsalis pedis pulses are 2+ on the right side, and 2+ on the left side.  Respiratory: Accessory muscle usage present. No respiratory distress. She has no decreased breath sounds. She has no wheezes. She has rhonchi. She has rales.  GI: Soft. Bowel sounds are normal.  There is no tenderness.  Musculoskeletal:       Right ankle: She exhibits no swelling.       Left ankle: She exhibits no swelling.  Lymphadenopathy:    She has no cervical adenopathy.  Neurological: She is alert and oriented to person, place, and time. No cranial nerve deficit.  Skin: Skin is warm. No rash noted. Nails show no clubbing.  Psychiatric: She has a normal mood and affect.      Data Reviewed: Basic Metabolic Panel: Recent Labs  Lab 01/22/18 0606 01/23/18 0433 01/24/18 0433 01/25/18 0333 01/26/18 0509 01/27/18 0559 01/28/18 0508  NA 145 146* 147* 144 141 142 140  K 3.7 3.2* 4.1 4.3 4.1 4.7 4.4  CL 99 104 106 103 102 104 100  CO2 32 31 34* 26 31 32 33*  GLUCOSE 141* 148* 144* 245* 122* 136* 115*  BUN 36* 48* 50* 53* 48* 41* 47*  CREATININE 1.11* 1.09* 0.98 1.09* 0.88 0.86 1.04*  CALCIUM 9.7 9.5 9.8 9.9 9.3 9.1 9.2  MG 2.4 2.5*  --   --   --   --   --   PHOS 3.1 2.8  --   --   --   --   --    CBC: Recent Labs  Lab 01/22/18 0606 01/23/18 0433 01/24/18 0433 01/25/18 0333 01/26/18 0509 01/27/18 0559  WBC 29.0* 18.8* 18.1* 28.4* 23.2* 19.0*  NEUTROABS 26.8* 17.4*  --   --   --   --   HGB 12.6 12.3 12.6  13.1 12.1 11.6*  HCT 40.4 38.4 39.3 40.1 38.3 36.5  MCV 79.3* 79.7* 80.1 79.8* 79.3* 79.8*  PLT 350 325 352 457* 308 252   Cardiac Enzymes: No results for input(s): CKTOTAL, CKMB, CKMBINDEX, TROPONINI in the last 168 hours. BNP (last 3 results) Recent Labs    01/16/2018 1036 01/22/18 0606 01/25/18 1450  BNP 88.0 131.0* 177.0*    CBG: Recent Labs  Lab 01/26/18 1634 01/26/18 2113 01/27/18 0717 01/27/18 1148 01/27/18 1623  GLUCAP 127* 102* 118* 144* 190*    Recent Results (from the past 240 hour(s))  Blood culture (routine x 2)     Status: None   Collection Time: 01/18/2018 11:42 AM  Result Value Ref Range Status   Specimen Description BLOOD BLOOD RIGHT FOREARM  Final   Special Requests   Final    BOTTLES DRAWN AEROBIC AND ANAEROBIC Blood  Culture adequate volume   Culture   Final    NO GROWTH 5 DAYS Performed at [facility], [address] [place], [place] [address]    Report Status 01/24/2018 FINAL  Final  Blood culture (routine x 2)     Status: None   Collection Time: 01/18/2018 12:09 PM  Result Value Ref Range Status   Specimen Description BLOOD BLOOD LEFT HAND  Final   Special Requests   Final    BOTTLES DRAWN AEROBIC AND ANAEROBIC Blood Culture results may not be optimal due to an inadequate volume of blood received in culture bottles   Culture   Final    NO GROWTH 5 DAYS Performed at [facility], [address] [place], [place] [address]    Report Status 01/24/2018 FINAL  Final  MRSA PCR Screening     Status: None   Collection Time: 01/20/18  6:00 AM  Result Value Ref Range Status   MRSA by PCR NEGATIVE NEGATIVE Final    Comment:        The GeneXpert MRSA Assay (FDA approved for NASAL specimens only), is one component of a comprehensive MRSA colonization surveillance program. It is not intended to diagnose MRSA infection nor to guide or monitor treatment for MRSA infections. Performed at [facility], [address] [place], [place] [address]      Studies: Dg Chest [facility] 1 View  Result Date: 01/27/2018 CLINICAL DATA:  Respiratory failure EXAM: PORTABLE CHEST 1 VIEW COMPARISON:  01/25/2018 FINDINGS: Cardiac shadow is stable. Diffuse bilateral infiltrates are again identified and stable. No new focal abnormality is seen. No bony abnormality is noted. IMPRESSION: Stable appearance of the chest from the previous exam. Electronically Signed   By: Inez Catalina M.D.   On: 01/27/2018 02:55    Scheduled Meds: . chlorhexidine  15 mL Mouth Rinse BID  . famotidine  20 mg Oral Daily  . feeding supplement (ENSURE ENLIVE)  237 mL Oral BID BM  . gabapentin  400 mg Oral QHS  . guaiFENesin  600 mg Oral BID  . mouth rinse  15 mL Mouth Rinse q12n4p   Continuous Infusions: . morphine  2 mg/hr (01/27/18 2220)    Assessment/Plan:  1. Acute hypoxic respiratory failure.  Continue high flow oxygen with no significant improvement in condition, discussed with the family regarding were overall poor prognosis.  And is on comfort care at this time on morphine drip.  Likely transfer to the floor today.   2. Clinical sepsis secondary to pneumonia.  disContinue the antibiotics diuretics,  main focus is  on comfort care with [place],  morphine.    Prognosis is poor, CODE STATUS DNR.  Code Status:     Code Status Orders  (From admission, onward)         Start     Ordered   01/25/2018 1340  Do not attempt resuscitation (DNR)  Continuous    Question Answer Comment  In the event of cardiac or respiratory ARREST Do not call a "code blue"   In the event of cardiac or respiratory ARREST Do not perform Intubation, CPR, defibrillation or ACLS   In the event of cardiac or respiratory ARREST Use medication by any route, position, wound care, and other measures to relive pain and suffering. May use oxygen, suction and manual treatment of airway obstruction as needed for comfort.   Comments Nurse may pronounce      01/15/2018 1339        Code Status History    Date Active Date Inactive Code Status Order ID Comments User Context   11/30/2017 2022 12/04/2017 1610 Full Code [id number]  Fritzi Mandes, MD Inpatient   04/18/2017 2345 04/19/2017 2040 Full Code [id number]  Dustin Flock, MD Inpatient   02/13/2017 1625 02/17/2017 1517 Full Code [id number]  Nicholes Mango, MD Inpatient    Advance Directive Documentation     Most Recent Value  Type of Advance Directive  Living will  Pre-existing out of facility DNR order (yellow form or pink MOST form)  -  "MOST" Form in Place?  -     Family Communication: Disposition Plan: Pending Consultants:  Critical care specialist  Antibiotics: Received vancomycin, cefepime. Time spent: 35 minutes [place]

## 2018-01-28 NOTE — Progress Notes (Signed)
Chaplain was page patient transition. Chaplain went to see patient family was called but had not arrived. Chaplain waited for family.

## 2018-01-28 NOTE — Progress Notes (Signed)
Patient ID: LESSLY STIGLER, female   DOB: 1932/09/09, 84 y.o.   MRN: [id number] Pulmonary/critical care attending  Patient had deterioration in her respiratory status overnight. Did not wish going back on the BiPAP. Family discussion and patient discussion patient has been transitioned to comfort care.presently on a morphine infusion,as needed glycopyrrolate. Patient is resting comfortably. We'll transfer to floor comfort care   Hermelinda Dellen, D.O.

## 2018-01-28 NOTE — Progress Notes (Signed)
Report given to Caryl Pina, [place]. Pt transferred with RN and Nurse Tech on Non-rebreather 100% Per RRT.

## 2018-01-28 NOTE — Progress Notes (Signed)
Pt transferred to 109 on 100% NRB.  Patient request to stay on mask at this time.  RN in room with this RT at time of pt request.  RN states she will reassess later.  [place] left at bedside in pt's room.

## 2018-01-28 NOTE — Progress Notes (Signed)
Chaplain received OR for patient, she was at end of life. Chaplain received page that family needed encouragement. Chaplain went to room and introduce herself to patient and family.Chaplain gave encouragement and support. Chaplain offer prayer to patient and family and the kindly received it. Chaplain to family to have nurse page her if they need her. Family said thank you.

## 2018-02-01 ENCOUNTER — Telehealth: Payer: Self-pay

## 2018-02-01 NOTE — Telephone Encounter (Signed)
Placed in DS' folder to be filled out.

## 2018-02-01 NOTE — Telephone Encounter (Signed)
Recieved Death Certificate from __[employer]________ Delivered/Placed _________nurse box ___

## 2018-02-02 NOTE — Telephone Encounter (Signed)
Funeral home informed death cert is ready for pick up. 

## 2018-02-12 NOTE — Progress Notes (Addendum)
Pnt expired with family in the room at 0329. Notified MD/Hospitalist Arta Silence , [facility], donor services(Ref # [id number]), and Chaplain.   Pnt is not a candidate for donation.

## 2018-02-12 NOTE — Discharge Summary (Addendum)
    Death Note please see Last Note for all details.   In breif -84 year old female patient admitted for acute on chronic hypoxic respiratory failure with clinical sepsis, COPD exacerbation, acute on chronic diastolic heart failure, initially admitted to floor bed but then moved to intensive care unit because of worsening shortness of breath, patient received IV antibiotics, steroids, bronchodilators, IV Lasix, continued on high flow nasal cannula her respiratory condition did not improve despite aggressive treatment, required BiPAP, transition to comfort care after discussing with family members, started on morphine drip.  Patient pronounced dead at 3.29 AM on 2023/02/10.    [name] [id number]:[id number] is a 84 y.o. female, Outpatient Primary MD for the patient is Kirk Ruths, MD  Pronounced dead by registered nurse Ms. Oct 20, 2022 on      @    3:29 AM on Feb 10, 2023.               Cause of death  Cardiopulmonary arrest Advanced lung disease Diabetes mellitus type 2, Total clinical and documentation time for today Under 30 minutes   Last Note; refer to my last note on on the 17th.  Patient expired on 02/10/23 early morning. Time spent more than 30 minutes.

## 2018-02-12 DEATH — deceased

## 2019-10-03 IMAGING — DX DG CHEST 1V PORT
1 series · 1 of 1 positions shown · non-contrast
Comparison: 11/30/2017.

CLINICAL DATA: Shortness of breath beginning today.

EXAM:
PORTABLE CHEST 1 VIEW

[chest ap]
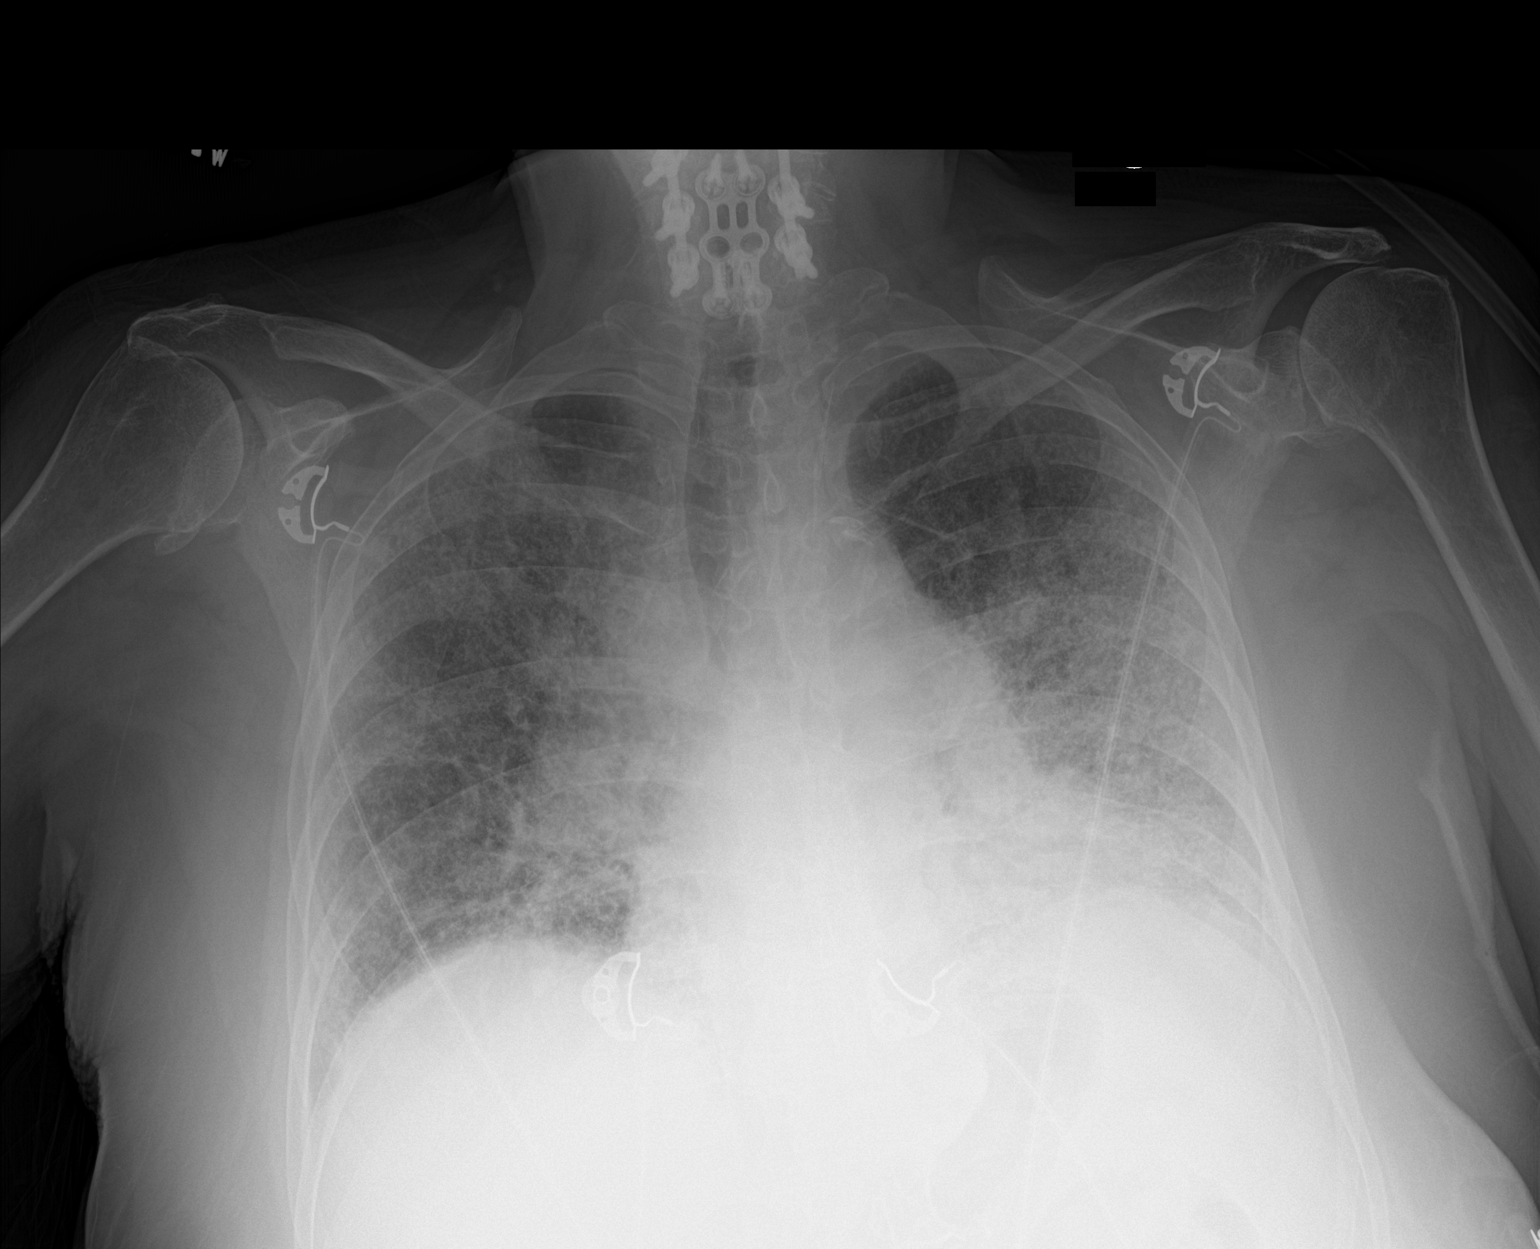

[1 of 1 positions shown; findings below may reference images not displayed]

FINDINGS: Heart size is normal. Chronic aortic atherosclerosis. Background
pattern of chronic interstitial lung disease. Pulmonary opacity is
increased diffusely, more on the left than the right. This could be
due to superimposed edema or pneumonia. No dense consolidation,
lobar collapse or visible effusion.
IMPRESSION: Background chronic interstitial lung disease. Increased interstitial
density left more than right could be due to superimposed edema or
pneumonia.

## 2019-10-06 IMAGING — DX DG CHEST 1V PORT
1 series · 1 of 1 positions shown · non-contrast
Comparison: Multiple priors, including chest radiograph dated
01/21/2018 and CT chest dated 01/19/2018.

CLINICAL DATA: CHF

EXAM:
PORTABLE CHEST 1 VIEW

[chest ap]
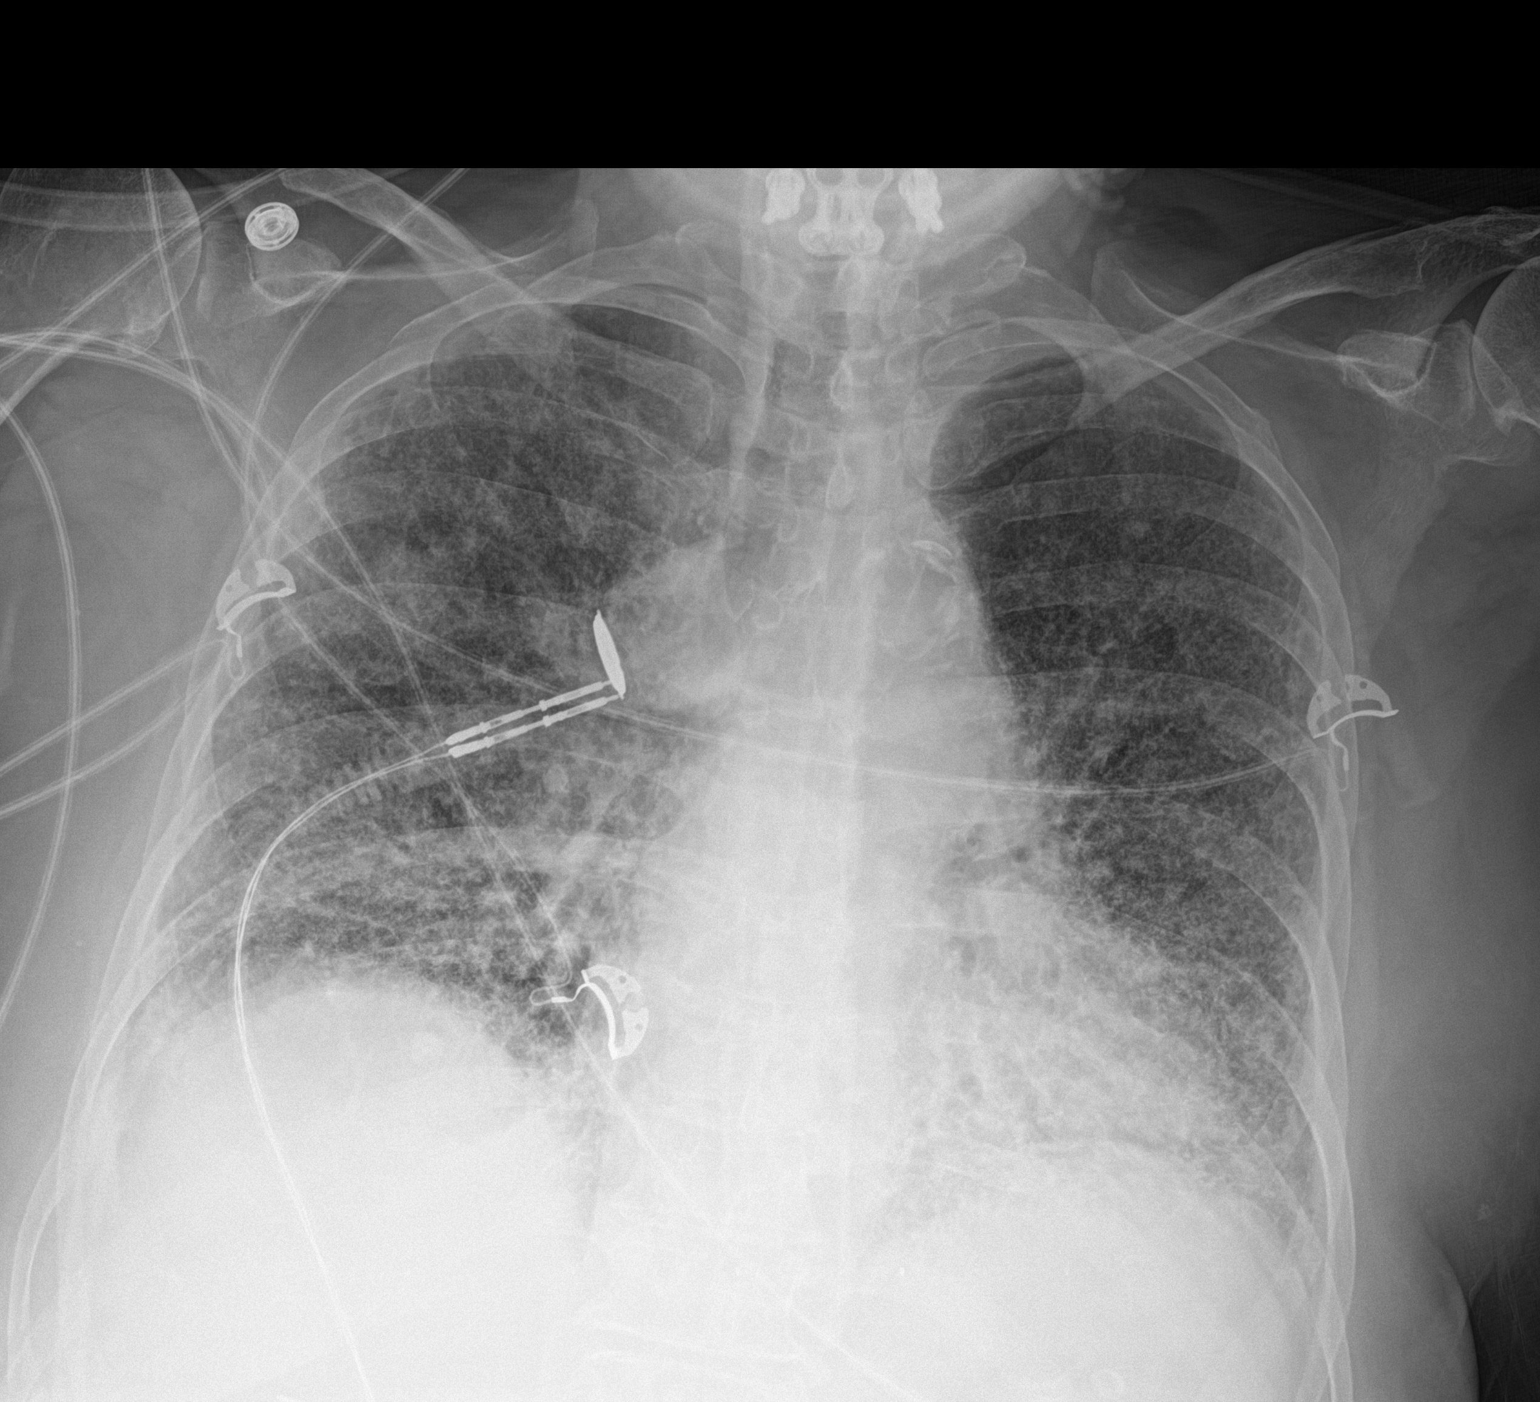

[1 of 1 positions shown; findings below may reference images not displayed]

FINDINGS: Multifocal patchy/interstitial opacities, right lung greater than
left, although left lower lobe predominant. The patient has known
underlying chronic shin lung disease with subpleural
reticulation/fibrosis. Superimposed pneumonia is suspected,
particularly in the right upper lobe. Interstitial edema is
considered less likely given the pattern.

No definite pleural effusions.

The heart is normal in size.

Cervical spine fixation hardware.
IMPRESSION: Chronic interstitial lung disease with suspected superimposed patchy
opacities/pneumonia in the right upper lobe.

## 2019-10-09 IMAGING — CR DG CHEST 2V
2 series · 2 of 2 positions shown · non-contrast
Comparison: 01/23/2018, 04/18/2017

CLINICAL DATA: Pulmonary fibrosis

EXAM:
CHEST - 2 VIEW

[chest pa]
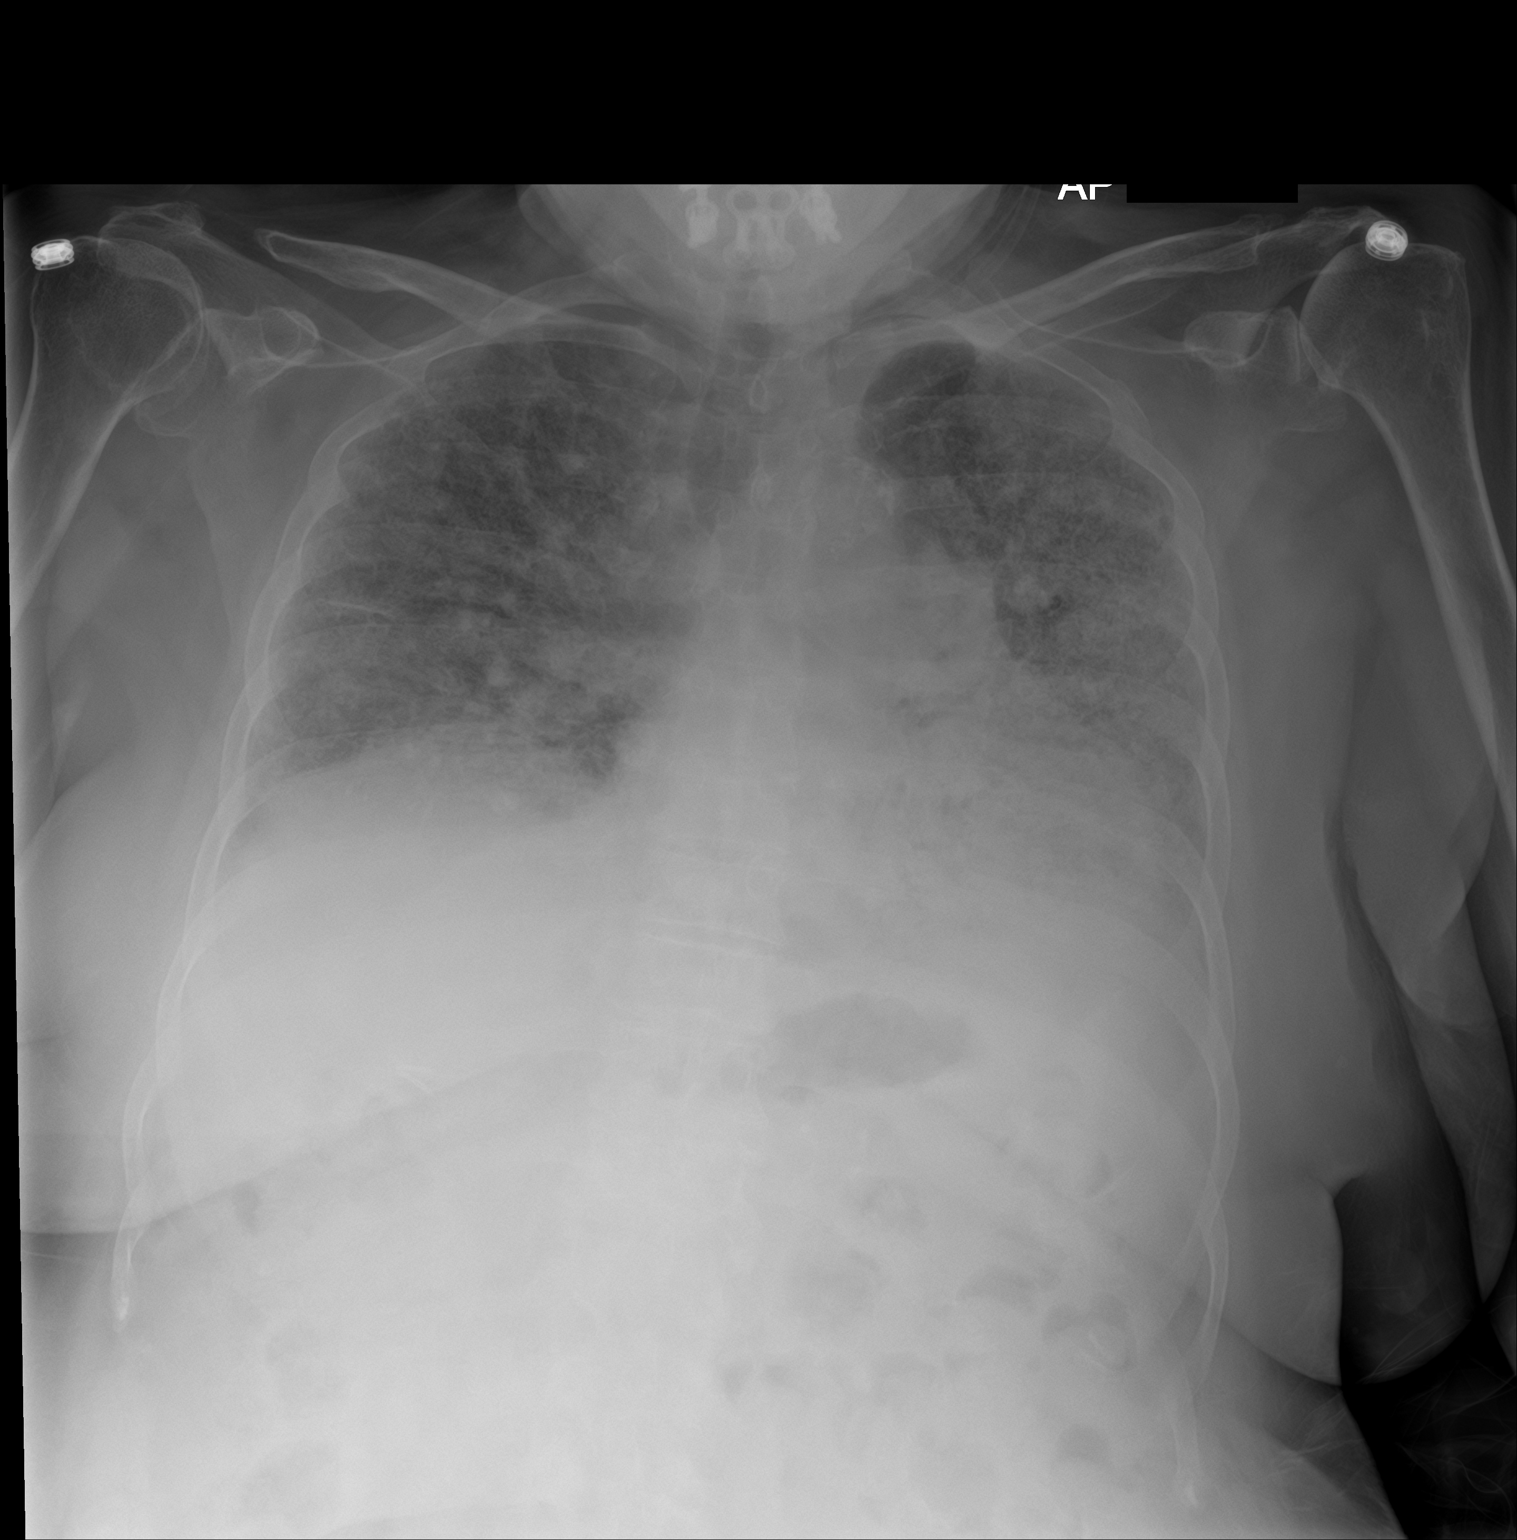

[chest lat]
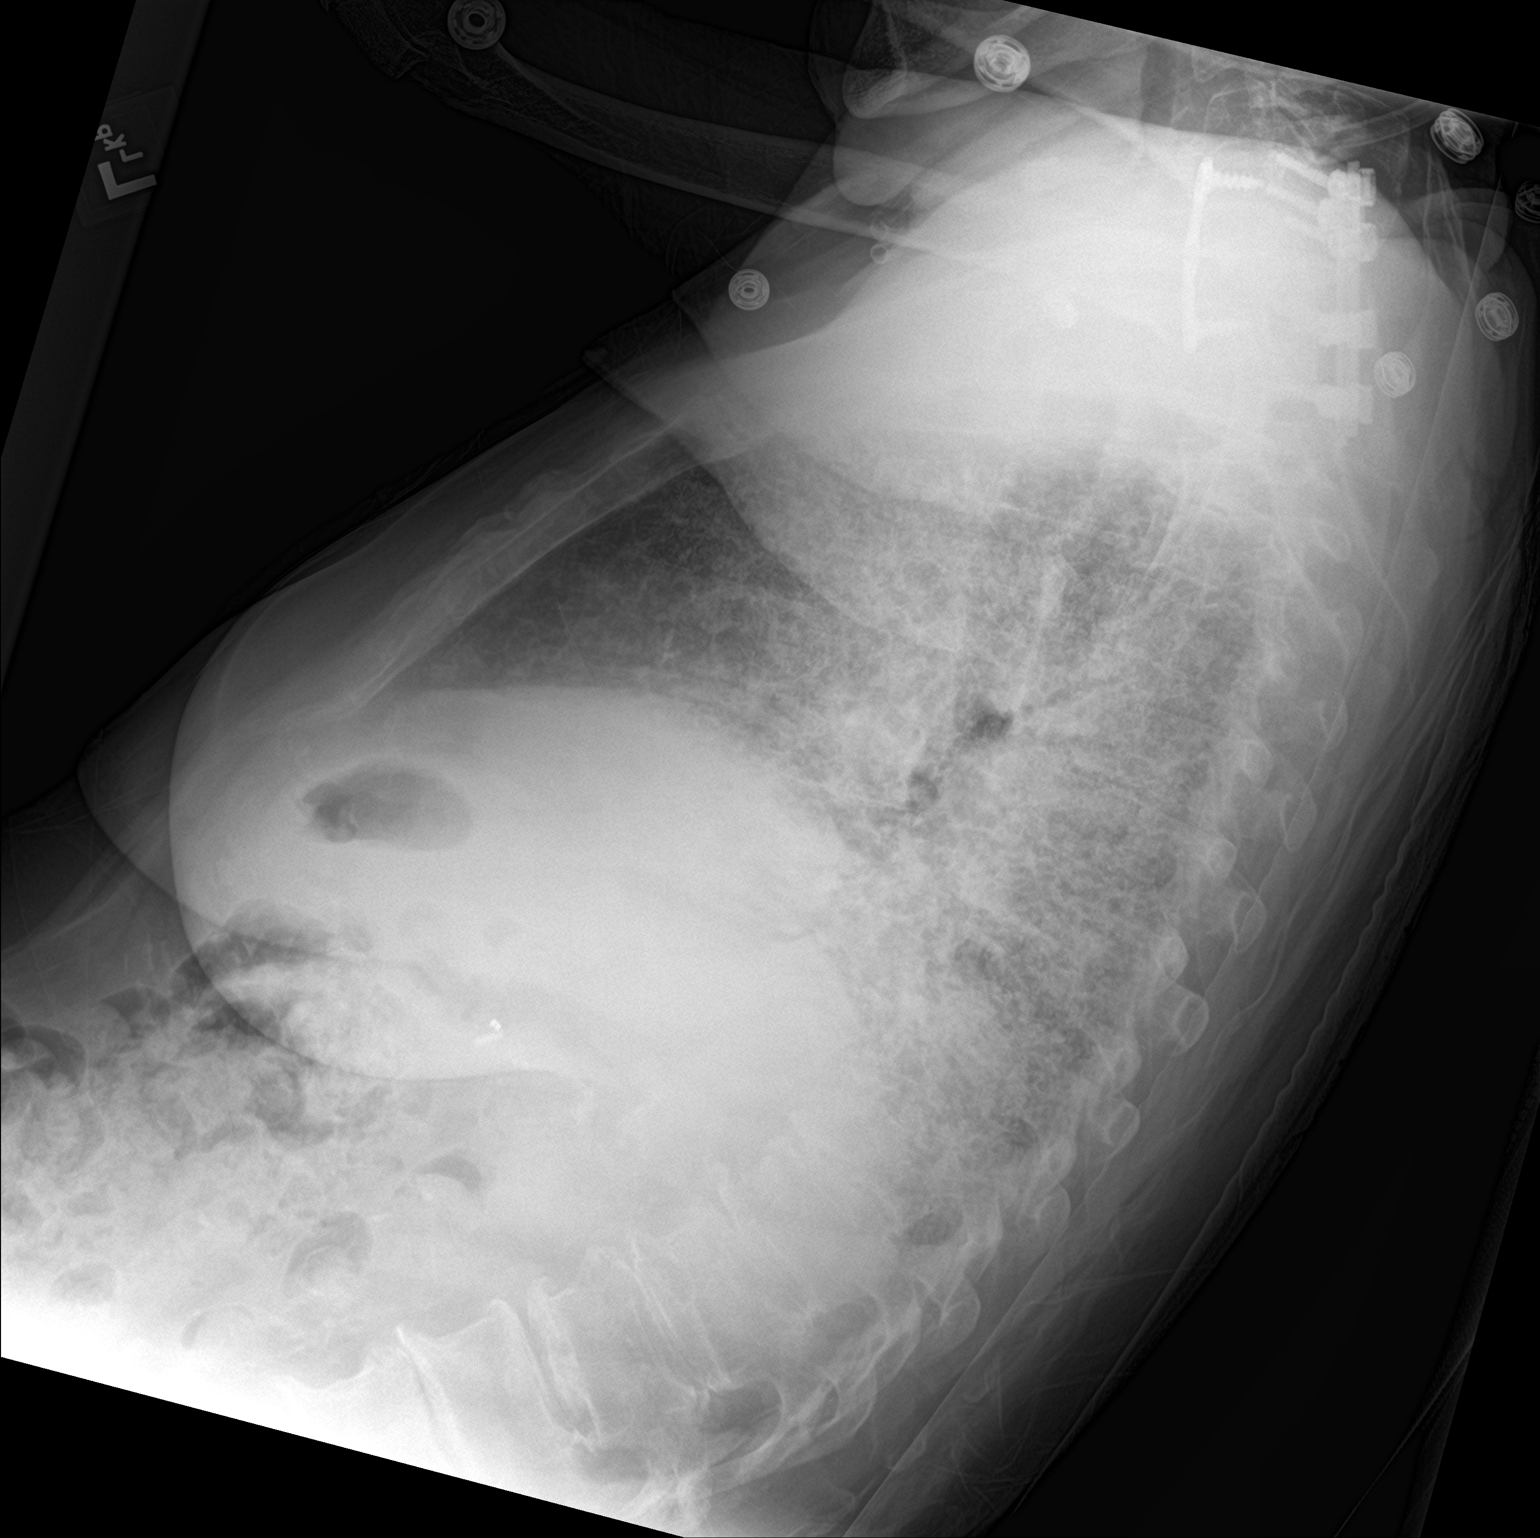

[2 of 2 positions shown; findings below may reference images not displayed]

FINDINGS: Severe chronic interstitial fibrosis as noted on prior studies.
Progression of diffuse bilateral airspace disease compared with
prior studies. This may represent superimposed edema or infection.
Hypoventilation with bibasilar atelectasis. No significant effusion
IMPRESSION: Severe interstitial fibrosis. Hypoventilation with bibasilar
atelectasis

Superimposed bilateral edema/infiltrate.
